# Patient Record
Sex: Female | Born: 1990 | Hispanic: Yes | Marital: Single | Smoking: Never smoker
Health system: Southern US, Community
[De-identification: ages and names within clinical notes are randomized; demographics above are authoritative.]

## PROBLEM LIST (undated history)

## (undated) ENCOUNTER — Inpatient Hospital Stay (HOSPITAL_COMMUNITY): Payer: Self-pay

## (undated) DIAGNOSIS — Z789 Other specified health status: Secondary | ICD-10-CM

## (undated) DIAGNOSIS — K602 Anal fissure, unspecified: Secondary | ICD-10-CM

## (undated) DIAGNOSIS — Z3490 Encounter for supervision of normal pregnancy, unspecified, unspecified trimester: Secondary | ICD-10-CM

## (undated) DIAGNOSIS — R87619 Unspecified abnormal cytological findings in specimens from cervix uteri: Secondary | ICD-10-CM

## (undated) HISTORY — DX: Encounter for supervision of normal pregnancy, unspecified, unspecified trimester: Z34.90

## (undated) HISTORY — PX: NO PAST SURGERIES: SHX2092

## (undated) HISTORY — DX: Unspecified abnormal cytological findings in specimens from cervix uteri: R87.619

## (undated) HISTORY — DX: Anal fissure, unspecified: K60.2

## (undated) HISTORY — DX: Other specified health status: Z78.9

---
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
## 2009-05-18 ENCOUNTER — Emergency Department (HOSPITAL_COMMUNITY): Admission: EM | Admit: 2009-05-18 | Discharge: 2009-05-18 | Payer: Self-pay | Admitting: Emergency Medicine

## 2011-03-08 HISTORY — PX: COLPOSCOPY: SHX161

## 2011-03-25 ENCOUNTER — Other Ambulatory Visit: Payer: Self-pay | Admitting: Nurse Practitioner

## 2011-03-25 ENCOUNTER — Other Ambulatory Visit (HOSPITAL_COMMUNITY)
Admission: RE | Admit: 2011-03-25 | Discharge: 2011-03-25 | Disposition: A | Discharge status: Home / Self Care | Payer: Self-pay | Source: Ambulatory Visit | Attending: Family Medicine | Admitting: Family Medicine

## 2011-03-25 ENCOUNTER — Other Ambulatory Visit (HOSPITAL_COMMUNITY)
Admission: RE | Admit: 2011-03-25 | Discharge: 2011-03-25 | Disposition: A | Discharge status: Home / Self Care | Payer: Self-pay | Source: Ambulatory Visit | Attending: Unknown Physician Specialty | Admitting: Unknown Physician Specialty

## 2011-03-25 DIAGNOSIS — N870 Mild cervical dysplasia: Secondary | ICD-10-CM | POA: Insufficient documentation

## 2011-03-25 DIAGNOSIS — R87612 Low grade squamous intraepithelial lesion on cytologic smear of cervix (LGSIL): Secondary | ICD-10-CM | POA: Insufficient documentation

## 2012-07-04 ENCOUNTER — Encounter (HOSPITAL_COMMUNITY): Payer: Self-pay

## 2012-07-04 ENCOUNTER — Emergency Department (HOSPITAL_COMMUNITY)
Admission: EM | Admit: 2012-07-04 | Discharge: 2012-07-04 | Disposition: A | Discharge status: Home / Self Care | Payer: Self-pay | Attending: Emergency Medicine | Admitting: Emergency Medicine

## 2012-07-04 DIAGNOSIS — O2390 Unspecified genitourinary tract infection in pregnancy, unspecified trimester: Secondary | ICD-10-CM | POA: Insufficient documentation

## 2012-07-04 DIAGNOSIS — N390 Urinary tract infection, site not specified: Secondary | ICD-10-CM | POA: Insufficient documentation

## 2012-07-04 DIAGNOSIS — Z3201 Encounter for pregnancy test, result positive: Secondary | ICD-10-CM | POA: Insufficient documentation

## 2012-07-04 DIAGNOSIS — Z3490 Encounter for supervision of normal pregnancy, unspecified, unspecified trimester: Secondary | ICD-10-CM

## 2012-07-04 HISTORY — DX: Encounter for supervision of normal pregnancy, unspecified, unspecified trimester: Z34.90

## 2012-07-04 LAB — CBC WITH DIFFERENTIAL/PLATELET
Basophils Relative: 0 % (ref 0–1)
Eosinophils Absolute: 0.1 10*3/uL (ref 0.0–0.7)
Lymphocytes Relative: 25 % (ref 12–46)
Lymphs Abs: 1.9 10*3/uL (ref 0.7–4.0)
MCH: 29.8 pg (ref 26.0–34.0)
MCV: 88.9 fL (ref 78.0–100.0)
Neutro Abs: 5.1 10*3/uL (ref 1.7–7.7)
RBC: 3.69 MIL/uL — ABNORMAL LOW (ref 3.87–5.11)

## 2012-07-04 LAB — URINE MICROSCOPIC-ADD ON

## 2012-07-04 LAB — HCG, QUANTITATIVE, PREGNANCY: hCG, Beta Chain, Quant, S: 24118 m[IU]/mL — ABNORMAL HIGH (ref <5)

## 2012-07-04 LAB — BASIC METABOLIC PANEL
Calcium: 8.9 mg/dL (ref 8.4–10.5)
Chloride: 100 mEq/L (ref 96–112)
Creatinine, Ser: 0.52 mg/dL (ref 0.50–1.10)
GFR calc Af Amer: >90 mL/min (ref >90)
GFR calc non Af Amer: >90 mL/min (ref >90)
Potassium: 3.2 mEq/L — ABNORMAL LOW (ref 3.5–5.1)

## 2012-07-04 LAB — URINALYSIS, ROUTINE W REFLEX MICROSCOPIC
Bilirubin Urine: NEGATIVE
Ketones, ur: NEGATIVE mg/dL

## 2012-07-04 MED ORDER — CEPHALEXIN 500 MG PO CAPS: 500.0000 mg | ORAL_CAPSULE | Freq: Four times a day (QID) | ORAL | Status: DC

## 2012-07-04 MED ORDER — CEPHALEXIN 500 MG PO CAPS
1000.0000 mg | ORAL_CAPSULE | Freq: Once | ORAL | Status: AC
  Administered 2012-07-04: 1000 mg via ORAL
  Filled 2012-07-04: qty 2

## 2012-07-04 MED ORDER — MAGNESIUM CITRATE PO SOLN
1.0000 | Freq: Once | ORAL | Status: AC
  Administered 2012-07-04: 1 via ORAL
  Filled 2012-07-04: qty 296

## 2012-07-04 NOTE — ED Provider Notes (Signed)
History   This chart was scribed for Donnetta Hutching, MD by Leone Payor, ED Scribe. This patient was seen in room APA02/APA02 and the patient's care was started at 1922.   CSN: [id number]  Arrival date & time 07/04/12  1531   First MD Initiated Contact with Patient 07/04/12 1922      Chief Complaint  Patient presents with  . Constipation    (Consider location/radiation/quality/duration/timing/severity/associated sxs/prior treatment) The history is provided by the patient. No language interpreter was used.    Samantha Khan is a pregnany 21 y.o. female who presents to the Emergency Department complaining of new constipation starting 3 days ago. Last menstrual period 04/13 and she reports taking the morning after pill 08/13 after a sexual encounter. Pt reports gaining 8-9 lbs recently. Health department recently told pt that she was pregnant. Pt has pain in her right lower abdomen. She denies dysuria, fever, chills   Pt denies smoking and alcohol use. Past Medical History  Diagnosis Date  . Pregnancy     History reviewed. No pertinent past surgical history.  No family history on file.  History  Substance Use Topics  . Smoking status: Never Smoker   . Smokeless tobacco: Not on file  . Alcohol Use: No    OB History    Grav Para Term Preterm Abortions TAB SAB Ect Mult Living   1               Review of Systems  A complete 10 system review of systems was obtained and all systems are negative except as noted in the HPI and PMH.    Allergies  Review of patient's allergies indicates no known allergies.  Home Medications  No current outpatient prescriptions on file.  BP 126/75  Pulse 94  Temp 97.2 F (36.2 C) (Oral)  Resp 24  Ht 5' (1.524 m)  Wt 143 lb (64.864 kg)  BMI 27.93 kg/m2  SpO2 100%  LMP 03/01/2012  Physical Exam  Nursing note and vitals reviewed. Constitutional: She is oriented to person, place, and time. She appears well-developed and well-nourished.   HENT:  Head: Normocephalic and atraumatic.  Eyes: Conjunctivae normal and EOM are normal. Pupils are equal, round, and reactive to light.  Neck: Normal range of motion. Neck supple.  Cardiovascular: Normal rate, regular rhythm and normal heart sounds.   Pulmonary/Chest: Effort normal and breath sounds normal.  Abdominal: Soft. Bowel sounds are normal.  Musculoskeletal: Normal range of motion.  Neurological: She is alert and oriented to person, place, and time.  Skin: Skin is warm and dry.  Psychiatric: She has a normal mood and affect.    ED Course  Procedures (including critical care time)  DIAGNOSTIC STUDIES: Oxygen Saturation is 100% on room air, normal by my interpretation.    COORDINATION OF CARE:  7:26 PM Discussed treatment plan which includes blood work and pregnancy test with pt at bedside and pt agreed to plan.   9:57 PM Advised pt of lab work results. Discussed discharge plan which includes antibiotics and follow up for ultrasound with pt and pt agreed to plan.    Labs Reviewed  URINALYSIS, ROUTINE W REFLEX MICROSCOPIC  PREGNANCY, URINE   Results for orders placed during the hospital encounter of 07/04/12  URINALYSIS, ROUTINE W REFLEX MICROSCOPIC      Component Value Range   Color, Urine YELLOW  YELLOW   APPearance HAZY (*) CLEAR   Specific Gravity, Urine 1.020  1.005 - 1.030   pH 6.0  5.0 - 8.0   Glucose, UA NEGATIVE  NEGATIVE mg/dL   Hgb urine dipstick NEGATIVE  NEGATIVE   Bilirubin Urine NEGATIVE  NEGATIVE   Ketones, ur NEGATIVE  NEGATIVE mg/dL   Protein, ur NEGATIVE  NEGATIVE mg/dL   Urobilinogen, UA 0.2  0.0 - 1.0 mg/dL   Nitrite NEGATIVE  NEGATIVE   Leukocytes, UA SMALL (*) NEGATIVE  PREGNANCY, URINE      Component Value Range   Preg Test, Ur POSITIVE (*) NEGATIVE  URINE MICROSCOPIC-ADD ON      Component Value Range   Squamous Epithelial / LPF MANY (*) RARE   WBC, UA 11-20  <3 WBC/hpf   RBC / HPF 0-2  <3 RBC/hpf   Bacteria, UA MANY (*) RARE    CBC WITH DIFFERENTIAL      Component Value Range   WBC 7.6  4.0 - 10.5 K/uL   RBC 3.69 (*) 3.87 - 5.11 MIL/uL   Hemoglobin 11.0 (*) 12.0 - 15.0 g/dL   HCT 16.1 (*) 09.6 - 04.5 %   MCV 88.9  78.0 - 100.0 fL   MCH 29.8  26.0 - 34.0 pg   MCHC 33.5  30.0 - 36.0 g/dL   RDW 40.9  81.1 - 91.4 %   Platelets 254  150 - 400 K/uL   Neutrophils Relative 66  43 - 77 %   Neutro Abs 5.1  1.7 - 7.7 K/uL   Lymphocytes Relative 25  12 - 46 %   Lymphs Abs 1.9  0.7 - 4.0 K/uL   Monocytes Relative 7  3 - 12 %   Monocytes Absolute 0.5  0.1 - 1.0 K/uL   Eosinophils Relative 2  0 - 5 %   Eosinophils Absolute 0.1  0.0 - 0.7 K/uL   Basophils Relative 0  0 - 1 %   Basophils Absolute 0.0  0.0 - 0.1 K/uL  BASIC METABOLIC PANEL      Component Value Range   Sodium 133 (*) 135 - 145 mEq/L   Potassium 3.2 (*) 3.5 - 5.1 mEq/L   Chloride 100  96 - 112 mEq/L   CO2 24  19 - 32 mEq/L   Glucose, Bld 89  70 - 99 mg/dL   BUN 8  6 - 23 mg/dL   Creatinine, Ser 7.82  0.50 - 1.10 mg/dL   Calcium 8.9  8.4 - 95.6 mg/dL   GFR calc non Af Amer >90  >90 mL/min   GFR calc Af Amer >90  >90 mL/min  HCG, QUANTITATIVE, PREGNANCY      Component Value Range   hCG, Beta Chain, Quant, S 24118 (*) <5 mIU/mL   No results found.   No diagnosis found.    MDM  No acute abdomen.  Urinalysis reveals 11-20 white cells. Rx Keflex for one week. Pregnancy test positive. we'll obtain ultrasound Monday 3:15. No clinical evidence of ectopic.  Magnesium citrate for constipation      I personally performed the services described in this documentation, which was scribed in my presence. The recorded information has been reviewed and is accurate.    Donnetta Hutching, MD 07/04/12 2251

## 2012-07-04 NOTE — ED Notes (Signed)
Ultrasound appointment for 1515 tomorrow.

## 2012-07-04 NOTE — ED Notes (Signed)
Pt reports no bm x3 days. Pt is also pregnant. Unsure of her due date, just found out 1 week ago and stopped taking her laxatives.

## 2012-07-05 ENCOUNTER — Other Ambulatory Visit (HOSPITAL_COMMUNITY): Payer: Self-pay | Admitting: Emergency Medicine

## 2012-07-05 ENCOUNTER — Ambulatory Visit (HOSPITAL_COMMUNITY)
Admit: 2012-07-05 | Discharge: 2012-07-05 | Disposition: A | Discharge status: Home / Self Care | Payer: Self-pay | Attending: Emergency Medicine | Admitting: Emergency Medicine

## 2012-07-05 DIAGNOSIS — Z3490 Encounter for supervision of normal pregnancy, unspecified, unspecified trimester: Secondary | ICD-10-CM

## 2012-07-05 DIAGNOSIS — Z331 Pregnant state, incidental: Secondary | ICD-10-CM | POA: Insufficient documentation

## 2012-07-05 LAB — URINE CULTURE
Colony Count: NO GROWTH
Culture: NO GROWTH

## 2012-07-07 NOTE — L&D Delivery Note (Signed)
Delivery Note At 1:07 AM a viable and healthy female was delivered via Vaginal, Spontaneous Delivery (Presentation: ; Occiput Anterior).  APGAR: 8, 9; weight 8 lb 14 oz (4026 g).   Placenta status: Intact, Spontaneous.  Cord: 3 vessels with the following complications: None.   Anesthesia: Epidural  Episiotomy: None Lacerations: 2nd degree;Perineal Suture Repair: 3.0 vicryl Est. Blood Loss (mL):   Mom to postpartum.  Baby to nursery-stable.  Tawnya Crook 12/02/2012, 1:34 AM

## 2012-07-20 LAB — OB RESULTS CONSOLE ANTIBODY SCREEN: Antibody Screen: NEGATIVE

## 2012-07-20 LAB — OB RESULTS CONSOLE ABO/RH: RH Type: POSITIVE

## 2012-07-20 LAB — OB RESULTS CONSOLE RUBELLA ANTIBODY, IGM: Rubella: IMMUNE

## 2012-07-20 LAB — OB RESULTS CONSOLE GC/CHLAMYDIA: Gonorrhea: NEGATIVE

## 2012-07-20 LAB — OB RESULTS CONSOLE HEPATITIS B SURFACE ANTIGEN: Hepatitis B Surface Ag: NEGATIVE

## 2012-09-14 ENCOUNTER — Encounter: Payer: Self-pay | Admitting: *Deleted

## 2012-09-21 ENCOUNTER — Ambulatory Visit (INDEPENDENT_AMBULATORY_CARE_PROVIDER_SITE_OTHER): Payer: Self-pay | Admitting: Advanced Practice Midwife

## 2012-09-21 ENCOUNTER — Encounter: Payer: Self-pay | Admitting: Advanced Practice Midwife

## 2012-09-21 VITALS — BP 94/50 | Wt 163.6 lb

## 2012-09-21 DIAGNOSIS — O0930 Supervision of pregnancy with insufficient antenatal care, unspecified trimester: Secondary | ICD-10-CM

## 2012-09-21 DIAGNOSIS — O0932 Supervision of pregnancy with insufficient antenatal care, second trimester: Secondary | ICD-10-CM

## 2012-09-21 DIAGNOSIS — O99019 Anemia complicating pregnancy, unspecified trimester: Secondary | ICD-10-CM

## 2012-09-21 DIAGNOSIS — Z3492 Encounter for supervision of normal pregnancy, unspecified, second trimester: Secondary | ICD-10-CM

## 2012-09-21 LAB — GLUCOSE TOLERANCE, 2 HOURS
Glucose, GTT - 1 Hour: 123 mg/dL (ref ?–200)
Glucose, GTT - 2 Hour: 128 mg/dL (ref ?–140)

## 2012-09-21 LAB — POCT URINALYSIS DIPSTICK

## 2012-09-21 NOTE — Progress Notes (Signed)
Pt reports good movement.  No c/o at this time.  Routine questions about pregnancy andswered.  F/U in 2 weeks for Normal OBV.

## 2012-10-05 ENCOUNTER — Encounter: Payer: Self-pay | Admitting: Advanced Practice Midwife

## 2012-10-05 ENCOUNTER — Ambulatory Visit (INDEPENDENT_AMBULATORY_CARE_PROVIDER_SITE_OTHER): Payer: Self-pay | Admitting: Advanced Practice Midwife

## 2012-10-05 VITALS — BP 90/58 | Wt 165.6 lb

## 2012-10-05 DIAGNOSIS — Z3400 Encounter for supervision of normal first pregnancy, unspecified trimester: Secondary | ICD-10-CM | POA: Insufficient documentation

## 2012-10-05 DIAGNOSIS — Z3403 Encounter for supervision of normal first pregnancy, third trimester: Secondary | ICD-10-CM

## 2012-10-05 DIAGNOSIS — O0930 Supervision of pregnancy with insufficient antenatal care, unspecified trimester: Secondary | ICD-10-CM

## 2012-10-05 NOTE — Progress Notes (Signed)
Unable to void for urine specimen.     No c/o at this time.  Routine questions about pregnancy answered.  F/U in 2 weeks for routing prenatal visit. Requests more prenatal vitamin samples.

## 2012-10-05 NOTE — Progress Notes (Signed)
Pressure in belly when baby moves.

## 2012-10-07 ENCOUNTER — Ambulatory Visit (INDEPENDENT_AMBULATORY_CARE_PROVIDER_SITE_OTHER): Payer: Self-pay | Admitting: Obstetrics & Gynecology

## 2012-10-07 ENCOUNTER — Telehealth: Payer: Self-pay | Admitting: Obstetrics & Gynecology

## 2012-10-07 ENCOUNTER — Encounter: Payer: Self-pay | Admitting: Advanced Practice Midwife

## 2012-10-07 VITALS — BP 102/60 | Wt 165.2 lb

## 2012-10-07 DIAGNOSIS — O4690 Antepartum hemorrhage, unspecified, unspecified trimester: Secondary | ICD-10-CM

## 2012-10-07 DIAGNOSIS — O4693 Antepartum hemorrhage, unspecified, third trimester: Secondary | ICD-10-CM

## 2012-10-07 DIAGNOSIS — O2390 Unspecified genitourinary tract infection in pregnancy, unspecified trimester: Secondary | ICD-10-CM

## 2012-10-07 DIAGNOSIS — B3731 Acute candidiasis of vulva and vagina: Secondary | ICD-10-CM

## 2012-10-07 DIAGNOSIS — B373 Candidiasis of vulva and vagina: Secondary | ICD-10-CM

## 2012-10-07 LAB — POCT URINALYSIS DIPSTICK
Glucose, UA: NEGATIVE
Ketones, UA: NEGATIVE
Nitrite, UA: NEGATIVE

## 2012-10-07 MED ORDER — FLUCONAZOLE 150 MG PO TABS: 150.0000 mg | ORAL_TABLET | Freq: Once | ORAL | Status: DC

## 2012-10-07 NOTE — Progress Notes (Signed)
Pt had pink spotting yesterday and this am, now brown.  Denies vaginal itiching/irritation.  SSE:  Scant brown blood; discharge c/w yeast infection.  CX LTC.  WIll treat with Diflucan.

## 2012-10-07 NOTE — Progress Notes (Signed)
Vaginal bleeding started yest. afternoon. Started out red, but today it's brown. No recent BM or intercourse.

## 2012-10-07 NOTE — Patient Instructions (Signed)
Infeccin por cndida en adultos (Candida Infection, Adult) Una infeccin por Cndida (tambin llamado infeccin por hongos levaduriformes, o infeccin por Monilia) es un crecimiento excesivo de hongos que puede ocurrir en cualquier parte del cuerpo. Una infeccin por cndida comnmente ocurre en las zonas ms calientes y hmedas del cuerpo. Usualmente, la infeccin permanece localizada pero puede diseminarse y convertirse en una infeccin sistmica. Una infeccin por cndida puede ser signo de un trastorno ms grave como diabetes, leucemia, o SIDA. Una infeccin por cndida puede ocurrir tanto en hombres como en mujeres. En mujeres, la vaginitis Cndida es una infeccin vaginal. Se trata de una de las causas ms frecuentes de la vaginitis. Los hombres no suelen tener sntomas [place] que se le aparecen otros problemas. Pueden descubrir que tienen una infeccin por hongos porque su compaera sexual los tiene. Es ms probable que los hombres no circuncisos adquieran una infeccin por hongo que aquellos que estn circuncindados. Esto se debe a que el glande no circuncidado no est expuesto al aire y no se mantiene tan seco como un glande circuncidado. Los ONEOKadultos mayores pueden desarrollar infecciones por cndida en la zona de la dentadura. CAUSAS Mujeres  Antibiticos.  Medicamento con esteroides Network engineerdurante mucho tiempo.  Tener sobrepeso (obesidad).  Diabetes.  Sistema inmune deficiente.  Ciertas enfermedades.  Medicamentos inmunosupresores para pacientes con trasplante de rganos.  Quimioterapia.  El Morris Plainsembarazo.  Menstruacin.  Estrs o fatiga.  Consumo de drogas de forma intravenosa.  Anticonceptivos orales.  Utilizar ropa ajustada en la zona de la entrepierna.  Contagio a partir de un compaero sexual que ya tiene la infeccin.  Los espermicidas.  Catteres intravenosos, urinarios, u de otro tipo. Hombres  Contagio a partir de una compaera sexual que ya tiene la  infeccin.  Tener sexo oral o anal con una persona que tiene la infeccin.  Los espermicidas.  Diabetes.  Antibiticos.  Sistema inmune deficiente.  Medicamentos que suprimen el sistema inmune.  El uso de drogas por va intravenosa.  Intravenosa, urinarias o catteres otros. SNTOMAS Mujeres  Flujo vaginal espeso y blanco.  Picazn vaginal.  Enrojecimiento e hinchazn en la zona de la vagina.  Irritacin de los labios de la vagina y perineo.  lceras en labios vaginales y perineo.  Relaciones sexuales dolorosas.  Bajo nivel de Bankerazcar en la sangre (hipoglucemia).  Dolor al Beatrix Shipperorinar.  Infecciones en la vejiga.  Problemas intestinales como constipacin, indigestin, mal aliento, hinchazn, gases, diarrea o heces blandas. Hombres  Primero los hombres desarrollan problemas intestinales como constipacin, indigestin, mal aliento, hinchazn, gases, diarrea o heces blandas.  Piel del pene seca y Sudanquebradiza con picazn o molestias.  Prurito de Estate agentla ingle.  Piel seca y escamosa.  Pie de atleta.  Hipoglucemia. DIAGNSTICO Mujeres  Se revisa el historial y se realiza un anlisis.  La supuracin se observa con un microscopio.  Deber tomarse un cultivo de Administrator, artsla secrecin. Hombres  Se revisa el historial y se realiza un anlisis.  Se observar cualquier secrecin proveniente del pene y la piel quebrada en el microscopio y se Education officer, environmentalrealizar un cultivo.  Podrn realizarle cultivos de heces. TRATAMIENTO Mujeres  Supositorios y cremas antifngicos vaginales.  Cremas medicadas para disminuir la picazn e irritacin de la zona externa de la vagina.  Aplique una bolsa caliente en la zona del perineo para disminuir molestias o inflamacin.  Medicamentos antifngicos orales.  Supositorios vaginales o cremas medicinales, para infecciones repetidas o recurrentes.  Lave y seque la zona por completo antes de Comptrolleraplicar la crema.  La  ingesta de yogur con lactobacillus le  ayudar con la prevencin y Scientist, research (medical).  Si las cremas y supositorios no funcionan, la aplicacin de solucin violeta de genciana puede ayudar. Hombres  Cremas anti hongos y medicamentos orales anti hongos.  A veces el tratamiento deber continuar por 30 das despus de que los sntomas desaparecen para prevenir la recurrencia. INSTRUCCIONES PARA EL CUIDADO DOMICILIARIO Mujeres  Utilice ropa interior de algodn y evite las ropas ajustadas.  Evite el papel higinico de color o perfumado y los tampones o toallitas con desodorante.  No utilice duchas vaginales.  Mantenga la diabetes bajo control.  Tome todos los medicamentos tal como se le indic.  Mantenga su piel limpia y [place].  Consuma leche o yogur con lactobacillus de [place] regular. Si contrae infecciones por levaduras frecuentes y cree saber cul es la infeccin, existen medicamentos de [place]. Si la infeccin no parece curarse en 3 das, hable con el mdico.  Comunique a su compaero sexual que padece una infeccin por hongos. El tambin Network engineer, en especial si la infeccin no desaparece o es recurrente. Hombres  Mantenga su piel limpia y [place].  Mantenga la diabetes bajo control.  Tome todos los medicamentos tal como se le indic.  Comunique a su compaera sexual que sufre una infeccin por hongos. SOLICITE ANTENCIN MDICA SI:  Los sntomas no desaparecen o empeoran luego de 1 semana de tratamiento.  Usted tiene una temperatura oral de ms de 102 F (38.9 C).  Presenta dificultades para tragar o para comer durante un tiempo prolongado.  Aparecen ampollas en los genitales.  Si aparece una hemorragia vaginal y no es el momento del perodo.  Siente dolor abdominal.  Presentara problemas intestinales como los ya descritos.  Si se siente dbil o desfalleciente.  Siente dolor al Geographical information systems officer u observa una mayor cantidad [place].  Tiene dolor durante las [employer]. ASEGRESE DE  QUE:  Comprende esas instrucciones para el alta mdica.  Controlar su enfermedad.  Pedir ayuda de inmediato si no mejora o empeora. Document Released: 06/23/2005 Document Revised: 09/15/2011 [facility] Patient Information 2013 [place], [place].

## 2012-10-20 ENCOUNTER — Encounter: Payer: Self-pay | Admitting: Advanced Practice Midwife

## 2012-10-20 ENCOUNTER — Ambulatory Visit (INDEPENDENT_AMBULATORY_CARE_PROVIDER_SITE_OTHER): Payer: Self-pay | Admitting: Advanced Practice Midwife

## 2012-10-20 VITALS — BP 108/68 | Wt 170.0 lb

## 2012-10-20 DIAGNOSIS — Z3400 Encounter for supervision of normal first pregnancy, unspecified trimester: Secondary | ICD-10-CM

## 2012-10-20 DIAGNOSIS — O99019 Anemia complicating pregnancy, unspecified trimester: Secondary | ICD-10-CM

## 2012-10-20 LAB — POCT URINALYSIS DIPSTICK
Blood, UA: 1
Glucose, UA: NEGATIVE
Nitrite, UA: NEGATIVE

## 2012-10-20 NOTE — Progress Notes (Signed)
No c/o at this time.  Routine questions about pregnancy answered.  F/U in 1.5 weeks for LROB and GBS.

## 2012-11-02 ENCOUNTER — Ambulatory Visit (INDEPENDENT_AMBULATORY_CARE_PROVIDER_SITE_OTHER): Payer: Self-pay | Admitting: Women's Health

## 2012-11-02 ENCOUNTER — Encounter: Payer: Self-pay | Admitting: Women's Health

## 2012-11-02 VITALS — BP 100/50 | Wt 169.6 lb

## 2012-11-02 DIAGNOSIS — O0930 Supervision of pregnancy with insufficient antenatal care, unspecified trimester: Secondary | ICD-10-CM

## 2012-11-02 DIAGNOSIS — Z3403 Encounter for supervision of normal first pregnancy, third trimester: Secondary | ICD-10-CM

## 2012-11-02 DIAGNOSIS — Z331 Pregnant state, incidental: Secondary | ICD-10-CM

## 2012-11-02 DIAGNOSIS — Z1389 Encounter for screening for other disorder: Secondary | ICD-10-CM

## 2012-11-02 LAB — POCT URINALYSIS DIPSTICK
Glucose, UA: NEGATIVE
Nitrite, UA: NEGATIVE
Protein, UA: NEGATIVE

## 2012-11-02 NOTE — Addendum Note (Signed)
Addended by: Colen Darling on: 11/02/2012 03:49 PM   Modules accepted: Orders

## 2012-11-02 NOTE — Progress Notes (Signed)
Reports good fm. Denies uc's, lof, vb, urinary frequency, urgency, hesitancy, or dysuria.  Reports occasional pain in pelvis w/ fetal movement.  Reassured pt. Reviewed labor s/s and fetal kick counts. GBS and gc/ch collected today. All questions answered. F/u 1 wk for LROB

## 2012-11-02 NOTE — Progress Notes (Signed)
Pain in low belly.

## 2012-11-02 NOTE — Patient Instructions (Signed)
Contracciones de Braxton Hicks (Braxton Hicks Contractions) Usted presenta un falso trabajo de parto. Durante todo el embarazo aparecen con frecuencia contracciones del tero. Hacia el final del embarazo (32-34 semanas) estas contracciones (Braxton Hicks) pueden hacerse ms fuertes. No se trata de un trabajo de parto verdadero porque no producen un agrandamiento (dilatacin) y afinamiento del cuello del tero. Algunas veces resulta difcil distinguirlas del trabajo de parto verdadero porque en algunos casos llegan a ser muy intensas y las personas tienen distinta tolerancia al dolor. No debe sentirse avergonzada si ingresa al hospital con un falso trabajo de parto. En ocasiones la nica forma de saber si est en un parto verdadero es observar los cambios en el cuello del tero. A veces, la nica forma de saber si realmente est en trabajo de parto es para el mdico observar los cambios en el tero. Como diferenciar el trabajo de parto falso del verdadero:  Trabajos de parto falso.  Las contracciones falsas generalmente duran menos y no son tan intensas como las verdaderas.  Generalmente se sienten en la zona inferior del abdomen y en la ingle.  Pueden aliviarse con una caminata o cambiar de posicin mientras se est acostada.  A medida que pasa el tiempo son ms cortas y dbiles.  Generalmente son irregulares.  No se hacen progresivamente ms intensas y cercanas entre s como las verdaderas.  Trabajo de parto verdadero.  Las contracciones verdaderas duran de 30 a 70 segundos, son ms regulares, generalmente se hacen ms intensas y aumentan en frecuencia.  No desaparecen al caminar.  La molestia generalmente se siente en la parte superior del tero y se extiende hacia la zona inferior del abdomen y hacia la cintura.  El profesional que la asiste podr examinarla para determinar si el trabajo de parto es verdadero. El examen mostrar si el cuello del tero se est dilatando y afinando. Si  no hay problemas prenatales u otras complicaciones de la salud asociadas al embarazo, no habr inconvenientes si la envan a su casa y espera el comienzo del verdadero trabajo de parto. INSTRUCCIONES PARA EL CUIDADO DOMICILIARIO  Siga con los ejercicios y las indicaciones habituales.  Tome los medicamentos como se le indic.  Cumpla con las citas regularmente.  Coma y beba ligero si cree que dar a luz.  Si se siente incmoda por las contracciones:  Cambie de actividad, si est acostada o en reposo, camine y si est caminando, repose.  Sintense y repose en una baadera con agua caliente.  Beba entre 2 y 3 vasos de agua. La deshidratacin puede causar contracciones BH.  Respire lenta y profundamente varias veces por hora. SOLICITE ATENCIN MDICA DE INMEDIATO SI:  Las contracciones se intensifican, se hacen ms regulares y cercanas entre s.  Tiene una prdida importante de lquido de la vagina  La temperatura oral se eleva sin motivo por encima de 102 F (38.9 C) o segn le indique el profesional que la asiste.  Elimina una mucosidad sanguinolenta.  Presenta hemorragia vaginal.  Presenta dolor abdominal constante.  Siente un dolor en la parte baja de la espalda que nunca haba sentido antes.  Siente que el beb empuja hacia abajo y le causa presin plvica.  El beb no se mueve tanto como antes. Document Released: 04/02/2005 Document Revised: 09/15/2011 Exit[employer] Patient Information 2013 [employer].  

## 2012-11-03 LAB — GC/CHLAMYDIA PROBE AMP
CT Probe RNA: NEGATIVE
GC Probe RNA: NEGATIVE

## 2012-11-09 ENCOUNTER — Encounter: Payer: Self-pay | Admitting: Women's Health

## 2012-11-09 ENCOUNTER — Ambulatory Visit (INDEPENDENT_AMBULATORY_CARE_PROVIDER_SITE_OTHER): Payer: Self-pay | Admitting: Women's Health

## 2012-11-09 VITALS — BP 112/62 | Wt 168.8 lb

## 2012-11-09 DIAGNOSIS — Z331 Pregnant state, incidental: Secondary | ICD-10-CM

## 2012-11-09 DIAGNOSIS — Z3403 Encounter for supervision of normal first pregnancy, third trimester: Secondary | ICD-10-CM

## 2012-11-09 DIAGNOSIS — O0930 Supervision of pregnancy with insufficient antenatal care, unspecified trimester: Secondary | ICD-10-CM

## 2012-11-09 DIAGNOSIS — Z1389 Encounter for screening for other disorder: Secondary | ICD-10-CM

## 2012-11-09 LAB — POCT URINALYSIS DIPSTICK
Ketones, UA: NEGATIVE
Protein, UA: NEGATIVE

## 2012-11-09 NOTE — Progress Notes (Signed)
Pain and pressure in lower belly.

## 2012-11-09 NOTE — Patient Instructions (Addendum)
Braxton Hicks Contractions  Pregnancy is commonly associated with contractions of the uterus throughout the pregnancy. Towards the end of pregnancy (32 to 34 weeks), these contractions (Braxton Hicks) can develop more often and may become more forceful. This is not true labor because these contractions do not result in opening (dilatation) and thinning of the cervix. They are sometimes difficult to tell apart from true labor because these contractions can be forceful and people have different pain tolerances. You should not feel embarrassed if you go to the hospital with false labor. Sometimes, the only way to tell if you are in true labor is for your caregiver to follow the changes in the cervix.  How to tell the difference between true and false labor:  · False labor.  · The contractions of false labor are usually shorter, irregular and not as hard as those of true labor.  · They are often felt in the front of the lower abdomen and in the groin.  · They may leave with walking around or changing positions while lying down.  · They get weaker and are shorter lasting as time goes on.  · These contractions are usually irregular.  · They do not usually become progressively stronger, regular and closer together as with true labor.  · True labor.  · Contractions in true labor last 30 to 70 seconds, become very regular, usually become more intense, and increase in frequency.  · They do not go away with walking.  · The discomfort is usually felt in the top of the uterus and spreads to the lower abdomen and low back.  · True labor can be determined by your caregiver with an exam. This will show that the cervix is dilating and getting thinner.  If there are no prenatal problems or other health problems associated with the pregnancy, it is completely safe to be sent home with false labor and await the onset of true labor.  HOME CARE INSTRUCTIONS   · Keep up with your usual exercises and instructions.  · Take medications as  directed.  · Keep your regular prenatal appointment.  · Eat and drink lightly if you think you are going into labor.  · If BH contractions are making you uncomfortable:  · Change your activity position from lying down or resting to walking/walking to resting.  · Sit and rest in a tub of warm water.  · Drink 2 to 3 glasses of water. Dehydration may cause B-H contractions.  · Do slow and deep breathing several times an hour.  SEEK IMMEDIATE MEDICAL CARE IF:   · Your contractions continue to become stronger, more regular, and closer together.  · You have a gushing, burst or leaking of fluid from the vagina.  · An oral temperature above 102° F (38.9° C) develops.  · You have passage of blood-tinged mucus.  · You develop vaginal bleeding.  · You develop continuous belly (abdominal) pain.  · You have low back pain that you never had before.  · You feel the baby's head pushing down causing pelvic pressure.  · The baby is not moving as much as it used to.  Document Released: 06/23/2005 Document Revised: 09/15/2011 Document Reviewed: 12/15/2008  ExitCare® Patient Information ©2013 [employer].

## 2012-11-09 NOTE — Progress Notes (Addendum)
Reports good fm. Denies regular/painful uc's, lof, vb, urinary frequency, urgency, hesitancy, or dysuria.  No complaints.  Reviewed gbs results. Reviewed labor s/s and fetal kick counts. All questions answered. F/U 1wk for visit.

## 2012-11-17 ENCOUNTER — Encounter: Payer: Self-pay | Admitting: Obstetrics and Gynecology

## 2012-11-17 ENCOUNTER — Encounter: Payer: Self-pay | Admitting: Obstetrics & Gynecology

## 2012-11-17 ENCOUNTER — Ambulatory Visit (INDEPENDENT_AMBULATORY_CARE_PROVIDER_SITE_OTHER): Payer: Self-pay | Admitting: Obstetrics and Gynecology

## 2012-11-17 VITALS — BP 100/70 | Wt 170.2 lb

## 2012-11-17 DIAGNOSIS — O0930 Supervision of pregnancy with insufficient antenatal care, unspecified trimester: Secondary | ICD-10-CM

## 2012-11-17 DIAGNOSIS — Z1389 Encounter for screening for other disorder: Secondary | ICD-10-CM

## 2012-11-17 LAB — POCT URINALYSIS DIPSTICK: Ketones, UA: NEGATIVE

## 2012-11-17 NOTE — Progress Notes (Signed)
Pt here today for routine visit. Pt denies any problems at this time.good fm, contractions mild , irregular no ROM sx.

## 2012-11-24 ENCOUNTER — Encounter: Payer: Self-pay | Admitting: Obstetrics & Gynecology

## 2012-11-24 ENCOUNTER — Ambulatory Visit (INDEPENDENT_AMBULATORY_CARE_PROVIDER_SITE_OTHER): Payer: Self-pay | Admitting: Obstetrics & Gynecology

## 2012-11-24 VITALS — BP 88/60 | Wt 172.0 lb

## 2012-11-24 DIAGNOSIS — Z1389 Encounter for screening for other disorder: Secondary | ICD-10-CM

## 2012-11-24 DIAGNOSIS — O0930 Supervision of pregnancy with insufficient antenatal care, unspecified trimester: Secondary | ICD-10-CM

## 2012-11-24 DIAGNOSIS — Z331 Pregnant state, incidental: Secondary | ICD-10-CM

## 2012-11-24 DIAGNOSIS — O99019 Anemia complicating pregnancy, unspecified trimester: Secondary | ICD-10-CM

## 2012-11-24 LAB — POCT URINALYSIS DIPSTICK
Glucose, UA: NEGATIVE
Ketones, UA: NEGATIVE
Nitrite, UA: NEGATIVE

## 2012-11-24 NOTE — Progress Notes (Signed)
Pain in belly and back.

## 2012-11-24 NOTE — Progress Notes (Signed)
BP weight and urine results all reviewed and noted. Patient reports good fetal movement, denies any bleeding and no rupture of membranes symptoms or regular contractions. Patient is without complaints. All questions were answered. Induction next week.

## 2012-11-25 ENCOUNTER — Encounter (HOSPITAL_COMMUNITY): Payer: Self-pay | Admitting: *Deleted

## 2012-11-25 ENCOUNTER — Telehealth (HOSPITAL_COMMUNITY): Payer: Self-pay | Admitting: *Deleted

## 2012-11-25 NOTE — Telephone Encounter (Signed)
Preadmission screen Interpreter number [phone]

## 2012-11-25 NOTE — Telephone Encounter (Signed)
Preadmission screen [phone] interpreter number

## 2012-11-30 ENCOUNTER — Inpatient Hospital Stay (HOSPITAL_COMMUNITY)
Admission: RE | Admit: 2012-11-30 | Discharge: 2012-12-03 | DRG: 775 | Disposition: A | Discharge status: Home / Self Care | Payer: Medicaid Other | Source: Ambulatory Visit | Attending: Obstetrics & Gynecology | Admitting: Obstetrics & Gynecology

## 2012-11-30 VITALS — BP 90/48 | HR 85 | Temp 98.6°F | Resp 18 | Ht 60.0 in | Wt 170.0 lb

## 2012-11-30 DIAGNOSIS — O480 Post-term pregnancy: Secondary | ICD-10-CM

## 2012-11-30 DIAGNOSIS — O0932 Supervision of pregnancy with insufficient antenatal care, second trimester: Secondary | ICD-10-CM

## 2012-11-30 LAB — CBC
Hemoglobin: 10.9 g/dL — ABNORMAL LOW (ref 12.0–15.0)
MCH: 27.9 pg (ref 26.0–34.0)
MCHC: 33.2 g/dL (ref 30.0–36.0)
Platelets: 230 10*3/uL (ref 150–400)

## 2012-11-30 MED ORDER — LACTATED RINGERS IV SOLN
INTRAVENOUS | Status: DC
  Administered 2012-11-30 – 2012-12-01 (×4): via INTRAVENOUS

## 2012-11-30 MED ORDER — NALBUPHINE SYRINGE 5 MG/0.5 ML
5.0000 mg | INJECTION | INTRAMUSCULAR | Status: DC | PRN
  Administered 2012-12-01 (×5): 5 mg via INTRAVENOUS
  Filled 2012-11-30 (×6): qty 0.5

## 2012-11-30 MED ORDER — OXYCODONE-ACETAMINOPHEN 5-325 MG PO TABS: 1.0000 | ORAL_TABLET | ORAL | Status: DC | PRN

## 2012-11-30 MED ORDER — LACTATED RINGERS IV SOLN
500.0000 mL | INTRAVENOUS | Status: DC | PRN
  Administered 2012-12-01: 500 mL via INTRAVENOUS

## 2012-11-30 MED ORDER — CITRIC ACID-SODIUM CITRATE 334-500 MG/5ML PO SOLN: 30.0000 mL | ORAL | Status: DC | PRN

## 2012-11-30 MED ORDER — OXYTOCIN BOLUS FROM INFUSION: 500.0000 mL | INTRAVENOUS | Status: DC

## 2012-11-30 MED ORDER — LIDOCAINE HCL (PF) 1 % IJ SOLN
30.0000 mL | INTRAMUSCULAR | Status: DC | PRN
  Filled 2012-11-30 (×2): qty 30

## 2012-11-30 MED ORDER — ONDANSETRON HCL 4 MG/2ML IJ SOLN: 4.0000 mg | Freq: Four times a day (QID) | INTRAMUSCULAR | Status: DC | PRN

## 2012-11-30 MED ORDER — OXYTOCIN 40 UNITS IN LACTATED RINGERS INFUSION - SIMPLE MED
62.5000 mL/h | INTRAVENOUS | Status: DC
  Administered 2012-12-02: 62.5 mL/h via INTRAVENOUS
  Filled 2012-11-30: qty 1000

## 2012-11-30 MED ORDER — ACETAMINOPHEN 325 MG PO TABS
650.0000 mg | ORAL_TABLET | ORAL | Status: DC | PRN
  Administered 2012-12-01: 650 mg via ORAL
  Filled 2012-11-30: qty 2

## 2012-11-30 MED ORDER — MISOPROSTOL 25 MCG QUARTER TABLET
25.0000 ug | ORAL_TABLET | Freq: Once | ORAL | Status: AC
  Administered 2012-11-30: 25 ug via VAGINAL
  Filled 2012-11-30: qty 0.25

## 2012-11-30 MED ORDER — IBUPROFEN 600 MG PO TABS
600.0000 mg | ORAL_TABLET | Freq: Four times a day (QID) | ORAL | Status: DC | PRN
  Administered 2012-12-02: 600 mg via ORAL
  Filled 2012-11-30: qty 1

## 2012-11-30 NOTE — H&P (Signed)
Samantha Khan is a 22 y.o. G1P0 at [redacted]w[redacted]d who presents for IOL due to post dates. Patient reports contractions approximately every 10 mins.  No other complaints currently.  Denies loss of fluid.  Reports some vaginal spotting yesterday (likely bloody show).    Of note, GBS negative, genetic screening (NT) normal, Anatomic [place] normal, GTT 81/123/128.  Maternal Medical History:    OB History   Grav Para Term Preterm Abortions TAB SAB Ect Mult Living   1              Past Medical History  Diagnosis Date  . Pregnancy   . Medical history non-contributory    Past Surgical History  Procedure Laterality Date  . No past surgeries     Family History: family history includes Diabetes in her maternal grandmother, paternal grandfather, and paternal grandmother and Hypertension in her maternal grandmother. Social History:  reports that she has never smoked. She has never used smokeless tobacco. She reports that she does not drink alcohol or use illicit drugs.   Prenatal Transfer Tool  Maternal Diabetes: No Genetic Screening: Normal Maternal Ultrasounds/Referrals: Normal Fetal Ultrasounds or other Referrals:  Fetal echo Maternal Substance Abuse:  No Significant Maternal Medications:  None Significant Maternal Lab Results:  None Other Comments:  None  Review of Systems  Constitutional: Negative for fever.  Eyes: Negative for blurred vision.  Respiratory: Negative for shortness of breath.   Cardiovascular: Negative for chest pain.  Gastrointestinal: Negative for vomiting and abdominal pain.  Neurological: Negative for headaches.     Blood pressure 103/65, pulse 84, temperature 98.1 F (36.7 C), temperature source Oral, resp. rate 18, height 5' (1.524 m), weight 77.111 kg (170 lb). Exam Physical Exam  Gen: well appearing, NAD. Abd: gravid but otherwise soft, nontender to palpation Ext: no appreciable lower extremity edema bilaterally Neuro: no focal deficits GU: normal appearing  external genitalia  Dilation: 1.5 Effacement (%): 50 Station: -2 Presentation: Vertex Exam by:: Dr. Adriana Simas  FHR: baseline 135, mod variability, 15x15 accels, No decels Toco: Occasional UC  Prenatal labs: ABO, Rh: O/Positive/-- (01/14 0000) Antibody: Negative (01/14 0000) Rubella: Immune (01/14 0000) RPR: Nonreactive (02/24 0000)  HBsAg: Negative (01/14 0000)  HIV: Non-reactive (02/24 0000)  GBS: NEGATIVE (04/29 1550)   Assessment/Plan: Samantha Khan is a 22 y.o. G1P0 at [redacted]w[redacted]d who presents for IOL due to post-dates. - GBS negative - Cytotec for cervical ripening. Will start Pitocin later as well - Pain control: IV medications if needed.  Considering epidural - Anticipate NSVD  Everlene Other 11/30/2012, 9:00 PM  I have seen and examined this patient and I agree with the above. [name], Shyloh Krinke 3:06 AM 12/01/2012

## 2012-12-01 ENCOUNTER — Encounter (HOSPITAL_COMMUNITY): Payer: Self-pay

## 2012-12-01 ENCOUNTER — Encounter (HOSPITAL_COMMUNITY): Payer: Self-pay | Admitting: Anesthesiology

## 2012-12-01 ENCOUNTER — Inpatient Hospital Stay (HOSPITAL_COMMUNITY): Payer: Medicaid Other | Admitting: Anesthesiology

## 2012-12-01 LAB — ABO/RH: ABO/RH(D): O POS

## 2012-12-01 LAB — RPR: RPR Ser Ql: NONREACTIVE

## 2012-12-01 MED ORDER — PHENYLEPHRINE 40 MCG/ML (10ML) SYRINGE FOR IV PUSH (FOR BLOOD PRESSURE SUPPORT)
80.0000 ug | PREFILLED_SYRINGE | INTRAVENOUS | Status: DC | PRN
  Filled 2012-12-01: qty 2
  Filled 2012-12-01: qty 5

## 2012-12-01 MED ORDER — LIDOCAINE HCL (PF) 1 % IJ SOLN
INTRAMUSCULAR | Status: DC | PRN
  Administered 2012-12-01: 2 mL
  Administered 2012-12-01 (×3): 4 mL

## 2012-12-01 MED ORDER — PHENYLEPHRINE 40 MCG/ML (10ML) SYRINGE FOR IV PUSH (FOR BLOOD PRESSURE SUPPORT)
80.0000 ug | PREFILLED_SYRINGE | INTRAVENOUS | Status: DC | PRN
  Filled 2012-12-01: qty 2

## 2012-12-01 MED ORDER — LACTATED RINGERS IV SOLN
500.0000 mL | Freq: Once | INTRAVENOUS | Status: AC
  Administered 2012-12-01: 500 mL via INTRAVENOUS

## 2012-12-01 MED ORDER — DIPHENHYDRAMINE HCL 50 MG/ML IJ SOLN: 12.5000 mg | INTRAMUSCULAR | Status: DC | PRN

## 2012-12-01 MED ORDER — BUTORPHANOL TARTRATE 1 MG/ML IJ SOLN
1.0000 mg | Freq: Once | INTRAMUSCULAR | Status: DC
  Filled 2012-12-01: qty 1

## 2012-12-01 MED ORDER — EPHEDRINE 5 MG/ML INJ
10.0000 mg | INTRAVENOUS | Status: DC | PRN
  Filled 2012-12-01: qty 4
  Filled 2012-12-01: qty 2

## 2012-12-01 MED ORDER — BUTORPHANOL TARTRATE 1 MG/ML IJ SOLN
2.0000 mg | Freq: Once | INTRAMUSCULAR | Status: AC
  Administered 2012-12-01: 2 mg via INTRAVENOUS
  Filled 2012-12-01: qty 2

## 2012-12-01 MED ORDER — OXYTOCIN 40 UNITS IN LACTATED RINGERS INFUSION - SIMPLE MED
1.0000 m[IU]/min | INTRAVENOUS | Status: DC
  Administered 2012-12-01: 2 m[IU]/min via INTRAVENOUS

## 2012-12-01 MED ORDER — MISOPROSTOL 25 MCG QUARTER TABLET
25.0000 ug | ORAL_TABLET | ORAL | Status: AC
  Administered 2012-12-01 (×2): 25 ug via VAGINAL
  Filled 2012-12-01 (×2): qty 0.25

## 2012-12-01 MED ORDER — FENTANYL 2.5 MCG/ML BUPIVACAINE 1/10 % EPIDURAL INFUSION (WH - ANES)
14.0000 mL/h | INTRAMUSCULAR | Status: DC | PRN
  Administered 2012-12-01 (×2): 14 mL/h via EPIDURAL
  Filled 2012-12-01 (×2): qty 125

## 2012-12-01 MED ORDER — EPHEDRINE 5 MG/ML INJ
10.0000 mg | INTRAVENOUS | Status: DC | PRN
  Filled 2012-12-01: qty 2

## 2012-12-01 MED ORDER — ZOLPIDEM TARTRATE 5 MG PO TABS: 5.0000 mg | ORAL_TABLET | Freq: Every evening | ORAL | Status: DC | PRN

## 2012-12-01 NOTE — Progress Notes (Signed)
   Subjective: Reports increase in pain; Stadol helped better with relief.   Objective: BP 100/60  Pulse 93  Temp(Src) 98.2 F (36.8 C) (Oral)  Resp 16  Ht 5' (1.524 m)  Wt 77.111 kg (170 lb)  BMI 33.2 kg/m2      FHT:  FHR: 130's bpm, variability: moderate,  accelerations:  Present,  decelerations:  Present variables with good return to baseline. UC:   regular, every 4-7 minutes SVE:   Dilation: 7 Effacement (%): 90 Station: -1 Exam by:: Roney Marion, CNM  Labs: Lab Results  Component Value Date   WBC 9.8 11/30/2012   HGB 10.9* 11/30/2012   HCT 32.8* 11/30/2012   MCV 84.1 11/30/2012   PLT 230 11/30/2012    Assessment / Plan: Augmentation of labor, progressing well  Labor: Progressing normally Preeclampsia:  n/a Fetal Wellbeing:  Category II Pain Control:  Stadol I/D:  GBS neg Anticipated MOD:  NSVD AROM > clear fluid [facility] 12/01/2012, 1:57 PM

## 2012-12-01 NOTE — Progress Notes (Signed)
I have seen and examined this patient and I agree with the above. [name], [name] 3:05 AM 12/01/2012

## 2012-12-01 NOTE — Progress Notes (Signed)
Samantha Khan is a 22 y.o. G1P0 at [redacted]w[redacted]d by   Subjective:  Comfortable with epidural. Starting to feel some occasional rectal pressure.  Objective: BP 130/77  Pulse 106  Temp(Src) 98 F (36.7 C) (Oral)  Resp 18  Ht 5' (1.524 m)  Wt 77.111 kg (170 lb)  BMI 33.2 kg/m2  SpO2 97%   Total I/O In: -  Out: 400 [Urine:400]  FHT:  FHR: 135 bpm, variability: moderate,  accelerations:  Present,  decelerations:  Absent UC:   regular, every 2-3 minutes SVE:   Dilation: Lip/rim Effacement (%): 100 Station: 0 Exam by:: h. Khameron Gruenwald cnm  Labs: Lab Results  Component Value Date   WBC 9.8 11/30/2012   HGB 10.9* 11/30/2012   HCT 32.8* 11/30/2012   MCV 84.1 11/30/2012   PLT 230 11/30/2012    Assessment / Plan: Induction of labor due to postterm,  progressing well on pitocin  Labor: Progressing normally Preeclampsia:  NA Fetal Wellbeing:  Category I Pain Control:  Epidural I/D:  n/a Anticipated MOD:  NSVD  Tawnya Crook 12/01/2012, 9:23 PM

## 2012-12-01 NOTE — Progress Notes (Signed)
  Subjective: Pt reports increased comfort after epidural, does feel pressure.   Objective: BP 107/74  Pulse 93  Temp(Src) 98.6 F (37 C) (Oral)  Resp 18  Ht 5' (1.524 m)  Wt 77.111 kg (170 lb)  BMI 33.2 kg/m2  SpO2 97%      FHT:  FHR: 120's bpm, variability: moderate,  accelerations:  Present,  decelerations:  Present early, variable. UC:   regular, every 2-3 minutes SVE:   Dilation: 8.5 Effacement (%): 100 Station: 0 Exam by:: Roney Marion, CNM  Labs: Lab Results  Component Value Date   WBC 9.8 11/30/2012   HGB 10.9* 11/30/2012   HCT 32.8* 11/30/2012   MCV 84.1 11/30/2012   PLT 230 11/30/2012    Assessment / Plan: Augmentation of labor, progressing well  Labor: Progressing normally Preeclampsia:  n/a Fetal Wellbeing:  Category II Pain Control:  Epidural I/D:  GBS neg Anticipated MOD:  NSVD  [facility] 12/01/2012, 6:13 PM

## 2012-12-01 NOTE — Progress Notes (Signed)
Samantha Khan is a 22 y.o. G1P0 at [redacted]w[redacted]d by ultrasound admitted for induction of labor due to Post dates.  Subjective: Doing well. Slightly uncomfortable with CTX  Objective: BP 110/64  Pulse 77  Temp(Src) 98.1 F (36.7 C) (Oral)  Resp 18  Ht 5' (1.524 m)  Wt 77.111 kg (170 lb)  BMI 33.2 kg/m2      FHT:  FHR: 130 bpm, variability: moderate,  accelerations:  Present,  decelerations:  Absent UC:   regular, ~ every 5 minutes SVE:   Dilation: 2 Effacement (%): 50 Station: -2 Exam by:: dr. Adriana Simas  Labs: Lab Results  Component Value Date   WBC 9.8 11/30/2012   HGB 10.9* 11/30/2012   HCT 32.8* 11/30/2012   MCV 84.1 11/30/2012   PLT 230 11/30/2012    Assessment / Plan: Induction of labor due to postdates S/P Cytotec x 1.  Will give additional dose of Cytotec.  Labor: Progressing normally Fetal Wellbeing:  Category I Pain Control:  Nubain, Patient has option of Epidural but does not want it currently Anticipated MOD:  NSVD  Everlene Other 12/01/2012, 1:57 AM

## 2012-12-01 NOTE — Anesthesia Procedure Notes (Signed)
Epidural Patient location during procedure: OB Start time: 12/01/2012 4:32 PM  Staffing Performed by: anesthesiologist   Preanesthetic Checklist Completed: patient identified, site marked, surgical consent, pre-op evaluation, timeout performed, IV checked, risks and benefits discussed and monitors and equipment checked  Epidural Patient position: sitting Prep: site prepped and draped and DuraPrep Patient monitoring: continuous pulse ox and blood pressure Approach: midline Injection technique: LOR air  Needle:  Needle type: Tuohy  Needle gauge: 17 G Needle length: 9 cm and 9 Needle insertion depth: 5.5 cm Catheter type: closed end flexible Catheter size: 19 Gauge Catheter at skin depth: 10.5 cm Test dose: negative  Assessment Events: blood not aspirated, injection not painful, no injection resistance, negative IV test and no paresthesia  Additional Notes Discussed risk of headache, infection, bleeding, nerve injury and failed or incomplete block.  Patient voices understanding and wishes to proceed.  Epidural placed easily on first attempt.  No paresthesia. Patient tolerated procedure well with no apparent complications.  Jasmine December, MDReason for block:procedure for pain

## 2012-12-01 NOTE — Anesthesia Preprocedure Evaluation (Signed)

## 2012-12-01 NOTE — Progress Notes (Signed)
   Subjective: Pt reports increased pain with contractions.  Declines epidural at this time. Objective: BP 108/52  Pulse 95  Temp(Src) 98.2 F (36.8 C) (Oral)  Resp 18  Ht 5' (1.524 m)  Wt 77.111 kg (170 lb)  BMI 33.2 kg/m2      FHT:  FHR: 130's bpm, variability: moderate,  accelerations:  Present,  decelerations:  Absent UC:   regular, every 2-5 minutes SVE:   Dilation: 6.5 Effacement (%): 80 Station: -2 Exam by:: Roney Marion, CNM  Labs: Lab Results  Component Value Date   WBC 9.8 11/30/2012   HGB 10.9* 11/30/2012   HCT 32.8* 11/30/2012   MCV 84.1 11/30/2012   PLT 230 11/30/2012    Assessment / Plan: Augmentation of labor, progressing well  Labor: Progressing normally Fetal Wellbeing:  Category I Pain Control:  Nubain > try Stadol 2 mg I/D:  GBS neg Anticipated MOD:  NSVD  Since pt is making progress; recheck in two hours and if continuing to change, expectant management; if minimal change begin pitocin augmentation.  [facility] 12/01/2012, 11:43 AM

## 2012-12-02 ENCOUNTER — Encounter (HOSPITAL_COMMUNITY): Payer: Self-pay

## 2012-12-02 MED ORDER — SENNOSIDES-DOCUSATE SODIUM 8.6-50 MG PO TABS
2.0000 | ORAL_TABLET | Freq: Every day | ORAL | Status: DC
  Administered 2012-12-02: 2 via ORAL

## 2012-12-02 MED ORDER — WITCH HAZEL-GLYCERIN EX PADS: 1.0000 "application " | MEDICATED_PAD | CUTANEOUS | Status: DC | PRN

## 2012-12-02 MED ORDER — MEASLES, MUMPS & RUBELLA VAC ~~LOC~~ INJ
0.5000 mL | INJECTION | Freq: Once | SUBCUTANEOUS | Status: DC
  Filled 2012-12-02: qty 0.5

## 2012-12-02 MED ORDER — ONDANSETRON HCL 4 MG/2ML IJ SOLN: 4.0000 mg | INTRAMUSCULAR | Status: DC | PRN

## 2012-12-02 MED ORDER — DIBUCAINE 1 % RE OINT: 1.0000 "application " | TOPICAL_OINTMENT | RECTAL | Status: DC | PRN

## 2012-12-02 MED ORDER — TETANUS-DIPHTH-ACELL PERTUSSIS 5-2.5-18.5 LF-MCG/0.5 IM SUSP
0.5000 mL | Freq: Once | INTRAMUSCULAR | Status: AC
  Administered 2012-12-02: 0.5 mL via INTRAMUSCULAR
  Filled 2012-12-02: qty 0.5

## 2012-12-02 MED ORDER — LANOLIN HYDROUS EX OINT: TOPICAL_OINTMENT | CUTANEOUS | Status: DC | PRN

## 2012-12-02 MED ORDER — SIMETHICONE 80 MG PO CHEW: 80.0000 mg | CHEWABLE_TABLET | ORAL | Status: DC | PRN

## 2012-12-02 MED ORDER — BENZOCAINE-MENTHOL 20-0.5 % EX AERO
1.0000 "application " | INHALATION_SPRAY | CUTANEOUS | Status: DC | PRN
  Filled 2012-12-02 (×2): qty 56

## 2012-12-02 MED ORDER — ONDANSETRON HCL 4 MG PO TABS: 4.0000 mg | ORAL_TABLET | ORAL | Status: DC | PRN

## 2012-12-02 MED ORDER — OXYCODONE-ACETAMINOPHEN 5-325 MG PO TABS
1.0000 | ORAL_TABLET | ORAL | Status: DC | PRN
  Administered 2012-12-02: 1 via ORAL
  Filled 2012-12-02: qty 1

## 2012-12-02 MED ORDER — ZOLPIDEM TARTRATE 5 MG PO TABS: 5.0000 mg | ORAL_TABLET | Freq: Every evening | ORAL | Status: DC | PRN

## 2012-12-02 MED ORDER — DIPHENHYDRAMINE HCL 25 MG PO CAPS: 25.0000 mg | ORAL_CAPSULE | Freq: Four times a day (QID) | ORAL | Status: DC | PRN

## 2012-12-02 MED ORDER — PRENATAL MULTIVITAMIN CH
1.0000 | ORAL_TABLET | Freq: Every day | ORAL | Status: DC
  Administered 2012-12-02 – 2012-12-03 (×2): 1 via ORAL
  Filled 2012-12-02 (×2): qty 1

## 2012-12-02 MED ORDER — IBUPROFEN 600 MG PO TABS
600.0000 mg | ORAL_TABLET | Freq: Four times a day (QID) | ORAL | Status: DC
  Administered 2012-12-02 – 2012-12-03 (×6): 600 mg via ORAL
  Filled 2012-12-02 (×6): qty 1

## 2012-12-02 NOTE — Progress Notes (Signed)
UR completed 

## 2012-12-02 NOTE — Anesthesia Postprocedure Evaluation (Signed)
  Anesthesia Post-op Note  Patient: Samantha Khan  Procedure(s) Performed: * No procedures listed *  Patient Location: PACU and Mother/Baby  Anesthesia Type:Epidural  Level of Consciousness: awake, alert  and oriented  Airway and Oxygen Therapy: Patient Spontanous Breathing  Post-op Pain: none  Post-op Assessment: Post-op Vital signs reviewed, Patient's Cardiovascular Status Stable, No headache, No backache, No residual numbness and No residual motor weakness  Post-op Vital Signs: Reviewed and stable  Complications: No apparent anesthesia complications

## 2012-12-03 MED ORDER — PRENATAL MULTIVITAMIN CH: 1.0000 | ORAL_TABLET | Freq: Every day | ORAL | Status: DC

## 2012-12-03 MED ORDER — IBUPROFEN 600 MG PO TABS: 600.0000 mg | ORAL_TABLET | Freq: Four times a day (QID) | ORAL | Status: DC

## 2012-12-03 NOTE — Progress Notes (Signed)
CSW referral received to assess reason for [facility] at 31 weeks however as per chart review, pt started [facility] at 21 weeks.  CSW intervention was not provided.

## 2012-12-03 NOTE — Discharge Summary (Signed)
Obstetric Discharge Summary Reason for Admission: induction of labor due to post dates Prenatal Procedures: ultrasound Intrapartum Procedures: spontaneous vaginal delivery Postpartum Procedures: none Complications-Operative and Postpartum: 2nd degree perineal laceration Hemoglobin  Date Value Range Status  11/30/2012 10.9* 12.0 - 15.0 g/dL Final     HCT  Date Value Range Status  11/30/2012 32.8* 36.0 - 46.0 % Final    Physical Exam:  General: alert, cooperative and no distress Lochia: appropriate Uterine Fundus: firm DVT Evaluation: No evidence of DVT seen on physical exam. No cords or calf tenderness. No significant calf/ankle edema.  Discharge Diagnoses: Term Pregnancy-delivered  Hospital Course: Samantha Khan is a 22 y.o. G1P1001 at [redacted]w[redacted]d who was admitted to the hospital for IOL due to post dates.  Patient had normal vaginal delivery.  Pt is breast feeding and plans to use Nexplanon for contraception. She will follow up with [facility] in 6 weeks.   Discharge Information: Date: 12/03/2012 Activity: pelvic rest Diet: routine Medications: PNV and Ibuprofen Condition: stable Instructions: refer to practice specific booklet Discharge to: home  Newborn Data: Live born female  Birth Weight: 8 lb 14 oz (4026 g) APGAR: 8, 9  Home with mother.  Samantha Khan Other 12/03/2012, 7:41 AM  I have seen and examined this patient and I agree with the above. Cam Hai 7:57 AM 12/03/2012

## 2012-12-13 ENCOUNTER — Telehealth: Payer: Self-pay | Admitting: *Deleted

## 2012-12-13 NOTE — Telephone Encounter (Signed)
Per Clydie Braun at [facility], patient blood pressure today 90/60, vaginal delivery on 12/02/2012, patient c/o headaches, no improvement with ibuprofen by several days. Called transferred to front staff to make first available appointment with provider.

## 2012-12-14 ENCOUNTER — Ambulatory Visit: Payer: Self-pay | Admitting: Advanced Practice Midwife

## 2013-01-06 ENCOUNTER — Ambulatory Visit (INDEPENDENT_AMBULATORY_CARE_PROVIDER_SITE_OTHER): Payer: Self-pay | Admitting: Obstetrics & Gynecology

## 2013-01-06 ENCOUNTER — Encounter: Payer: Self-pay | Admitting: Obstetrics & Gynecology

## 2013-01-06 MED ORDER — NORGESTIM-ETH ESTRAD TRIPHASIC 0.18/0.215/0.25 MG-25 MCG PO TABS: 1.0000 | ORAL_TABLET | Freq: Every day | ORAL | Status: DC

## 2013-01-06 NOTE — Patient Instructions (Signed)
Uso de los anticonceptivos orales  (Oral Contraception Use) Los anticonceptivos orales son medicamentos que se utilizan para evitar el embarazo. Su funcin es evitar que los ovarios liberen vulos. Las hormonas de los anticonceptivos orales hacen que el moco cervical se haga ms espeso, lo que evita que el esperma ingrese al tero. Tambin hacen que la membrana que tapiza el tero se vuelva ms fina, lo que no permite que el huevo fertilizado se adhiera a la pared del tero. Los anticonceptivos orales son muy efectivos cuando se toman exactamente como se prescriben. Sin embargo, los anticonceptivos orales no previenen contra las enfermedades de transmisin sexual (ETS). La prctica del sexo seguro, como el uso de preservativos, junto con los anticonceptivos orales, ayudan a prevenir ese tipo de enfermedades. Antes de tomar la pldora, usted debe hacerse un examen fsico y un [place]. El mdico podr indicarle anlisis de sangre, si es necesario. El mdico se asegurar de que usted es una buena candidata para usar anticonceptivos orales. Converse con su mdico acerca de los posibles efectos secundarios de los anticonceptivos orales. Cuando se inicia el uso de anticonceptivos orales, se pueden tomar durante 2 a 3 meses para que el cuerpo se adapte a los cambios en los niveles hormonales en el cuerpo.  CMO TOMAR LOS ANTICONCEPTIVOS ORALES  El mdico le indicar como comenzar a tomar el primer ciclo de anticonceptivos orales. De lo contrario usted puede:   Comenzar el 1er. da del ciclo menstrual. No necesitar proteccin anticonceptiva adicional al comenzar en este momento.  Comenzar el primer domingo luego de su perodo menstrual, o el da en que adquiere el medicamento. En estos casos deber tener proteccin anticonceptiva adicional durante los primeros 7 das del ciclo. Luego de comenzar a tomar los anticonceptivos orales:   Si olvid de tomar 1 pldora, tmela tan pronto como lo recuerde. Tome la  siguiente pldora a la hora habitual.  Si olvida tomar 2  ms pldoras, utilice un mtodo anticonceptivo adicional hasta que comience su prximo perodo menstrual.  Si utiliza el envase de 28 pldoras y olvida tomar 1 de las ltimas 7 (pldoras sin hormonas), sto no tiene importancia. Simplemente deseche el resto de las pldoras que no contienen hormonas y comience un nuevo envase. No importa cuando comience a tomar los anticonceptivos, siempre empiece un nuevo envase el mismo da de la semana. Tenga un envase extra de pldoras anticonceptivas y use un mtodo anticonceptivo adicional para el caso en que se olvide de tomar algunas pldoras o pierda la caja.  INSTRUCCIONES PARA EL CUIDADO DOMICILIARIO  No fume.  Siempre use un condn para protegerse de las enfermedades de transmisin sexual. Los anticonceptivos orales no protegen contra las enfermedades de transmisin sexual.  Marque en un calendario las fechas en las que tiene sus perodos menstruales.  Lea la informacin y consejos que vienen con las pldoras. Pngase en contacto con el mdico siempre que tenga preguntas. SOLICITE ATENCIN MDICA SI:  Presenta nuseas o vmitos.  Tiene flujo o sangrado vaginal anormal.  Aparece una erupcin cutnea.  No tiene el perodo menstrual.  Pierde el cabello.  Necesita tratamiento por cambios en su estado de nimo o por depresin.  Se siente mareada al tomar la pldora.  Comienza a aparecer acn con el uso de los anticonceptivos orales.  Queda embarazada. SOLICITE ATENCIN MDICA DE INMEDIATO SI:  Siente dolor en el pecho.  Le falta el aire.  Le duele mucho la cabeza y no puede controlar el dolor.  Siente adormecimiento o tiene   dificultad para hablar.  Tiene problemas de visin.  Presenta dolor, inflamacin o hinchazn en las piernas. Document Released: 06/12/2011 Document Revised: 09/15/2011 Exit[employer] Patient Information 2014 [employer].  

## 2013-01-06 NOTE — Progress Notes (Signed)
Patient ID: Samantha Khan, female   DOB: 10-Dec-1990, 22 y.o.   MRN: [id number] Delivery Note 5.29.2014  At 1:07 AM a viable and healthy female was delivered via Vaginal, Spontaneous Delivery (Presentation: ; Occiput Anterior). APGAR: 8, 9; weight 8 lb 14 oz (4026 g).  Placenta status: Intact, Spontaneous. Cord: 3 vessels with the following complications: None.  Anesthesia: Epidural  Episiotomy: None  Lacerations: 2nd degree;Perineal  Suture Repair: 3.0 vicryl  Est. Blood Loss (mL):  Mom to postpartum. Baby to nursery-stable.   No pp complaints No sex yet No period  Wants to do pills  Depression score normal  Begin tri sprintec  6 months yearly exam

## 2013-02-04 DIAGNOSIS — K602 Anal fissure, unspecified: Secondary | ICD-10-CM

## 2013-02-04 HISTORY — DX: Anal fissure, unspecified: K60.2

## 2014-05-08 ENCOUNTER — Encounter: Payer: Self-pay | Admitting: Obstetrics & Gynecology

## 2014-11-26 ENCOUNTER — Encounter (HOSPITAL_COMMUNITY): Payer: Self-pay

## 2014-11-26 ENCOUNTER — Emergency Department (HOSPITAL_COMMUNITY)
Admission: EM | Admit: 2014-11-26 | Discharge: 2014-11-26 | Disposition: A | Discharge status: Home / Self Care | Payer: No Typology Code available for payment source | Attending: Emergency Medicine | Admitting: Emergency Medicine

## 2014-11-26 DIAGNOSIS — Z3202 Encounter for pregnancy test, result negative: Secondary | ICD-10-CM | POA: Diagnosis not present

## 2014-11-26 DIAGNOSIS — Y998 Other external cause status: Secondary | ICD-10-CM | POA: Insufficient documentation

## 2014-11-26 DIAGNOSIS — Z793 Long term (current) use of hormonal contraceptives: Secondary | ICD-10-CM | POA: Diagnosis not present

## 2014-11-26 DIAGNOSIS — Y92410 Unspecified street and highway as the place of occurrence of the external cause: Secondary | ICD-10-CM | POA: Insufficient documentation

## 2014-11-26 DIAGNOSIS — Z79899 Other long term (current) drug therapy: Secondary | ICD-10-CM | POA: Insufficient documentation

## 2014-11-26 DIAGNOSIS — S3991XA Unspecified injury of abdomen, initial encounter: Secondary | ICD-10-CM | POA: Insufficient documentation

## 2014-11-26 DIAGNOSIS — Y9389 Activity, other specified: Secondary | ICD-10-CM | POA: Insufficient documentation

## 2014-11-26 DIAGNOSIS — R1031 Right lower quadrant pain: Secondary | ICD-10-CM

## 2014-11-26 LAB — PREGNANCY, URINE: Preg Test, Ur: NEGATIVE
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
## 2014-11-26 NOTE — Discharge Instructions (Signed)
Please call your doctor for a followup appointment within 24-48 hours. When you talk to your doctor please let them know that you were seen in the emergency department and have them acquire all of your records so that they can discuss the findings with you and formulate a treatment plan to fully care for your new and ongoing problems. ° °[facility] Doctor List ° ° ° °Edward Hawkins MD. Specialty: Pulmonary Disease Contact information: [address]  °[address]  °[place] [address]  °[phone]  ° °Margaret Simpson, MD. Specialty: Family Medicine Contact information: [address]  °[place] [address]  °[phone]  ° °Scott Luking, MD. Specialty: Family Medicine Contact information: [address]  °[address]  ° [place] [address]  °[phone]  ° °Tesfaye Fanta, MD Specialty: Internal Medicine Contact information: [address]  °[place] [address]  °[phone]  ° °Zach Hall, MD. Specialty: Internal Medicine Contact information: [address]  °[place] [address]  °[phone]  ° °Angus Mcinnis, MD. Specialty: Family Medicine Contact information: [address]  °[place] [address]  °[phone]  ° °Stephen Knowlton, MD. Specialty: Family Medicine Contact information: [address]  °[address]  °[place] [address]  °[phone]  ° °Roy Fagan, MD. Specialty: Internal Medicine Contact information: [address]  °[address]  °[place] [address]  °[phone]  ° ° °
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
## 2015-02-24 ENCOUNTER — Emergency Department (HOSPITAL_COMMUNITY)
Admission: EM | Admit: 2015-02-24 | Discharge: 2015-02-24 | Disposition: A | Discharge status: Home / Self Care | Payer: No Typology Code available for payment source | Attending: Emergency Medicine | Admitting: Emergency Medicine

## 2015-02-24 ENCOUNTER — Encounter (HOSPITAL_COMMUNITY): Payer: Self-pay | Admitting: Cardiology

## 2015-02-24 DIAGNOSIS — Y9389 Activity, other specified: Secondary | ICD-10-CM | POA: Diagnosis not present

## 2015-02-24 DIAGNOSIS — Y92410 Unspecified street and highway as the place of occurrence of the external cause: Secondary | ICD-10-CM | POA: Diagnosis not present

## 2015-02-24 DIAGNOSIS — S161XXA Strain of muscle, fascia and tendon at neck level, initial encounter: Secondary | ICD-10-CM | POA: Insufficient documentation

## 2015-02-24 DIAGNOSIS — Y998 Other external cause status: Secondary | ICD-10-CM | POA: Diagnosis not present

## 2015-02-24 DIAGNOSIS — S199XXA Unspecified injury of neck, initial encounter: Secondary | ICD-10-CM | POA: Diagnosis present

## 2015-02-24 MED ORDER — IBUPROFEN 800 MG PO TABS: 800.0000 mg | ORAL_TABLET | Freq: Three times a day (TID) | ORAL | Status: DC

## 2015-02-24 MED ORDER — CYCLOBENZAPRINE HCL 10 MG PO TABS: 10.0000 mg | ORAL_TABLET | Freq: Two times a day (BID) | ORAL | Status: DC | PRN
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
## 2015-08-20 ENCOUNTER — Ambulatory Visit: Payer: Self-pay | Admitting: Physician Assistant

## 2015-08-20 ENCOUNTER — Encounter: Payer: Self-pay | Admitting: Physician Assistant

## 2015-08-20 VITALS — BP 90/58 | HR 80 | Temp 97.7°F | Ht 59.5 in | Wt 169.3 lb

## 2015-08-20 DIAGNOSIS — K649 Unspecified hemorrhoids: Secondary | ICD-10-CM

## 2015-08-20 MED ORDER — HYDROCORTISONE 2.5 % RE CREA: 1.0000 "application " | TOPICAL_CREAM | Freq: Two times a day (BID) | RECTAL | Status: DC | PRN

## 2015-08-20 NOTE — Patient Instructions (Addendum)
Hemorroides  (Hemorrhoids) Las hemorroides son venas inflamadas alrededor del recto o del ano. Hay dos tipos de hemorroides:   Hemorroides internas. Se forman en las venas del interior del recto. Pueden abultarse hacia el exterior e irritarse y Cabin crew.  Hemorroides externas. Se producen en las venas externas al ano y pueden sentirse como un bulto o zona hinchada dura y dolorosa cerca del ano. CAUSAS   Embarazo.   Obesidad.   Constipacin o diarrea.   Dificultad para mover el intestino.   Permanecer sentado durante largos perodos en el inodoro.  Levantar objetos pesados u otras actividades que impliquen esfuerzo.  Sexo anal. SNTOMAS   Dolor.   Picazn o irritacin anal.   Sangrado rectal.   Prdida fecal.   Inflamacin anal.   Uno o ms bultos en la zona anal.    INSTRUCCIONES PARA EL CUIDADO EN EL HOGAR   Consuma alimentos con fibra, como cereales integrales, legumbres, frutos secos, frutas y verduras. Pregntele a su mdico acerca de tomar productos con fibra aadida en ellos (suplementos defibra).  Aumente la ingestin de lquidos. Beba gran cantidad de lquido para mantener la orina de tono claro o color amarillo plido.   Haga ejercicios regularmente.   Vaya al bao cuando sienta la necesidad de mover el intestino. No espere.   Evite hacer fuerza al mover el intestino.   Mantenga la zona anal limpia y seca. Use papel higinico hmedo o toallitas humedecidas despus de mover el intestino.   Puede usar o Contractor segn las indicaciones algunas cremas especiales y supositorios.   Tome slo medicamentos de venta libre o recetados, segn las indicaciones del mdico.   Tome baos de asiento tibios durante 15-20 minutos, 3-4 veces por da para Primary school teacher y las [place].   Coloque una bolsa de hielo sobre las hemorroides si le duelen o se hinchan. Usar compresas de [employer] baos de asiento puede ser [place].   Ponga el hielo en  una bolsa plstica.   Colquese una toalla entre la piel y la bolsa de hielo.   Deje el hielo durante 15 - 20 minutos y aplquelo 3 - 4 veces por Futures trader.   No utilice una almohada en forma de aro ni se siente en el inodoro durante perodos prolongados. Esto aumenta la afluencia de sangre y Chief Technology Officer.  SOLICITE ATENCIN MDICA SI:   Aumenta el dolor y la hinchazn y no puede controlarlo con la medicacin o con Pharmacist, community.  Tiene un sangrado que no Magazine features editor.  No puede mover el intestino.  Siente dolor o tiene inflamacin fuera de la zona de las hemorroides. ASEGRESE DE QUE:   Comprende estas instrucciones.  Controlar su enfermedad.  Recibir ayuda de inmediato si no mejora o si empeora.   Esta informacin no tiene Theme park manager el consejo del mdico. Asegrese de hacerle al mdico cualquier pregunta que tenga.   Document Released: 06/23/2005 Document Revised: 02/23/2013 Elsevier Interactive Patient Education [employer]

## 2015-08-20 NOTE — Progress Notes (Signed)
   BP 90/58 mmHg  Pulse 80  Temp(Src) 97.7 F (36.5 C)  Ht 4' 11.5" (1.511 m)  Wt 169 lb 4.8 oz (76.794 kg)  BMI 33.64 kg/m2  SpO2 98%   Subjective:    Patient ID: Samantha Khan, female    DOB: 01/21/1991, 24 y.o.   MRN: [id number]  HPI: Samantha Khan is a 25 y.o. female presenting on 08/20/2015 for Hemorrhoids   HPI  Chief Complaint  Patient presents with  . Hemorrhoids    has had for about a year. has burning and itching. pt has used vaginal cream. pt states it helped with the itching.     Relevant past medical, surgical, family and social history reviewed and updated as indicated. Interim medical history since our last visit reviewed. Allergies and medications reviewed and updated.  CURRENT MEDS: OCP  Review of Systems  Constitutional: Negative for fever, chills, diaphoresis, appetite change, fatigue and unexpected weight change.  HENT: Negative for congestion, dental problem, drooling, ear pain, facial swelling, hearing loss, mouth sores, sneezing, sore throat, trouble swallowing and voice change.   Eyes: Negative for pain, discharge, redness, itching and visual disturbance.  Respiratory: Negative for cough, choking, shortness of breath and wheezing.   Cardiovascular: Negative for chest pain, palpitations and leg swelling.  Gastrointestinal: Negative for vomiting, abdominal pain, diarrhea, constipation and blood in stool.  Endocrine: Negative for cold intolerance, heat intolerance and polydipsia.  Genitourinary: Negative for dysuria, hematuria and decreased urine volume.  Musculoskeletal: Negative for back pain, arthralgias and gait problem.  Skin: Negative for rash.  Allergic/Immunologic: Negative for environmental allergies.  Neurological: Negative for seizures, syncope, light-headedness and headaches.  Hematological: Negative for adenopathy.  Psychiatric/Behavioral: Negative for suicidal ideas, dysphoric mood and agitation. The patient is not nervous/anxious.      Per HPI unless specifically indicated above     Objective:    BP 90/58 mmHg  Pulse 80  Temp(Src) 97.7 F (36.5 C)  Ht 4' 11.5" (1.511 m)  Wt 169 lb 4.8 oz (76.794 kg)  BMI 33.64 kg/m2  SpO2 98%  Wt Readings from Last 3 Encounters:  08/20/15 169 lb 4.8 oz (76.794 kg)  02/24/15 165 lb (74.844 kg)  11/26/14 169 lb (76.658 kg)    Physical Exam  Constitutional: She is oriented to person, place, and time. She appears well-developed and well-nourished.  HENT:  Head: Normocephalic and atraumatic.  Neck: Neck supple.  Cardiovascular: Normal rate and regular rhythm.   Pulmonary/Chest: Effort normal and breath sounds normal.  Abdominal: Soft. Bowel sounds are normal. She exhibits no mass. There is no hepatosplenomegaly. There is no tenderness.  Genitourinary: Rectal exam shows external hemorrhoid (very very small, nonthrombosed). Rectal exam shows no fissure, no tenderness and anal tone normal.  Musculoskeletal: She exhibits no edema.  Lymphadenopathy:    She has no cervical adenopathy.  Neurological: She is alert and oriented to person, place, and time.  Skin: Skin is warm and dry.  Psychiatric: She has a normal mood and affect. Her behavior is normal.  Vitals reviewed. Manuella Ghazi assisted)    Assessment & Plan:   Encounter Diagnosis  Name Primary?  . Hemorrhoids, unspecified hemorrhoid type Yes   -rx proctocream HC  and coupon given. Counseled and gave reading info on hemorrhoids -F/u June as scheduled. RTO sooner prn

## 2015-12-20 ENCOUNTER — Ambulatory Visit: Payer: Self-pay | Admitting: Physician Assistant

## 2015-12-24 ENCOUNTER — Ambulatory Visit: Payer: Self-pay | Admitting: Physician Assistant

## 2016-01-10 ENCOUNTER — Encounter: Payer: Self-pay | Admitting: Physician Assistant

## 2016-04-07 ENCOUNTER — Ambulatory Visit: Payer: Self-pay

## 2016-12-09 ENCOUNTER — Ambulatory Visit: Payer: Self-pay | Admitting: Physician Assistant

## 2017-07-05 ENCOUNTER — Other Ambulatory Visit: Payer: Self-pay

## 2017-07-05 ENCOUNTER — Inpatient Hospital Stay (HOSPITAL_COMMUNITY): Payer: Self-pay

## 2017-07-05 ENCOUNTER — Inpatient Hospital Stay (HOSPITAL_COMMUNITY)
Admission: AD | Admit: 2017-07-05 | Discharge: 2017-07-05 | Disposition: A | Discharge status: Home / Self Care | Payer: Self-pay | Source: Ambulatory Visit | Attending: Obstetrics & Gynecology | Admitting: Obstetrics & Gynecology

## 2017-07-05 ENCOUNTER — Encounter (HOSPITAL_COMMUNITY): Payer: Self-pay

## 2017-07-05 DIAGNOSIS — Z8249 Family history of ischemic heart disease and other diseases of the circulatory system: Secondary | ICD-10-CM | POA: Insufficient documentation

## 2017-07-05 DIAGNOSIS — Z3201 Encounter for pregnancy test, result positive: Secondary | ICD-10-CM | POA: Insufficient documentation

## 2017-07-05 DIAGNOSIS — O26899 Other specified pregnancy related conditions, unspecified trimester: Secondary | ICD-10-CM

## 2017-07-05 DIAGNOSIS — Z6790 Unspecified blood type, Rh positive: Secondary | ICD-10-CM

## 2017-07-05 DIAGNOSIS — Z3A11 11 weeks gestation of pregnancy: Secondary | ICD-10-CM | POA: Insufficient documentation

## 2017-07-05 DIAGNOSIS — R109 Unspecified abdominal pain: Secondary | ICD-10-CM | POA: Insufficient documentation

## 2017-07-05 DIAGNOSIS — O26891 Other specified pregnancy related conditions, first trimester: Secondary | ICD-10-CM | POA: Insufficient documentation

## 2017-07-05 DIAGNOSIS — O209 Hemorrhage in early pregnancy, unspecified: Secondary | ICD-10-CM | POA: Insufficient documentation

## 2017-07-05 DIAGNOSIS — Z833 Family history of diabetes mellitus: Secondary | ICD-10-CM | POA: Insufficient documentation

## 2017-07-05 DIAGNOSIS — O4690 Antepartum hemorrhage, unspecified, unspecified trimester: Secondary | ICD-10-CM

## 2017-07-05 DIAGNOSIS — O4691 Antepartum hemorrhage, unspecified, first trimester: Secondary | ICD-10-CM

## 2017-07-05 LAB — URINALYSIS, ROUTINE W REFLEX MICROSCOPIC
Bacteria, UA: NONE SEEN
Bilirubin Urine: NEGATIVE
GLUCOSE, UA: NEGATIVE mg/dL
Hgb urine dipstick: NEGATIVE
Ketones, ur: NEGATIVE mg/dL
Nitrite: NEGATIVE
Protein, ur: NEGATIVE mg/dL
Specific Gravity, Urine: 1.005 (ref 1.005–1.030)
pH: 7 (ref 5.0–8.0)

## 2017-07-05 LAB — CBC
HCT: 40 % (ref 36.0–46.0)
HEMOGLOBIN: 13.7 g/dL (ref 12.0–15.0)
MCH: 32 pg (ref 26.0–34.0)
MCHC: 34.3 g/dL (ref 30.0–36.0)
MCV: 93.5 fL (ref 78.0–100.0)
Platelets: 238 10*3/uL (ref 150–400)
RBC: 4.28 MIL/uL (ref 3.87–5.11)
RDW: 13.9 % (ref 11.5–15.5)
WBC: 7.8 10*3/uL (ref 4.0–10.5)

## 2017-07-05 LAB — WET PREP, GENITAL
Sperm: NONE SEEN
TRICH WET PREP: NONE SEEN
YEAST WET PREP: NONE SEEN

## 2017-07-05 LAB — HCG, QUANTITATIVE, PREGNANCY: hCG, Beta Chain, Quant, S: 73249 m[IU]/mL — ABNORMAL HIGH (ref <5)

## 2017-07-05 LAB — POCT PREGNANCY, URINE: Preg Test, Ur: POSITIVE — AB

## 2017-07-05 NOTE — MAU Note (Addendum)
Pain in lower back and lower abdominal area since Tuesday. Started having vaginal bleeding for 2 days has lightened up. Pain is 7/10 is constant.   Patient has history of irregular periods does not know LMP

## 2017-07-05 NOTE — MAU Provider Note (Signed)
History     CSN: [id number]  Arrival date and time: 07/05/17 1309   First Provider Initiated Contact with Patient 07/05/17 1339      Chief Complaint  Patient presents with  . Abdominal Pain   G2P1001 @unknown  gestation here with VB and cramping. VB started 5 days ago and lasted 1 1/2 days. Lower abdominal cramping started at the same time. No recent IC. Denies urinary sx. No new partner. Denies hx of STDs. She thinks her LMP was in April, only has 2-3 periods per year. Had a positive HPT over 1 month ago.   OB History    Gravida Para Term Preterm AB Living   2 1 1     1    SAB TAB Ectopic Multiple Live Births           1      Past Medical History:  Diagnosis Date  . Medical history non-contributory   . Pregnancy     Past Surgical History:  Procedure Laterality Date  . NO PAST SURGERIES      Family History  Problem Relation Age of Onset  . Hypertension Maternal Grandmother   . Diabetes Maternal Grandmother   . Diabetes Paternal Grandmother   . Diabetes Paternal Grandfather     Social History   Tobacco Use  . Smoking status: Never Smoker  . Smokeless tobacco: Never Used  Substance Use Topics  . Alcohol use: No  . Drug use: No    Allergies: No Known Allergies  Medications Prior to Admission  Medication Sig Dispense Refill Last Dose  . Prenatal Vit-Fe Fumarate-FA (PRENATAL MULTIVITAMIN) TABS tablet Take 1 tablet by mouth daily at 12 noon.   07/04/2017 at Unknown time    Review of Systems  Constitutional: Negative for fever.  Gastrointestinal: Positive for abdominal pain. Negative for nausea and vomiting.  Genitourinary: Positive for vaginal bleeding. Negative for dysuria, frequency, hematuria and urgency.   Physical Exam   Blood pressure 103/64, pulse 69, temperature 98.3 F (36.8 C), temperature source Oral, resp. rate 16.  Physical Exam  Constitutional: She is oriented to person, place, and time. She appears well-developed and well-nourished. No  distress.  HENT:  Head: Normocephalic and atraumatic.  Neck: Normal range of motion.  Cardiovascular: Normal rate.  Respiratory: Effort normal.  GI: Soft. She exhibits no distension and no mass. There is no tenderness. There is no rebound and no guarding.  Genitourinary:  Genitourinary Comments: External: no lesions or erythema Vagina: rugated, pink, moist, mucopurulent discharge from os, slight bleeding after q-tip contact Uterus: + enlarged, anteverted, non tender, no CMT Adnexae: no masses, + tenderness left, + tenderness right    Musculoskeletal: Normal range of motion.  Neurological: She is alert and oriented to person, place, and time.  Skin: Skin is warm and dry.  Psychiatric: She has a normal mood and affect.   Results for orders placed or performed during the hospital encounter of 07/05/17 (from the past 24 hour(s))  Urinalysis, Routine w reflex microscopic     Status: Abnormal   Collection Time: 07/05/17  1:13 PM  Result Value Ref Range   Color, Urine STRAW (A) YELLOW   APPearance CLEAR CLEAR   Specific Gravity, Urine 1.005 1.005 - 1.030   pH 7.0 5.0 - 8.0   Glucose, UA NEGATIVE NEGATIVE mg/dL   Hgb urine dipstick NEGATIVE NEGATIVE   Bilirubin Urine NEGATIVE NEGATIVE   Ketones, ur NEGATIVE NEGATIVE mg/dL   Protein, ur NEGATIVE NEGATIVE mg/dL   Nitrite NEGATIVE  NEGATIVE   Leukocytes, UA MODERATE (A) NEGATIVE   RBC / HPF 0-5 0 - 5 RBC/hpf   WBC, UA 0-5 0 - 5 WBC/hpf   Bacteria, UA NONE SEEN NONE SEEN   Squamous Epithelial / LPF 0-5 (A) NONE SEEN   Mucus PRESENT   Pregnancy, urine POC     Status: Abnormal   Collection Time: 07/05/17  1:36 PM  Result Value Ref Range   Preg Test, Ur POSITIVE (A) NEGATIVE  Wet prep, genital     Status: Abnormal   Collection Time: 07/05/17  1:40 PM  Result Value Ref Range   Yeast Wet Prep HPF POC NONE SEEN NONE SEEN   Trich, Wet Prep NONE SEEN NONE SEEN   Clue Cells Wet Prep HPF POC PRESENT (A) NONE SEEN   WBC, Wet Prep HPF POC  MANY (A) NONE SEEN   Sperm NONE SEEN   CBC     Status: None   Collection Time: 07/05/17  1:48 PM  Result Value Ref Range   WBC 7.8 4.0 - 10.5 K/uL   RBC 4.28 3.87 - 5.11 MIL/uL   Hemoglobin 13.7 12.0 - 15.0 g/dL   HCT 16.140.0 09.636.0 - 04.546.0 %   MCV 93.5 78.0 - 100.0 fL   MCH 32.0 26.0 - 34.0 pg   MCHC 34.3 30.0 - 36.0 g/dL   RDW 40.913.9 81.111.5 - 91.415.5 %   Platelets 238 150 - 400 K/uL  hCG, quantitative, pregnancy     Status: Abnormal   Collection Time: 07/05/17  1:48 PM  Result Value Ref Range   hCG, Beta Chain, Quant, S 73,249 (H) <5 mIU/mL   [place] Ob Comp Less 14 Wks  Result Date: 07/05/2017 CLINICAL DATA:  Cramping and vaginal bleeding for the past 2 days. EXAM: OBSTETRIC <14 WK ULTRASOUND TECHNIQUE: Transabdominal ultrasound was performed for evaluation of the gestation as well as the maternal uterus and adnexal regions. COMPARISON:  Pelvic ultrasound dated July 05, 2012. FINDINGS: Intrauterine gestational sac: Single Yolk sac:  Not Visualized. Embryo:  Visualized. Cardiac Activity: Visualized. Heart Rate: 164 bpm CRL:   4.3  cm   11 w 1 d                  US EDC: 01/23/2018 Subchorionic hemorrhage:  None visualized. Maternal uterus/adnexae: Unremarkable. IMPRESSION: 1. Single live intrauterine pregnancy with an estimated gestational age of [redacted] weeks, 1 day. No acute abnormality visualized. Electronically Signed   By: Obie DredgeWilliam T Derry M.D.   On: 07/05/2017 16:01   MAU Course  Procedures  MDM Labs and US ordered and reviewed. Normal IUP on US, no SCH. Unclear cause for VB, ?cervical. Cultures pending. Stable for discharge.  Assessment and Plan   1. [redacted] weeks gestation of pregnancy   2. Abdominal pain in pregnancy   3. Vaginal bleeding during pregnancy   4. Blood type, Rh positive    [facility] scheduled SAB/bleeding precautions  Allergies as of 07/05/2017   No Known Allergies     Medication List    TAKE these medications   prenatal multivitamin  Tabs tablet Take 1 tablet by mouth daily at 12 noon.      Donette LarryMelanie Corran Lalone, CNM 07/05/2017, 4:18 PM

## 2017-07-05 NOTE — Discharge Instructions (Signed)
Vaginal Bleeding During Pregnancy, First Trimester °A small amount of bleeding (spotting) from the vagina is common in early pregnancy. Sometimes the bleeding is normal and is not a problem, and sometimes it is a sign of something serious. Be sure to tell your doctor about any bleeding from your vagina right away. °Follow these instructions at home: °· Watch your condition for any changes. °· Follow your doctor's instructions about how active you can be. °· If you are on bed rest: °? You may need to stay in bed and only get up to use the bathroom. °? You may be allowed to do some activities. °? If you need help, make plans for someone to help you. °· Write down: °? The number of pads you use each day. °? How often you change pads. °? How soaked (saturated) your pads are. °· Do not use tampons. °· Do not douche. °· Do not have sex or orgasms until your doctor says it is okay. °· If you pass any tissue from your vagina, save the tissue so you can show it to your doctor. °· Only take medicines as told by your doctor. °· Do not take aspirin because it can make you bleed. °· Keep all follow-up visits as told by your doctor. °Contact a doctor if: °· You bleed from your vagina. °· You have cramps. °· You have labor pains. °· You have a fever that does not go away after you take medicine. °Get help right away if: °· You have very bad cramps in your back or belly (abdomen). °· You pass large clots or tissue from your vagina. °· You bleed more. °· You feel light-headed or weak. °· You pass out (faint). °· You have chills. °· You are leaking fluid or have a gush of fluid from your vagina. °· You pass out while pooping (having a bowel movement). °This information is not intended to replace advice given to you by your health care provider. Make sure you discuss any questions you have with your health care provider. °Document Released: 11/07/2013 Document Revised: 11/29/2015 Document Reviewed: 02/28/2013 °Elsevier Interactive  Patient Education © 2018 Elsevier Inc. ° °

## 2017-07-06 LAB — GC/CHLAMYDIA PROBE AMP (~~LOC~~) NOT AT ARMC
Chlamydia: NEGATIVE
NEISSERIA GONORRHEA: NEGATIVE

## 2017-07-07 NOTE — L&D Delivery Note (Addendum)
Patient is 27 y.o. G2P1001 5823w5d admitted for SOL. Marland Kitchen.AROM at 1912.  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  Delivery Note At 8:24 PM a viable female was delivered via cephalic; OA presentation.  APGAR: pending ; weight  pending.   Placenta status: intact. 3 vessel  Cord.  Head delivered OA. No nuchal cord present. Shoulder and body delivered in usual fashion. Infant with spontaneous cry, placed on mother's abdomen, dried and bulb suctioned. Cord clamped x 2 after 1-minute delay, and cut by family member. Cord blood drawn. Placenta delivered spontaneously with gentle cord traction. Fundus firm with massage and Pitocin. Perineum inspected and found to have no lacerations,   Anesthesia:  epidural Episiotomy:  no Lacerations:  none Suture Repair: n/a Est. Blood Loss (mL):  100ml  Mom to postpartum.  Baby to Couplet care / Skin to Skin.  Samantha Khan 01/21/2018, 8:47 PM   The above was performed by Dr. Corky Downslsen under my direct supervision and guidance.

## 2017-07-08 ENCOUNTER — Ambulatory Visit: Payer: Self-pay | Admitting: *Deleted

## 2017-07-21 ENCOUNTER — Ambulatory Visit: Payer: Medicaid Other | Admitting: *Deleted

## 2017-07-21 ENCOUNTER — Ambulatory Visit (INDEPENDENT_AMBULATORY_CARE_PROVIDER_SITE_OTHER): Payer: Medicaid Other

## 2017-07-21 ENCOUNTER — Encounter: Payer: Self-pay | Admitting: Advanced Practice Midwife

## 2017-07-21 ENCOUNTER — Other Ambulatory Visit: Payer: Medicaid Other

## 2017-07-21 ENCOUNTER — Ambulatory Visit (INDEPENDENT_AMBULATORY_CARE_PROVIDER_SITE_OTHER): Payer: Medicaid Other | Admitting: Advanced Practice Midwife

## 2017-07-21 ENCOUNTER — Other Ambulatory Visit: Payer: Self-pay

## 2017-07-21 VITALS — BP 94/56 | HR 85 | Wt 152.0 lb

## 2017-07-21 DIAGNOSIS — Z3682 Encounter for antenatal screening for nuchal translucency: Secondary | ICD-10-CM

## 2017-07-21 DIAGNOSIS — Z3A13 13 weeks gestation of pregnancy: Secondary | ICD-10-CM

## 2017-07-21 DIAGNOSIS — Z3401 Encounter for supervision of normal first pregnancy, first trimester: Secondary | ICD-10-CM

## 2017-07-21 DIAGNOSIS — Z3490 Encounter for supervision of normal pregnancy, unspecified, unspecified trimester: Secondary | ICD-10-CM

## 2017-07-21 DIAGNOSIS — Z331 Pregnant state, incidental: Secondary | ICD-10-CM

## 2017-07-21 DIAGNOSIS — Z3482 Encounter for supervision of other normal pregnancy, second trimester: Secondary | ICD-10-CM

## 2017-07-21 DIAGNOSIS — Z3481 Encounter for supervision of other normal pregnancy, first trimester: Secondary | ICD-10-CM

## 2017-07-21 DIAGNOSIS — Z1389 Encounter for screening for other disorder: Secondary | ICD-10-CM

## 2017-07-21 HISTORY — DX: Encounter for supervision of normal pregnancy, unspecified, unspecified trimester: Z34.90

## 2017-07-21 LAB — POCT URINALYSIS DIPSTICK
Blood, UA: NEGATIVE
Glucose, UA: NEGATIVE
KETONES UA: NEGATIVE
LEUKOCYTES UA: NEGATIVE
NITRITE UA: NEGATIVE
PROTEIN UA: NEGATIVE

## 2017-07-21 MED ORDER — PRENATAL VITAMINS 0.8 MG PO TABS: 1.0000 | ORAL_TABLET | Freq: Every day | ORAL | 12 refills | Status: DC

## 2017-07-21 NOTE — Progress Notes (Signed)
US 13+3 wks,measurement c/w dates,? Bicornuate vs subseptate uterus(not seen on previous outside ultrasound),NB present,NT 1.8 mm,normal ovaries bilat,fhr 153 bpm,crl 71.93 mm

## 2017-07-21 NOTE — Progress Notes (Signed)
  Subjective:    Samantha Khan is a G2P1001 4641w3d being seen today for her first obstetrical visit.  Her obstetrical history is significant for term pregnancy wo problems.  Pregnancy history fully reviewed.  Patient reports no complaints.  Vitals:   07/21/17 0932  BP: (!) 94/56  Pulse: 85  Weight: 152 lb (68.9 kg)    HISTORY: OB History  Gravida Para Term Preterm AB Living  2 1 1     1   SAB TAB Ectopic Multiple Live Births          1    # Outcome Date GA Lbr Len/2nd Weight Sex Delivery Anes PTL Lv  2 Current           1 Term 12/02/12 2241w3d 19:45 / 01:22 8 lb 14 oz (4.026 kg) M Vag-Spont EPI N LIV     Past Medical History:  Diagnosis Date  . Medical history non-contributory   . Pregnancy    Past Surgical History:  Procedure Laterality Date  . NO PAST SURGERIES     Family History  Problem Relation Age of Onset  . Hypertension Maternal Grandmother   . Diabetes Maternal Grandmother   . Diabetes Paternal Grandmother   . Diabetes Paternal Grandfather   . Diabetes Maternal Grandfather      Exam                                      System:     Skin: normal coloration and turgor, no rashes    Neurologic: oriented, normal, normal mood   Extremities: normal strength, tone, and muscle mass   HEENT PERRLA   Mouth/Teeth mucous membranes moist, normal dentition   Neck supple and no masses   Cardiovascular: regular rate and rhythm   Respiratory:  appears well, vitals normal, no respiratory distress, acyanotic   Abdomen: soft, non-tender;  FHR: 150 US        The nature of [place] [facility] with multiple MDs and other Advanced Practice Providers was explained to patient; also emphasized that residents, students are part of our team.  Assessment:    Pregnancy: G2P1001 Patient Active Problem List   Diagnosis Date Noted  . Supervision of normal pregnancy 07/21/2017        Plan:     Initial labs drawn. Continue prenatal  vitamins  Problem list reviewed and updated  Reviewed n/v relief measures and warning s/s to report  Reviewed recommended weight gain based on pre-gravid BMI  Encouraged well-balanced diet Genetic Screening discussed Integrated Screen: requested.  Ultrasound discussed; fetal survey: requested.  Return for today for NT w/Samantha Khan and 4 weeks for LROB.  Khan,Samantha [place] 07/21/2017

## 2017-07-21 NOTE — Patient Instructions (Signed)
 First Trimester of Pregnancy The first trimester of pregnancy is from week 1 until the end of week 12 (months 1 through 3). A week after a sperm fertilizes an egg, the egg will implant on the wall of the uterus. This embryo will begin to develop into a baby. Genes from you and your partner are forming the baby. The female genes determine whether the baby is a boy or a girl. At 6-8 weeks, the eyes and face are formed, and the heartbeat can be seen on ultrasound. At the end of 12 weeks, all the baby's organs are formed.  Now that you are pregnant, you will want to do everything you can to have a healthy baby. Two of the most important things are to get good prenatal care and to follow your health care provider's instructions. Prenatal care is all the medical care you receive before the baby's birth. This care will help prevent, find, and treat any problems during the pregnancy and childbirth. BODY CHANGES Your body goes through many changes during pregnancy. The changes vary from woman to woman.   You may gain or lose a couple of pounds at first.  You may feel sick to your stomach (nauseous) and throw up (vomit). If the vomiting is uncontrollable, call your health care provider.  You may tire easily.  You may develop headaches that can be relieved by medicines approved by your health care provider.  You may urinate more often. Painful urination may mean you have a bladder infection.  You may develop heartburn as a result of your pregnancy.  You may develop constipation because certain hormones are causing the muscles that push waste through your intestines to slow down.  You may develop hemorrhoids or swollen, bulging veins (varicose veins).  Your breasts may begin to grow larger and become tender. Your nipples may stick out more, and the tissue that surrounds them (areola) may become darker.  Your gums may bleed and may be sensitive to brushing and flossing.  Dark spots or blotches  (chloasma, mask of pregnancy) may develop on your face. This will likely fade after the baby is born.  Your menstrual periods will stop.  You may have a loss of appetite.  You may develop cravings for certain kinds of food.  You may have changes in your emotions from day to day, such as being excited to be pregnant or being concerned that something may go wrong with the pregnancy and baby.  You may have more vivid and strange dreams.  You may have changes in your hair. These can include thickening of your hair, rapid growth, and changes in texture. Some women also have hair loss during or after pregnancy, or hair that feels dry or thin. Your hair will most likely return to normal after your baby is born. WHAT TO EXPECT AT YOUR PRENATAL VISITS During a routine prenatal visit:  You will be weighed to make sure you and the baby are growing normally.  Your blood pressure will be taken.  Your abdomen will be measured to track your baby's growth.  The fetal heartbeat will be listened to starting around week 10 or 12 of your pregnancy.  Test results from any previous visits will be discussed. Your health care provider may ask you:  How you are feeling.  If you are feeling the baby move.  If you have had any abnormal symptoms, such as leaking fluid, bleeding, severe headaches, or abdominal cramping.  If you have any questions. Other   tests that may be performed during your first trimester include:  Blood tests to find your blood type and to check for the presence of any previous infections. They will also be used to check for low iron levels (anemia) and Rh antibodies. Later in the pregnancy, blood tests for diabetes will be done along with other tests if problems develop.  Urine tests to check for infections, diabetes, or protein in the urine.  An ultrasound to confirm the proper growth and development of the baby.  An amniocentesis to check for possible genetic problems.  Fetal  screens for spina bifida and Down syndrome.  You may need other tests to make sure you and the baby are doing well. HOME CARE INSTRUCTIONS  Medicines  Follow your health care provider's instructions regarding medicine use. Specific medicines may be either safe or unsafe to take during pregnancy.  Take your prenatal vitamins as directed.  If you develop constipation, try taking a stool softener if your health care provider approves. Diet  Eat regular, well-balanced meals. Choose a variety of foods, such as meat or vegetable-based protein, fish, milk and low-fat dairy products, vegetables, fruits, and whole grain breads and cereals. Your health care provider will help you determine the amount of weight gain that is right for you.  Avoid raw meat and uncooked cheese. These carry germs that can cause birth defects in the baby.  Eating four or five small meals rather than three large meals a day may help relieve nausea and vomiting. If you start to feel nauseous, eating a few soda crackers can be helpful. Drinking liquids between meals instead of during meals also seems to help nausea and vomiting.  If you develop constipation, eat more high-fiber foods, such as fresh vegetables or fruit and whole grains. Drink enough fluids to keep your urine clear or pale yellow. Activity and Exercise  Exercise only as directed by your health care provider. Exercising will help you:  Control your weight.  Stay in shape.  Be prepared for labor and delivery.  Experiencing pain or cramping in the lower abdomen or low back is a good sign that you should stop exercising. Check with your health care provider before continuing normal exercises.  Try to avoid standing for long periods of time. Move your legs often if you must stand in one place for a long time.  Avoid heavy lifting.  Wear low-heeled shoes, and practice good posture.  You may continue to have sex unless your health care provider directs you  otherwise. Relief of Pain or Discomfort  Wear a good support bra for breast tenderness.   Take warm sitz baths to soothe any pain or discomfort caused by hemorrhoids. Use hemorrhoid cream if your health care provider approves.   Rest with your legs elevated if you have leg cramps or low back pain.  If you develop varicose veins in your legs, wear support hose. Elevate your feet for 15 minutes, 3-4 times a day. Limit salt in your diet. Prenatal Care  Schedule your prenatal visits by the twelfth week of pregnancy. They are usually scheduled monthly at first, then more often in the last 2 months before delivery.  Write down your questions. Take them to your prenatal visits.  Keep all your prenatal visits as directed by your health care provider. Safety  Wear your seat belt at all times when driving.  Make a list of emergency phone numbers, including numbers for family, friends, the hospital, and police and fire departments. General   Tips  Ask your health care provider for a referral to a local prenatal education class. Begin classes no later than at the beginning of month 6 of your pregnancy.  Ask for help if you have counseling or nutritional needs during pregnancy. Your health care provider can offer advice or refer you to specialists for help with various needs.  Do not use hot tubs, steam rooms, or saunas.  Do not douche or use tampons or scented sanitary pads.  Do not cross your legs for long periods of time.  Avoid cat litter boxes and soil used by cats. These carry germs that can cause birth defects in the baby and possibly loss of the fetus by miscarriage or stillbirth.  Avoid all smoking, herbs, alcohol, and medicines not prescribed by your health care provider. Chemicals in these affect the formation and growth of the baby.  Schedule a dentist appointment. At home, brush your teeth with a soft toothbrush and be gentle when you floss. SEEK MEDICAL CARE IF:   You have  dizziness.  You have mild pelvic cramps, pelvic pressure, or nagging pain in the abdominal area.  You have persistent nausea, vomiting, or diarrhea.  You have a bad smelling vaginal discharge.  You have pain with urination.  You notice increased swelling in your face, hands, legs, or ankles. SEEK IMMEDIATE MEDICAL CARE IF:   You have a fever.  You are leaking fluid from your vagina.  You have spotting or bleeding from your vagina.  You have severe abdominal cramping or pain.  You have rapid weight gain or loss.  You vomit blood or material that looks like coffee grounds.  You are exposed to German measles and have never had them.  You are exposed to fifth disease or chickenpox.  You develop a severe headache.  You have shortness of breath.  You have any kind of trauma, such as from a fall or a car accident. Document Released: 06/17/2001 Document Revised: 11/07/2013 Document Reviewed: 05/03/2013 [employer] Patient Information 2015 [employer]. This information is not intended to replace advice given to you by your health care provider. Make sure you discuss any questions you have with your health care provider.   Nausea & Vomiting  Have saltine crackers or pretzels by your bed and eat a few bites before you raise your head out of bed in the morning  Eat small frequent meals throughout the day instead of large meals  Drink plenty of fluids throughout the day to stay hydrated, just don't drink a lot of fluids with your meals.  This can make your stomach fill up faster making you feel sick  Do not brush your teeth right after you eat  Products with real ginger are good for nausea, like ginger ale and ginger hard candy Make sure it says made with real ginger!  Sucking on sour candy like lemon heads is also good for nausea  If your prenatal vitamins make you nauseated, take them at night so you will sleep through the nausea  Sea Bands  If you feel like you need  medicine for the nausea & vomiting please let us know  If you are unable to keep any fluids or food down please let us know   Constipation  Drink plenty of fluid, preferably water, throughout the day  Eat foods high in fiber such as fruits, vegetables, and grains  Exercise, such as walking, is a good way to keep your bowels regular  Drink warm fluids, especially warm   prune juice, or decaf coffee  Eat a 1/2 cup of real oatmeal (not instant), 1/2 cup applesauce, and 1/2-1 cup warm prune juice every day  If needed, you may take Colace (docusate sodium) stool softener once or twice a day to help keep the stool soft. If you are pregnant, wait until you are out of your first trimester (12-14 weeks of pregnancy)  If you still are having problems with constipation, you may take Miralax once daily as needed to help keep your bowels regular.  If you are pregnant, wait until you are out of your first trimester (12-14 weeks of pregnancy)  Safe Medications in Pregnancy   Acne: Benzoyl Peroxide Salicylic Acid  Backache/Headache: Tylenol: 2 regular strength every 4 hours OR              2 Extra strength every 6 hours  Colds/Coughs/Allergies: Benadryl (alcohol free) 25 mg every 6 hours as needed Breath right strips Claritin Cepacol throat lozenges Chloraseptic throat spray Cold-Eeze- up to three times per day Cough drops, alcohol free Flonase (by prescription only) Guaifenesin Mucinex Robitussin DM (plain only, alcohol free) Saline nasal spray/drops Sudafed (pseudoephedrine) & Actifed ** use only after [redacted] weeks gestation and if you do not have high blood pressure Tylenol Vicks Vaporub Zinc lozenges Zyrtec   Constipation: Colace Ducolax suppositories Fleet enema Glycerin suppositories Metamucil Milk of magnesia Miralax Senokot Smooth move tea  Diarrhea: Kaopectate Imodium A-D  *NO pepto Bismol  Hemorrhoids: Anusol Anusol HC Preparation  H Tucks  Indigestion: Tums Maalox Mylanta Zantac  Pepcid  Insomnia: Benadryl (alcohol free) 25mg every 6 hours as needed Tylenol PM Unisom, no Gelcaps  Leg Cramps: Tums MagGel  Nausea/Vomiting:  Bonine Dramamine Emetrol Ginger extract Sea bands Meclizine  Nausea medication to take during pregnancy:  Unisom (doxylamine succinate 25 mg tablets) Take one tablet daily at bedtime. If symptoms are not adequately controlled, the dose can be increased to a maximum recommended dose of two tablets daily (1/2 tablet in the morning, 1/2 tablet mid-afternoon and one at bedtime). Vitamin B6 100mg tablets. Take one tablet twice a day (up to 200 mg per day).  Skin Rashes: Aveeno products Benadryl cream or 25mg every 6 hours as needed Calamine Lotion 1% cortisone cream  Yeast infection: Gyne-lotrimin 7 Monistat 7   **If taking multiple medications, please check labels to avoid duplicating the same active ingredients **take medication as directed on the label ** Do not exceed 4000 mg of tylenol in 24 hours **Do not take medications that contain aspirin or ibuprofen      

## 2017-07-22 LAB — MICROSCOPIC EXAMINATION: CASTS: NONE SEEN /LPF

## 2017-07-22 LAB — URINALYSIS, ROUTINE W REFLEX MICROSCOPIC
BILIRUBIN UA: NEGATIVE
GLUCOSE, UA: NEGATIVE
KETONES UA: NEGATIVE
Leukocytes, UA: NEGATIVE
NITRITE UA: POSITIVE — AB
Protein, UA: NEGATIVE
RBC UA: NEGATIVE
SPEC GRAV UA: 1.018 (ref 1.005–1.030)
Urobilinogen, Ur: 0.2 mg/dL (ref 0.2–1.0)
pH, UA: 6.5 (ref 5.0–7.5)

## 2017-07-22 LAB — CBC
Hematocrit: 40.7 % (ref 34.0–46.6)
Hemoglobin: 13.6 g/dL (ref 11.1–15.9)
MCH: 31.9 pg (ref 26.6–33.0)
MCHC: 33.4 g/dL (ref 31.5–35.7)
MCV: 96 fL (ref 79–97)
Platelets: 283 10*3/uL (ref 150–379)
RBC: 4.26 x10E6/uL (ref 3.77–5.28)
RDW: 14.4 % (ref 12.3–15.4)
WBC: 9 10*3/uL (ref 3.4–10.8)

## 2017-07-22 LAB — MED LIST OPTION NOT SELECTED

## 2017-07-22 LAB — RUBELLA SCREEN: Rubella Antibodies, IGG: 16.6 index (ref >0.99)

## 2017-07-22 LAB — RPR: RPR Ser Ql: NONREACTIVE

## 2017-07-22 LAB — GC/CHLAMYDIA PROBE AMP
Chlamydia trachomatis, NAA: NEGATIVE
Neisseria gonorrhoeae by PCR: NEGATIVE

## 2017-07-22 LAB — ANTIBODY SCREEN: Antibody Screen: NEGATIVE

## 2017-07-22 LAB — HEPATITIS B SURFACE ANTIGEN: HEP B S AG: NEGATIVE

## 2017-07-22 LAB — HIV ANTIBODY (ROUTINE TESTING W REFLEX): HIV SCREEN 4TH GENERATION: NONREACTIVE

## 2017-07-23 LAB — PMP SCREEN PROFILE (10S), URINE
AMPHETAMINE SCREEN URINE: NEGATIVE ng/mL
BARBITURATE SCREEN URINE: NEGATIVE ng/mL
BENZODIAZEPINE SCREEN, URINE: NEGATIVE ng/mL
CANNABINOIDS UR QL SCN: NEGATIVE ng/mL
COCAINE(METAB.)SCREEN, URINE: NEGATIVE ng/mL
Creatinine(Crt), U: 81.9 mg/dL (ref 20.0–300.0)
Methadone Screen, Urine: NEGATIVE ng/mL
OXYCODONE+OXYMORPHONE UR QL SCN: NEGATIVE ng/mL
Opiate Scrn, Ur: NEGATIVE ng/mL
PH UR, DRUG SCRN: 6.3 (ref 4.5–8.9)
PHENCYCLIDINE QUANTITATIVE URINE: NEGATIVE ng/mL
Propoxyphene Scrn, Ur: NEGATIVE ng/mL

## 2017-07-23 LAB — URINE CULTURE: Organism ID, Bacteria: NO GROWTH

## 2017-08-03 ENCOUNTER — Encounter: Payer: Self-pay | Admitting: *Deleted

## 2017-08-18 ENCOUNTER — Encounter: Payer: Self-pay | Admitting: Advanced Practice Midwife

## 2017-08-19 ENCOUNTER — Ambulatory Visit (INDEPENDENT_AMBULATORY_CARE_PROVIDER_SITE_OTHER): Payer: Self-pay | Admitting: Advanced Practice Midwife

## 2017-08-19 ENCOUNTER — Encounter: Payer: Self-pay | Admitting: Advanced Practice Midwife

## 2017-08-19 VITALS — BP 90/60 | HR 98 | Wt 158.0 lb

## 2017-08-19 DIAGNOSIS — Q513 Bicornate uterus: Secondary | ICD-10-CM

## 2017-08-19 DIAGNOSIS — Z3A17 17 weeks gestation of pregnancy: Secondary | ICD-10-CM

## 2017-08-19 DIAGNOSIS — Z1379 Encounter for other screening for genetic and chromosomal anomalies: Secondary | ICD-10-CM

## 2017-08-19 DIAGNOSIS — Z1389 Encounter for screening for other disorder: Secondary | ICD-10-CM

## 2017-08-19 DIAGNOSIS — Z3481 Encounter for supervision of other normal pregnancy, first trimester: Secondary | ICD-10-CM

## 2017-08-19 DIAGNOSIS — Z331 Pregnant state, incidental: Secondary | ICD-10-CM

## 2017-08-19 DIAGNOSIS — Z3401 Encounter for supervision of normal first pregnancy, first trimester: Secondary | ICD-10-CM

## 2017-08-19 DIAGNOSIS — Z363 Encounter for antenatal screening for malformations: Secondary | ICD-10-CM

## 2017-08-19 LAB — POCT URINALYSIS DIPSTICK
GLUCOSE UA: NEGATIVE
Ketones, UA: NEGATIVE
Leukocytes, UA: NEGATIVE
Nitrite, UA: NEGATIVE
Protein, UA: NEGATIVE
RBC UA: NEGATIVE

## 2017-08-19 NOTE — Progress Notes (Signed)
G2P1001 9477w4d Estimated Date of Delivery: 01/23/18  Blood pressure 90/60, pulse 98, weight 158 lb (71.7 kg).   BP weight and urine results all reviewed and noted.  Please refer to the obstetrical flow sheet for the fundal height and fetal heart rate documentation:  Patient reports some fetal movement, denies any bleeding and no rupture of membranes symptoms or regular contractions. Patient is without complaints. All questions were answered.  Orders Placed This Encounter  Procedures  . US OB Comp + 14 Wk  . INTEGRATED 2  . POCT urinalysis dipstick    Plan:  Continued routine obstetrical care,   Return for 1 week for anatomy scan only; 4 weeks LROB.

## 2017-08-24 LAB — INTEGRATED 2
AFP MARKER: 36.2 ng/mL
AFP MoM: 1.04
Crown Rump Length: 71.9 mm
DIA MOM: 0.75
DIA Value: 124.7 pg/mL
Estriol, Unconjugated: 1.38 ng/mL
GESTATIONAL AGE: 17.1 wk
Gest. Age on Collection Date: 13 weeks
HCG MOM: 1.04
Maternal Age at EDD: 28.4 yr
NUCHAL TRANSLUCENCY MOM: 1.02
Nuchal Translucency (NT): 1.8 mm
Number of Fetuses: 1
PAPP-A MOM: 0.83
PAPP-A VALUE: 974.2 ng/mL
TEST RESULTS: NEGATIVE
Weight: 152 [lb_av]
Weight: 152 [lb_av]
hCG Value: 28 IU/mL
uE3 MoM: 1.37

## 2017-08-24 LAB — INTEGRATED 1
Crown Rump Length: 71.9 mm
GEST. AGE ON COLLECTION DATE: 13 wk
MATERNAL AGE AT EDD: 28.4 a
NUCHAL TRANSLUCENCY (NT): 1.8 mm
NUMBER OF FETUSES: 1
PAPP-A VALUE: 974.2 ng/mL
WEIGHT: 152 [lb_av]

## 2017-08-26 ENCOUNTER — Ambulatory Visit (INDEPENDENT_AMBULATORY_CARE_PROVIDER_SITE_OTHER): Payer: Self-pay

## 2017-08-26 DIAGNOSIS — Z3402 Encounter for supervision of normal first pregnancy, second trimester: Secondary | ICD-10-CM

## 2017-08-26 DIAGNOSIS — Q513 Bicornate uterus: Secondary | ICD-10-CM

## 2017-08-26 DIAGNOSIS — Z363 Encounter for antenatal screening for malformations: Secondary | ICD-10-CM

## 2017-08-26 NOTE — Progress Notes (Signed)
US 18+4 wks,cephalic,cx 3.1 cm, post pl gr 0,normal ovaries bilat,SVP of fluid 5.1 cm,fhr 137 bpm,efw 237 g,anatomy complete,no obvious abnormalities

## 2017-09-16 ENCOUNTER — Encounter: Payer: Self-pay | Admitting: Obstetrics and Gynecology

## 2017-09-16 ENCOUNTER — Ambulatory Visit (INDEPENDENT_AMBULATORY_CARE_PROVIDER_SITE_OTHER): Payer: Self-pay | Admitting: Obstetrics and Gynecology

## 2017-09-16 ENCOUNTER — Other Ambulatory Visit: Payer: Self-pay

## 2017-09-16 VITALS — BP 104/56 | HR 85 | Wt 163.0 lb

## 2017-09-16 DIAGNOSIS — Q513 Bicornate uterus: Secondary | ICD-10-CM

## 2017-09-16 DIAGNOSIS — O34592 Maternal care for other abnormalities of gravid uterus, second trimester: Secondary | ICD-10-CM

## 2017-09-16 DIAGNOSIS — Z3402 Encounter for supervision of normal first pregnancy, second trimester: Secondary | ICD-10-CM

## 2017-09-16 DIAGNOSIS — Z1389 Encounter for screening for other disorder: Secondary | ICD-10-CM

## 2017-09-16 DIAGNOSIS — Z3A21 21 weeks gestation of pregnancy: Secondary | ICD-10-CM

## 2017-09-16 DIAGNOSIS — Z331 Pregnant state, incidental: Secondary | ICD-10-CM

## 2017-09-16 LAB — POCT URINALYSIS DIPSTICK
Blood, UA: NEGATIVE
Glucose, UA: NEGATIVE
Ketones, UA: NEGATIVE
LEUKOCYTES UA: NEGATIVE
Nitrite, UA: NEGATIVE
PROTEIN UA: NEGATIVE

## 2017-09-16 NOTE — Progress Notes (Signed)
   LOW-RISK PREGNANCY VISIT Patient name: Samantha PiggKarla Urbanski MRN [id number]  Date of birth: 11/24/1990 Chief Complaint:   Routine Prenatal Visit  History of Present Illness:   Samantha PiggKarla Bibbee is a 27 y.o. 432P1001 female at 1019w4d with an Estimated Date of Delivery: 01/23/18 being seen today for ongoing management of a low-risk pregnancy.  Today she reports no complaints. She has not thought about BC after her pregnancy. She would like to consider the Nexplanon  . Vag. Bleeding: None.  Movement: Present. denies leaking of fluid. Review of Systems:   Pertinent items are noted in HPI Denies abnormal vaginal discharge w/ itching/odor/irritation, headaches, visual changes, shortness of breath, chest pain, abdominal pain, severe nausea/vomiting, or problems with urination or bowel movements unless otherwise stated above. Pertinent History Reviewed:  Reviewed past medical,surgical, social, obstetrical and family history.  Reviewed problem list, medications and allergies. Physical Assessment:   Vitals:   09/16/17 0919  BP: (!) 104/56  Pulse: 85  Weight: 163 lb (73.9 kg)  Body mass index is 32.37 kg/m.        Physical Examination:   General appearance: Well appearing, and in no distress  Mental status: Alert, oriented to person, place, and time  Skin: Warm & dry  Cardiovascular: Normal heart rate noted  Respiratory: Normal respiratory effort, no distress  Abdomen: Soft, gravid, nontender  Pelvic: Cervical exam deferred         Extremities: Edema: None  Fetal Status: Fetal Heart Rate (bpm): 140 Fundal Height: 23 cm Movement: Present   U+3  Results for orders placed or performed in visit on 09/16/17 (from the past 24 hour(s))  POCT urinalysis dipstick   Collection Time: 09/16/17  9:20 AM  Result Value Ref Range   Color, UA     Clarity, UA     Glucose, UA neg    Bilirubin, UA     Ketones, UA neg    Spec Grav, UA  1.010 - 1.025   Blood, UA neg    pH, UA  5.0 - 8.0   Protein, UA neg    Urobilinogen, UA  0.2 or 1.0 E.U./dL   Nitrite, UA neg    Leukocytes, UA Negative Negative   Appearance     Odor      Assessment & Plan:  1) Low-risk pregnancy G2P1001 at 819w4d with an Estimated Date of Delivery: 01/23/18   2. 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     Meds: No orders of the defined types were placed in this encounter.  Labs/procedures today: none  Plan:  Continue routine obstetrical care   Reviewed: Preterm labor symptoms and general obstetric precautions including but not limited to vaginal bleeding, contractions, leaking of fluid and fetal movement were reviewed in detail with the patient.  All questions were answered  Follow-up: Return in about 4 weeks (around 10/14/2017), or if symptoms worsen or fail to improve, for LROB, PN2.  Orders Placed This Encounter  Procedures  . POCT urinalysis dipstick     By signing my name below, I, Izna Ahmed, attest that this documentation has been prepared under the direction and in the presence of Tilda BurrowFerguson, Jery Hollern V, MD. Electronically Signed: Redge GainerIzna Ahmed, Medical Scribe. 09/16/17. 10:16 AM.  I personally performed the services described in this documentation, which was SCRIBED in my presence. The recorded information has been reviewed and considered accurate. It has been edited as necessary during review. Tilda BurrowJohn V Cartier Washko, MD

## 2017-10-14 ENCOUNTER — Other Ambulatory Visit: Payer: Self-pay

## 2017-10-14 ENCOUNTER — Ambulatory Visit (INDEPENDENT_AMBULATORY_CARE_PROVIDER_SITE_OTHER): Payer: Self-pay | Admitting: Advanced Practice Midwife

## 2017-10-14 VITALS — BP 98/54 | HR 80 | Wt 168.0 lb

## 2017-10-14 DIAGNOSIS — Z3A25 25 weeks gestation of pregnancy: Secondary | ICD-10-CM

## 2017-10-14 DIAGNOSIS — Z131 Encounter for screening for diabetes mellitus: Secondary | ICD-10-CM

## 2017-10-14 DIAGNOSIS — Z331 Pregnant state, incidental: Secondary | ICD-10-CM

## 2017-10-14 DIAGNOSIS — Z1389 Encounter for screening for other disorder: Secondary | ICD-10-CM

## 2017-10-14 DIAGNOSIS — Z3482 Encounter for supervision of other normal pregnancy, second trimester: Secondary | ICD-10-CM

## 2017-10-14 LAB — POCT URINALYSIS DIPSTICK
Blood, UA: NEGATIVE
Glucose, UA: NEGATIVE
Ketones, UA: NEGATIVE
Leukocytes, UA: NEGATIVE
NITRITE UA: NEGATIVE
PROTEIN UA: NEGATIVE

## 2017-10-14 NOTE — Progress Notes (Signed)
  G2P1001 4739w4d Estimated Date of Delivery: 01/23/18  Blood pressure (!) 98/54, pulse 80, weight 168 lb (76.2 kg).   BP weight and urine results all reviewed and noted.  Please refer to the obstetrical flow sheet for the fundal height and fetal heart rate documentation:  Patient reports good fetal movement, denies any bleeding and no rupture of membranes symptoms or regular contractions. Patient waking up after a few hours, stays awake for 2-3 hours.  All questions were answered.   Physical Assessment:   Vitals:   10/14/17 0936  BP: (!) 98/54  Pulse: 80  Weight: 168 lb (76.2 kg)  Body mass index is 33.36 kg/m.        Physical Examination:   General appearance: Well appearing, and in no distress  Mental status: Alert, oriented to person, place, and time  Skin: Warm & dry  Cardiovascular: Normal heart rate noted  Respiratory: Normal respiratory effort, no distress  Abdomen: Soft, gravid, nontender  Pelvic: Cervical exam deferred         Extremities: Edema: None  Fetal Status: Fetal Heart Rate (bpm): 147 Fundal Height: 26 cm Movement: Present    Results for orders placed or performed in visit on 10/14/17 (from the past 24 hour(s))  POCT Urinalysis Dipstick   Collection Time: 10/14/17  9:36 AM  Result Value Ref Range   Color, UA     Clarity, UA     Glucose, UA neg    Bilirubin, UA     Ketones, UA neg    Spec Grav, UA  1.010 - 1.025   Blood, UA neg    pH, UA  5.0 - 8.0   Protein, UA neg    Urobilinogen, UA  0.2 or 1.0 E.U./dL   Nitrite, UA neg    Leukocytes, UA Negative Negative   Appearance     Odor       Orders Placed This Encounter  Procedures  . POCT Urinalysis Dipstick    Plan:  Continued routine obstetrical care, try unisom or benadryl.   Doing PN2  Return in about 3 weeks (around 11/04/2017) for LROB.

## 2017-10-14 NOTE — Patient Instructions (Addendum)
   Unisom or Benadryl for sleep

## 2017-10-15 LAB — CBC
HEMOGLOBIN: 12.1 g/dL (ref 11.1–15.9)
Hematocrit: 36.4 % (ref 34.0–46.6)
MCH: 32.9 pg (ref 26.6–33.0)
MCHC: 33.2 g/dL (ref 31.5–35.7)
MCV: 99 fL — ABNORMAL HIGH (ref 79–97)
Platelets: 276 10*3/uL (ref 150–379)
RBC: 3.68 x10E6/uL — AB (ref 3.77–5.28)
RDW: 13.5 % (ref 12.3–15.4)
WBC: 9.3 10*3/uL (ref 3.4–10.8)

## 2017-10-15 LAB — GLUCOSE TOLERANCE, 2 HOURS W/ 1HR
GLUCOSE, 1 HOUR: 95 mg/dL (ref 65–179)
GLUCOSE, FASTING: 74 mg/dL (ref 65–91)
Glucose, 2 hour: 102 mg/dL (ref 65–152)

## 2017-10-15 LAB — ANTIBODY SCREEN: ANTIBODY SCREEN: NEGATIVE

## 2017-10-15 LAB — HIV ANTIBODY (ROUTINE TESTING W REFLEX): HIV SCREEN 4TH GENERATION: NONREACTIVE

## 2017-10-15 LAB — RPR: RPR: NONREACTIVE

## 2017-11-05 ENCOUNTER — Ambulatory Visit (INDEPENDENT_AMBULATORY_CARE_PROVIDER_SITE_OTHER): Payer: Self-pay | Admitting: Obstetrics and Gynecology

## 2017-11-05 VITALS — BP 100/60 | HR 82 | Wt 171.0 lb

## 2017-11-05 DIAGNOSIS — Z3A28 28 weeks gestation of pregnancy: Secondary | ICD-10-CM

## 2017-11-05 DIAGNOSIS — Z331 Pregnant state, incidental: Secondary | ICD-10-CM

## 2017-11-05 DIAGNOSIS — Z1389 Encounter for screening for other disorder: Secondary | ICD-10-CM

## 2017-11-05 DIAGNOSIS — Z3483 Encounter for supervision of other normal pregnancy, third trimester: Secondary | ICD-10-CM

## 2017-11-05 LAB — POCT URINALYSIS DIPSTICK
Glucose, UA: NEGATIVE
Ketones, UA: NEGATIVE
LEUKOCYTES UA: NEGATIVE
NITRITE UA: NEGATIVE
PROTEIN UA: NEGATIVE
RBC UA: NEGATIVE

## 2017-11-05 NOTE — Progress Notes (Signed)
Patient ID: Samantha Khan, female   DOB: 1991-02-11, 27 y.o.   MRN: [id number]   LOW-RISK PREGNANCY VISIT Patient name: Samantha Khan MRN [id number]  Date of birth: 14-Apr-1991 Chief Complaint:   Routine Prenatal Visit  History of Present Illness:   Samantha Khan is a 27 y.o. G1P1001 female at [redacted]w[redacted]d with an Estimated Date of Delivery: 01/23/18 being seen today for ongoing management of a low-risk pregnancy.  Today she reports firmness to belly and reports that the baby is moving a lot.  . Vag. Bleeding: None.  Movement: Present. denies leaking of fluid. Review of Systems:   Pertinent items are noted in HPI Denies abnormal vaginal discharge w/ itching/odor/irritation, headaches, visual changes, shortness of breath, chest pain, abdominal pain, severe nausea/vomiting, or problems with urination or bowel movements unless otherwise stated above. Pertinent History Reviewed:  Reviewed past medical,surgical, social, obstetrical and family history.  Reviewed problem list, medications and allergies. Physical Assessment:   Vitals:   11/05/17 0845  BP: 100/60  Pulse: 82  Weight: 171 lb (77.6 kg)  Body mass index is 33.96 kg/m.        Physical Examination:   General appearance: Well appearing, and in no distress  Mental status: Alert, oriented to person, place, and time  Skin: Warm & dry  Cardiovascular: Normal heart rate noted  Respiratory: Normal respiratory effort, no distress  Abdomen: Soft, gravid, nontender  Pelvic: Cervical exam deferred         Extremities: Edema: None  Fetal Status: Fetal Heart Rate (bpm): 150 Fundal Height: 30 cm Movement: Present    Results for orders placed or performed in visit on 11/05/17 (from the past 24 hour(s))  POCT Urinalysis Dipstick   Collection Time: 11/05/17  8:47 AM  Result Value Ref Range   Color, UA     Clarity, UA     Glucose, UA neg    Bilirubin, UA     Ketones, UA neg    Spec Grav, UA  1.010 - 1.025   Blood, UA neg    pH, UA  5.0 - 8.0    Protein, UA neg    Urobilinogen, UA  0.2 or 1.0 E.U./dL   Nitrite, UA neg    Leukocytes, UA Negative Negative   Appearance     Odor      Assessment & Plan:  1) Low-risk pregnancy G2P1001 at [redacted]w[redacted]d with an Estimated Date of Delivery: 01/23/18    Meds: No orders of the defined types were placed in this encounter.  Labs/procedures today:   Plan:  Continue routine obstetrical care   Follow-up: Return in about 1 month (around 12/03/2017) for LROB.  Orders Placed This Encounter  Procedures  . POCT Urinalysis Dipstick   By signing my name below, I, Diona Browner, attest that this documentation has been prepared under the direction and in the presence of Tilda Burrow, MD. Electronically Signed: Diona Browner, Medical Scribe. 11/05/17. 9:36 AM.  I personally performed the services described in this documentation, which was SCRIBED in my presence. The recorded information has been reviewed and considered accurate. It has been edited as necessary during review. Tilda Burrow, MD

## 2017-12-03 ENCOUNTER — Other Ambulatory Visit: Payer: Self-pay

## 2017-12-03 ENCOUNTER — Encounter: Payer: Self-pay | Admitting: Advanced Practice Midwife

## 2017-12-03 ENCOUNTER — Ambulatory Visit (INDEPENDENT_AMBULATORY_CARE_PROVIDER_SITE_OTHER): Payer: Self-pay | Admitting: Advanced Practice Midwife

## 2017-12-03 VITALS — BP 112/64 | HR 87 | Wt 175.0 lb

## 2017-12-03 DIAGNOSIS — Z1389 Encounter for screening for other disorder: Secondary | ICD-10-CM

## 2017-12-03 DIAGNOSIS — Z3A32 32 weeks gestation of pregnancy: Secondary | ICD-10-CM

## 2017-12-03 DIAGNOSIS — Z3483 Encounter for supervision of other normal pregnancy, third trimester: Secondary | ICD-10-CM

## 2017-12-03 DIAGNOSIS — Z331 Pregnant state, incidental: Secondary | ICD-10-CM

## 2017-12-03 LAB — POCT URINALYSIS DIPSTICK
Glucose, UA: NEGATIVE
Ketones, UA: NEGATIVE
LEUKOCYTES UA: NEGATIVE
Nitrite, UA: NEGATIVE
Protein, UA: NEGATIVE
RBC UA: NEGATIVE

## 2017-12-03 MED ORDER — METRONIDAZOLE 500 MG PO TABS: 500.0000 mg | ORAL_TABLET | Freq: Two times a day (BID) | ORAL | 0 refills | Status: DC

## 2017-12-03 NOTE — Patient Instructions (Addendum)
Samantha Khan, I greatly value your feedback.  If you receive a survey following your visit with [place] today, we appreciate you taking the time to fill it out.  Thanks, Cathie Beams, CNM   Call the office [phone]) or go to [facility] if:  You begin to have strong, frequent contractions  Your water breaks.  Sometimes it is a big gush of fluid, sometimes it is just a trickle that keeps getting your panties wet or running down your legs  You have vaginal bleeding.  It is normal to have a small amount of spotting if your cervix was checked.   You don't feel your baby moving like normal.  If you don't, get you something to eat and drink and lay down and focus on feeling your baby move.  You should feel at least 10 movements in 2 hours.  If you don't, you should call the office or go to [facility] [address]    Tdap Vaccine  It is recommended that you get the Tdap vaccine during the third trimester of EACH pregnancy to help protect your baby from getting pertussis (whooping cough)  27-36 weeks is the BEST time to do this so that you can pass the protection on to your baby. During pregnancy is better than after pregnancy, but if you are unable to get it during pregnancy it will be offered at the hospital.   You can get this vaccine at the health department or your family doctor  Everyone who will be around your baby should also be up-to-date on their vaccines. Adults (who are not pregnant) only need 1 dose of Tdap during adulthood.   Third Trimester of Pregnancy The third trimester is from week 29 through week 42, months 7 through 9. The third trimester is a time when the fetus is growing rapidly. At the end of the ninth month, the fetus is about 20 inches in length and weighs 6-10 pounds.  BODY CHANGES Your body goes through many changes during pregnancy. The changes vary from woman to woman.   Your weight will continue to increase. You can expect to gain 25-35 pounds (11-16 kg) by  the end of the pregnancy.  You may begin to get stretch marks on your hips, abdomen, and breasts.  You may urinate more often because the fetus is moving lower into your pelvis and pressing on your bladder.  You may develop or continue to have heartburn as a result of your pregnancy.  You may develop constipation because certain hormones are causing the muscles that push waste through your intestines to slow down.  You may develop hemorrhoids or swollen, bulging veins (varicose veins).  You may have pelvic pain because of the weight gain and pregnancy hormones relaxing your joints between the bones in your pelvis. Backaches may result from overexertion of the muscles supporting your posture.  You may have changes in your hair. These can include thickening of your hair, rapid growth, and changes in texture. Some women also have hair loss during or after pregnancy, or hair that feels dry or thin. Your hair will most likely return to normal after your baby is born.  Your breasts will continue to grow and be tender. A yellow discharge may leak from your breasts called colostrum.  Your belly button may stick out.  You may feel short of breath because of your expanding uterus.  You may notice the fetus "dropping," or moving lower in your abdomen.  You may have a bloody mucus discharge.  This usually occurs a few days to a week before labor begins.  Your cervix becomes thin and soft (effaced) near your due date. WHAT TO EXPECT AT YOUR PRENATAL EXAMS  You will have prenatal exams every 2 weeks until week 36. Then, you will have weekly prenatal exams. During a routine prenatal visit:  You will be weighed to make sure you and the fetus are growing normally.  Your blood pressure is taken.  Your abdomen will be measured to track your baby's growth.  The fetal heartbeat will be listened to.  Any test results from the previous visit will be discussed.  You may have a cervical check near your  due date to see if you have effaced. At around 36 weeks, your caregiver will check your cervix. At the same time, your caregiver will also perform a test on the secretions of the vaginal tissue. This test is to determine if a type of bacteria, Group B streptococcus, is present. Your caregiver will explain this further. Your caregiver may ask you:  What your birth plan is.  How you are feeling.  If you are feeling the baby move.  If you have had any abnormal symptoms, such as leaking fluid, bleeding, severe headaches, or abdominal cramping.  If you have any questions. Other tests or screenings that may be performed during your third trimester include:  Blood tests that check for low iron levels (anemia).  Fetal testing to check the health, activity level, and growth of the fetus. Testing is done if you have certain medical conditions or if there are problems during the pregnancy. FALSE LABOR You may feel small, irregular contractions that eventually go away. These are called Braxton Hicks contractions, or false labor. Contractions may last for hours, days, or even weeks before true labor sets in. If contractions come at regular intervals, intensify, or become painful, it is best to be seen by your caregiver.  SIGNS OF LABOR   Menstrual-like cramps.  Contractions that are 5 minutes apart or less.  Contractions that start on the top of the uterus and spread down to the lower abdomen and back.  A sense of increased pelvic pressure or back pain.  A watery or bloody mucus discharge that comes from the vagina. If you have any of these signs before the 37th week of pregnancy, call your caregiver right away. You need to go to the hospital to get checked immediately. HOME CARE INSTRUCTIONS   Avoid all smoking, herbs, alcohol, and unprescribed drugs. These chemicals affect the formation and growth of the baby.  Follow your caregiver's instructions regarding medicine use. There are medicines  that are either safe or unsafe to take during pregnancy.  Exercise only as directed by your caregiver. Experiencing uterine cramps is a good sign to stop exercising.  Continue to eat regular, healthy meals.  Wear a good support bra for breast tenderness.  Do not use hot tubs, steam rooms, or saunas.  Wear your seat belt at all times when driving.  Avoid raw meat, uncooked cheese, cat litter boxes, and soil used by cats. These carry germs that can cause birth defects in the baby.  Take your prenatal vitamins.  Try taking a stool softener (if your caregiver approves) if you develop constipation. Eat more high-fiber foods, such as fresh vegetables or fruit and whole grains. Drink plenty of fluids to keep your urine clear or pale yellow.  Take warm sitz baths to soothe any pain or discomfort caused by hemorrhoids. Use hemorrhoid  cream if your caregiver approves.  If you develop varicose veins, wear support hose. Elevate your feet for 15 minutes, 3-4 times a day. Limit salt in your diet.  Avoid heavy lifting, wear low heal shoes, and practice good posture.  Rest a lot with your legs elevated if you have leg cramps or low back pain.  Visit your dentist if you have not gone during your pregnancy. Use a soft toothbrush to brush your teeth and be gentle when you floss.  A sexual relationship may be continued unless your caregiver directs you otherwise.  Do not travel far distances unless it is absolutely necessary and only with the approval of your caregiver.  Take prenatal classes to understand, practice, and ask questions about the labor and delivery.  Make a trial run to the hospital.  Pack your hospital bag.  Prepare the baby's nursery.  Continue to go to all your prenatal visits as directed by your caregiver. SEEK MEDICAL CARE IF:  You are unsure if you are in labor or if your water has broken.  You have dizziness.  You have mild pelvic cramps, pelvic pressure, or nagging  pain in your abdominal area.  You have persistent nausea, vomiting, or diarrhea.  You have a bad smelling vaginal discharge.  You have pain with urination. SEEK IMMEDIATE MEDICAL CARE IF:   You have a fever.  You are leaking fluid from your vagina.  You have spotting or bleeding from your vagina.  You have severe abdominal cramping or pain.  You have rapid weight loss or gain.  You have shortness of breath with chest pain.  You notice sudden or extreme swelling of your face, hands, ankles, feet, or legs.  You have not felt your baby move in over an hour.  You have severe headaches that do not go away with medicine.  You have vision changes. Document Released: 06/17/2001 Document Revised: 06/28/2013 Document Reviewed: 08/24/2012 [facility] Patient Information 2015 [place], [place]. This information is not intended to replace advice given to you by your health care provider. Make sure you discuss any questions you have with your health care provider.   Vaginosis bacteriana Bacterial Vaginosis La vaginosis bacteriana es una infeccin vaginal que perturba el equilibrio normal de las bacterias que se encuentran en la vagina. Es el resultado de un crecimiento excesivo de ciertas bacterias. Es la infeccin vaginal ms frecuente en mujeres de [id number]. Debido a que la vaginosis bacteriana aumenta el riesgo de contraerETS(enfermedades de transmisin sexual), el tratamiento puede ayudar a reducir el riesgo de contraer clamidia, gonorrea, herpes y VIH(virus de inmunodeficiencia humana). Adems, el tratamiento es importante para prevenir complicaciones en embarazadas, ya que esta afeccin puede provocar un parto antes de tiempo(prematuro). Cules son las causas? Esta afeccin se origina por un aumento de bacterias nocivas que, generalmente, estn presentes en cantidades pequeas en la vagina. Sin embargo, la causa de su desarrollo no se comprende totalmente. Qu incrementa el  riesgo? Los siguientes factores pueden hacer que usted sea ms propenso a tener esta enfermedad:  Tener una nueva pareja sexual o mltiples parejas sexuales.  Mantener relaciones sexuales sin proteccin.  Las [employer].  Tener colocado un dispositivo intrauterino(DIU).  Fumar.  Consumo excesivo de drogas y alcohol.  Tomar ciertos antibiticos.  Estar embarazada.  El contagio no se produce en baos, por ropas de cama, en piscinas ni por contacto con objetos. Cules son los signos o los sntomas? Los sntomas de esta afeccin incluyen los siguientes:  Secrecin vaginal de color  gris o blanco. La secrecin tambin puede ser acuosa o espumosa.  Secrecin vaginal con olor similar al [employer]; en especial, despus de Art gallery manager sexuales o durante los perodos [place].  Picazn en la vagina y alrededor de esta.  Ardor o dolor al [employer].  Algunas mujeres que padecen vaginosis bacteriana no presentan signos ni sntomas. Cmo se diagnostica? Esta afeccin se diagnostica en funcin de lo siguiente:  Sus antecedentes mdicos.  Examen fsico de la vagina.  [place] de [place] de fluido vaginal con un microscopio para determinar si hay una gran cantidad de bacterias nocivas o de clulas anormales. El mdico podra Chemical engineer un hisopo de algodn o una pequea esptula de madera para Building services engineer Term[place].  Cmo se trata? Esta afeccin se trata con antibiticos. Podran administrarse en forma de pastillas, de una crema vaginal o de un medicamento que se coloca dentro de la vagina(vulo vaginal). Si la afeccin se repite despus del tratamiento, podra indicarse una segunda tanda de antibiticos. Siga estas indicaciones en su casa: Medicamentos  [employer] de venta libre y los recetados solamente como se lo haya indicado el mdico.  Tome o utilice los antibiticos como se lo haya indicado el mdico. No deje de tomar ni de usar los antibiticos  aunque comience a Actor. Instrucciones generales  Si tiene una pareja sexual mujer, avsele que sufre una infeccin vaginal. Samson Frederic debera consultar con su mdico y recibir tratamiento si tuviera sntomas. Si tiene una pareja sexual hombre, l no necesita tratamiento.  Durante el tratamiento: ? Evite la actividad sexual hasta que haya finalizado el [place]. ? No se haga duchas vaginales. ? Evite consumir alcohol como se lo haya indicado el mdico. ? Evite amamantar como se lo haya indicado el mdico.  Beba gran cantidad de lquido para mantener la orina de tono claro o color amarillo plido.  Mantenga limpia la zona que rodea la vagina y Administrator. ? Lave la zona diariamente con agua tibia. ? Cuando vaya al bao, siempre higiencese desde adelante hacia atrs.  Concurra a todas las visitas de control como se lo haya indicado el mdico. Esto es importante. Cmo se evita?  No se haga duchas vaginales.  Higiencese la parte exterior de la vagina solo con agua tibia.  Utilice proteccin cuando [employer] Por ejemplo, preservativos de ltex y protectores bucales.  Limite la cantidad de parejas sexuales. Para prevenir la vaginosis bacteriana, es mejor [employer] sexuales solo con una pareja(monogamia).  Es importante que usted y su pareja sexual se realicen estudios de Airline pilot de ETS.  Use ropa interior de algodn o con revestimiento de algodn.  No use pantalones ni pantis ajustados; en especial, durante el verano.  Limite la cantidad de alcohol que bebe.  No consuma ningn producto que contenga nicotina o tabaco, como cigarrillos y Administrator, Civil Service. Si necesita ayuda para dejar de fumar, consulte al mdico.  No consuma drogas. Dnde encontrar ms informacin:  Centros para el control y la prevencin de enfermedades(Centers for Disease Control and Prevention):www.cdc.gov/std  Asociacin Estadounidense de la Salud Sexual  (American Sexual Health Association, ASHA): www.ashastd.org  Departamento de Salud y [address] de los [place], [place] de Salud de la Mujer(U.S. Department of Health and [employer], Office on [employer] [address]): ConventionalMedicines.si o http://www.anderson-williamson.info/ Comunquese con un mdico si:  Los sntomas no mejoran, ni siquiera despus del tratamiento.  Tiene ms secrecin o siente dolor al [employer].  Tiene fiebre.  Siente dolor en el abdomen.  Siente dolor  durante las [employer].  Tiene sangrado vaginal entre los periodos [employer]. Resumen  La vaginosis bacteriana es una infeccin vaginal que perturba el equilibrio normal de las bacterias que se encuentran en la vagina.  Debido a que la vaginosis bacteriana aumenta el riesgo de contraerETS(enfermedades de transmisin sexual), el tratamiento puede ayudar a reducir el riesgo de contraer clamidia, gonorrea, herpes y VIH(virus de inmunodeficiencia humana). Adems, el tratamiento es importante para prevenir complicaciones en embarazadas, ya que esta afeccin puede provocar un parto antes de tiempo(prematuro).  Esta afeccin se trata con antibiticos. Podran administrarse en forma de pastillas, de una crema vaginal o de un medicamento que se coloca dentro de la vagina(vulo vaginal). Esta informacin no tiene Theme park manager el consejo del mdico. Asegrese de hacerle al mdico cualquier pregunta que tenga. Document Released: 09/30/2007 Document Revised: 10/29/2016 Document Reviewed: 02/02/2013 Elsevier Interactive Patient Education  [employer].

## 2017-12-03 NOTE — Progress Notes (Signed)
  G2P1001 [redacted]w[redacted]d Estimated Date of Delivery: 01/23/18  Blood pressure 112/64, pulse 87, weight 175 lb (79.4 kg).   BP weight and urine results all reviewed and noted.  Please refer to the obstetrical flow sheet for the fundal height and fetal heart rate documentation:  Patient reports good fetal movement, denies any bleeding and no rupture of membranes symptoms or regular contractions. Patient has noticed some green discharge w/o other sx.  All questions were answered.   Physical Assessment:   Vitals:   12/03/17 1112  BP: 112/64  Pulse: 87  Weight: 175 lb (79.4 kg)  Body mass index is 34.75 kg/m.        Physical Examination:   General appearance: Well appearing, and in no distress  Mental status: Alert, oriented to person, place, and time  Skin: Warm & dry  Cardiovascular: Normal heart rate noted  Respiratory: Normal respiratory effort, no distress  Abdomen: Soft, gravid, nontender  Pelvic: SSE:  frothy white DC. No trich, + clue, few WBCs , no yeast         Extremities: Edema: Trace  Fetal Status:     Movement: Present    Results for orders placed or performed in visit on 12/03/17 (from the past 24 hour(s))  POCT urinalysis dipstick   Collection Time: 12/03/17 11:16 AM  Result Value Ref Range   Color, UA     Clarity, UA     Glucose, UA Negative Negative   Bilirubin, UA     Ketones, UA neg    Spec Grav, UA  1.010 - 1.025   Blood, UA neg    pH, UA  5.0 - 8.0   Protein, UA Negative Negative   Urobilinogen, UA  0.2 or 1.0 E.U./dL   Nitrite, UA neg    Leukocytes, UA Negative Negative   Appearance     Odor       Orders Placed This Encounter  Procedures  . POCT urinalysis dipstick    Plan:  Continued routine obstetrical care, rx flagyl  Return in about 2 weeks (around 12/17/2017) for LROB.

## 2017-12-17 ENCOUNTER — Ambulatory Visit: Payer: Self-pay | Admitting: Advanced Practice Midwife

## 2017-12-22 ENCOUNTER — Encounter: Payer: Self-pay | Admitting: Advanced Practice Midwife

## 2017-12-23 ENCOUNTER — Ambulatory Visit (INDEPENDENT_AMBULATORY_CARE_PROVIDER_SITE_OTHER): Payer: Self-pay | Admitting: Advanced Practice Midwife

## 2017-12-23 ENCOUNTER — Encounter: Payer: Self-pay | Admitting: Advanced Practice Midwife

## 2017-12-23 VITALS — BP 89/52 | HR 87 | Wt 175.3 lb

## 2017-12-23 DIAGNOSIS — Z3A35 35 weeks gestation of pregnancy: Secondary | ICD-10-CM

## 2017-12-23 DIAGNOSIS — Z3483 Encounter for supervision of other normal pregnancy, third trimester: Secondary | ICD-10-CM

## 2017-12-23 DIAGNOSIS — Z331 Pregnant state, incidental: Secondary | ICD-10-CM

## 2017-12-23 DIAGNOSIS — Z1389 Encounter for screening for other disorder: Secondary | ICD-10-CM

## 2017-12-23 LAB — POCT URINALYSIS DIPSTICK
Blood, UA: NEGATIVE
GLUCOSE UA: NEGATIVE
KETONES UA: NEGATIVE
Leukocytes, UA: NEGATIVE
Nitrite, UA: NEGATIVE
Protein, UA: NEGATIVE

## 2017-12-23 NOTE — Addendum Note (Signed)
Addended by: Federico FlakeNES, Zayah Keilman A on: 12/23/2017 11:52 AM   Modules accepted: Orders

## 2017-12-23 NOTE — Patient Instructions (Addendum)
  For your lower back pain you may:  Purchase a pregnancy belt from Target, Amazon, [employer], etc and wear it while you are up and about  Take warm baths  Use a heating pad to your lower back for no longer than 20 minutes at a time, and do not place near abdomen  Take tylenol as needed. Please follow directions on the bottle  Kinesiology tape (can get from sporting goods store), google how to tape belly for pregnancy   AM I IN LABOR? What is labor? Labor is the work that your body does to birth your baby. Your uterus (the womb) contracts. Your cervix (the mouth of the uterus) opens. You will push your baby out into the world.  What do contractions (labor pains) feel like? When they first start, contractions usually feel like cramps during your period. Sometimes you feel pain in your back. Most often, contractions feel like muscles pulling painfully in your lower belly. At first, the contractions will probably be 15 to 20 minutes apart. They will not feel too painful. As labor goes on, the contractions get stronger, closer together, and more painful.  How do I time the contractions? Time your contractions by counting the number of minutes from the start of one contraction to the start of the next contraction.  What should I do when the contractions start? If it is night and you can sleep, sleep. If it happens during the day, here are some things you can do to take care of yourself at home: ? Walk. If the pains you are having are real labor, walking will make the contractions come faster and harder. If the contractions are not going to continue and be real labor, walking will make the contractions slow down. ? Take a shower or bath. This will help you relax. ? Eat. Labor is a big event. It takes a lot of energy. ? Drink water. Not drinking enough water can cause false labor (contractions that hurt but do not open your cervix). If this is true labor, drinking water will help you  have strength to get through your labor. ? Take a nap. Get all the rest you can. ? Get a massage. If your labor is in your back, a strong massage on your lower back may feel very good. Getting a foot massage is always good. ? Don't panic. You can do this. Your body was made for this. You are strong!  When should I go to the hospital or call my health care provider? ? Your contractions have been 5 minutes apart or less for at least 1 hour. ? If several contractions are so painful you cannot walk or talk during one. ? Your bag of waters breaks. (You may have a big gush of water or just water that runs down your legs when you walk.)  Are there other reasons to call my health care provider? Yes, you should call your health care provider or go to the hospital if you start to bleed like you are having a period- blood that soaks your underwear or runs down your legs, if you have sudden severe pain, if your baby has not moved for several hours, or if you are leaking green fluid. The rule is as follows: If you are very concerned about something, call.

## 2017-12-23 NOTE — Progress Notes (Signed)
  G2P1001 4541w4d Estimated Date of Delivery: 01/23/18  Blood pressure (!) 89/52, pulse 87, weight 175 lb 4.8 oz (79.5 kg).   BP weight and urine results all reviewed and noted.  Please refer to the obstetrical flow sheet for the fundal height and fetal heart rate documentation:  Patient reports good fetal movement, denies any bleeding and no rupture of membranes symptoms or regular contractions. Patient is without complaints.except LBP Tips given All questions were answered.   Physical Assessment:   Vitals:   12/23/17 1059  BP: (!) 89/52  Pulse: 87  Weight: 175 lb 4.8 oz (79.5 kg)  Body mass index is 34.81 kg/m.        Physical Examination:   General appearance: Well appearing, and in no distress  Mental status: Alert, oriented to person, place, and time  Skin: Warm & dry  Cardiovascular: Normal heart rate noted  Respiratory: Normal respiratory effort, no distress  Abdomen: Soft, gravid, nontender  Pelvic: Dilation: 3 Effacement (%): Thick Station: -3  Extremities: Edema: None  Fetal Status: Fetal Heart Rate (bpm): 150 Fundal Height: 35 cm Movement: Present Presentation: Vertex  No results found for this or any previous visit (from the past 24 hour(s)).   No orders of the defined types were placed in this encounter.   Plan:  Continued routine obstetrical care,   Return in about 1 week (around 12/30/2017) for LROB.

## 2017-12-31 ENCOUNTER — Ambulatory Visit (INDEPENDENT_AMBULATORY_CARE_PROVIDER_SITE_OTHER): Payer: Self-pay | Admitting: Obstetrics and Gynecology

## 2017-12-31 VITALS — BP 94/55 | HR 82 | Wt 175.0 lb

## 2017-12-31 DIAGNOSIS — Z331 Pregnant state, incidental: Secondary | ICD-10-CM

## 2017-12-31 DIAGNOSIS — Z3A36 36 weeks gestation of pregnancy: Secondary | ICD-10-CM

## 2017-12-31 DIAGNOSIS — Z1389 Encounter for screening for other disorder: Secondary | ICD-10-CM

## 2017-12-31 DIAGNOSIS — Z3483 Encounter for supervision of other normal pregnancy, third trimester: Secondary | ICD-10-CM

## 2017-12-31 NOTE — Patient Instructions (Signed)

## 2017-12-31 NOTE — Progress Notes (Signed)
[id number]  [facility]  Chief Complaint  Patient presents with  . Routine Prenatal Visit  ____Here with translator  Patient complaints: Slight white discharge otherwise negative. Patient reports   good fetal movement,                           denies any bleeding , rupture of membranes,or regular contractions.  Blood pressure (!) 94/55, pulse 82, weight 175 lb (79.4 kg).   Urine results:notable for negative protein glucose nitrites refer to the ob flow sheet for FH and FHR, ,                          Physical Examination: General appearance - alert, well appearing, and in no distress, oriented to person, place, and time and normal appearing weight                                      Abdomen - FH 37,                                                         -FHR 153                                                         soft, nontender, nondistended, no masses or organomegaly Gravid uterus fetus visibly active fetal body on the right side of maternal spine cephalic presentation                                      Pelvic -B strep GC and Chlamydia collected, cervix posterior soft 1 cm presenting part high definitely vertex                                            Questions were answered. Assessment: LROB G2P1001 @ 8298w5d low risk OB no complications noted  Plan:  Continued routine obstetrical care, plans to breast-feed  F/u in 1 weeks for low risk OB

## 2018-01-02 LAB — GC/CHLAMYDIA PROBE AMP
Chlamydia trachomatis, NAA: NEGATIVE
NEISSERIA GONORRHOEAE BY PCR: NEGATIVE

## 2018-01-05 LAB — CULTURE, BETA STREP (GROUP B ONLY): Strep Gp B Culture: NEGATIVE

## 2018-01-06 ENCOUNTER — Ambulatory Visit (INDEPENDENT_AMBULATORY_CARE_PROVIDER_SITE_OTHER): Payer: Self-pay | Admitting: Advanced Practice Midwife

## 2018-01-06 ENCOUNTER — Encounter: Payer: Self-pay | Admitting: Advanced Practice Midwife

## 2018-01-06 VITALS — BP 94/61 | HR 91 | Wt 178.0 lb

## 2018-01-06 DIAGNOSIS — Z1389 Encounter for screening for other disorder: Secondary | ICD-10-CM

## 2018-01-06 DIAGNOSIS — Z3A37 37 weeks gestation of pregnancy: Secondary | ICD-10-CM

## 2018-01-06 DIAGNOSIS — Z331 Pregnant state, incidental: Secondary | ICD-10-CM

## 2018-01-06 DIAGNOSIS — Z3483 Encounter for supervision of other normal pregnancy, third trimester: Secondary | ICD-10-CM

## 2018-01-06 LAB — POCT URINALYSIS DIPSTICK
Blood, UA: NEGATIVE
Glucose, UA: NEGATIVE
Glucose, UA: NEGATIVE
Ketones, UA: NEGATIVE
NITRITE UA: NEGATIVE
PROTEIN UA: NEGATIVE
PROTEIN UA: NEGATIVE

## 2018-01-06 NOTE — Progress Notes (Signed)
Green vaginal discharge with no odor. 

## 2018-01-06 NOTE — Patient Instructions (Signed)

## 2018-01-06 NOTE — Progress Notes (Signed)
LOW-RISK PREGNANCY VISIT Patient name: Samantha Khan Prescher MRN [id number]  Date of birth: 11/22/1990 Chief Complaint:   Routine Prenatal Visit (low back pain and low abd pain)  History of Present Illness:   Samantha Khan Wendt is a 27 y.o. 362P1001 female at 8773w4d with an Estimated Date of Delivery: 01/23/18 being seen today for ongoing management of a low-risk pregnancy.  Today she reports perisitant green dc, no irritation/itch. Had metrogel, didn't do anything. . Contractions: Irregular. Vag. Bleeding: None.  Movement: Present. denies leaking of fluid. Review of Systems:   Pertinent items are noted in HPI Denies abnormal vaginal discharge w/ itching/odor/irritation, headaches, visual changes, shortness of breath, chest pain, abdominal pain, severe nausea/vomiting, or problems with urination or bowel movements unless otherwise stated above.  Pertinent History Reviewed:  Medical & Surgical Hx:   Past Medical History:  Diagnosis Date  . Abnormal Pap smear of cervix   . Anal fissure 02/2013  . Medical history non-contributory   . Pregnancy    Past Surgical History:  Procedure Laterality Date  . COLPOSCOPY  03/2011  . NO PAST SURGERIES     Family History  Problem Relation Age of Onset  . Hypertension Maternal Grandmother   . Diabetes Maternal Grandmother   . Diabetes Paternal Grandmother   . Diabetes Paternal Grandfather   . Diabetes Maternal Grandfather     Current Outpatient Medications:  .  Acetaminophen (TYLENOL PO), Take by mouth as needed., Disp: , Rfl:  .  Prenatal Multivit-Min-Fe-FA (PRENATAL VITAMINS) 0.8 MG tablet, Take 1 tablet by mouth daily., Disp: 30 tablet, Rfl: 12 Social History: Reviewed -  reports that she has never smoked. She has never used smokeless tobacco.  Physical Assessment:   Vitals:   01/06/18 1129  BP: 94/61  Pulse: 91  Weight: 178 lb (80.7 kg)  Body mass index is 35.35 kg/m.        Physical Examination:   General appearance: Well appearing, and in  no distress  Mental status: Alert, oriented to person, place, and time  Skin: Warm & dry  Cardiovascular: Normal heart rate noted  Respiratory: Normal respiratory effort, no distress  Abdomen: Soft, gravid, nontender  Pelvic: Cervical exam deferred         Extremities: Edema: Trace  Fetal Status:     Movement: Present    Results for orders placed or performed in visit on 01/06/18 (from the past 24 hour(s))  POCT urinalysis dipstick   Collection Time: 01/06/18 11:30 AM  Result Value Ref Range   Color, UA     Clarity, UA     Glucose, UA Negative Negative   Bilirubin, UA     Ketones, UA neg    Spec Grav, UA  1.010 - 1.025   Blood, UA neg    pH, UA  5.0 - 8.0   Protein, UA Negative Negative   Urobilinogen, UA  0.2 or 1.0 E.U./dL   Nitrite, UA neg    Leukocytes, UA Large (3+) (A) Negative   Appearance     Odor    Results for orders placed or performed in visit on 12/31/17 (from the past 24 hour(s))  POCT Urinalysis Dipstick   Collection Time: 01/06/18 11:31 AM  Result Value Ref Range   Color, UA     Clarity, UA     Glucose, UA Negative Negative   Bilirubin, UA     Ketones, UA     Spec Grav, UA  1.010 - 1.025   Blood, UA  pH, UA  5.0 - 8.0   Protein, UA Negative Negative   Urobilinogen, UA  0.2 or 1.0 E.U./dL   Nitrite, UA     Leukocytes, UA  Negative   Appearance     Odor      Assessment & Plan:  1) Low-risk pregnancy G2P1001 at [redacted]w[redacted]d with an Estimated Date of Delivery: 01/23/18   2) , leukorrhea, non infectious   Labs/procedures/US today: none  Plan:  Continue routine obstetrical care    Follow-up: Return in about 1 week (around 01/13/2018) for LROB.  Orders Placed This Encounter  Procedures  . POCT urinalysis dipstick   Jacklyn Shell CNM 01/06/2018 11:52 AM

## 2018-01-13 ENCOUNTER — Ambulatory Visit (INDEPENDENT_AMBULATORY_CARE_PROVIDER_SITE_OTHER): Payer: Self-pay | Admitting: Advanced Practice Midwife

## 2018-01-13 VITALS — BP 98/60 | HR 73 | Wt 178.0 lb

## 2018-01-13 DIAGNOSIS — Z3483 Encounter for supervision of other normal pregnancy, third trimester: Secondary | ICD-10-CM

## 2018-01-13 DIAGNOSIS — Z331 Pregnant state, incidental: Secondary | ICD-10-CM

## 2018-01-13 DIAGNOSIS — Z1389 Encounter for screening for other disorder: Secondary | ICD-10-CM

## 2018-01-13 DIAGNOSIS — Z3A38 38 weeks gestation of pregnancy: Secondary | ICD-10-CM

## 2018-01-13 LAB — POCT URINALYSIS DIPSTICK
Glucose, UA: NEGATIVE
Ketones, UA: NEGATIVE
LEUKOCYTES UA: NEGATIVE
NITRITE UA: NEGATIVE
PROTEIN UA: NEGATIVE
RBC UA: NEGATIVE

## 2018-01-13 NOTE — Progress Notes (Signed)
   LOW-RISK PREGNANCY VISIT Patient name: Samantha Khan MRN [id number]  Date of birth: 11/19/1990 Chief Complaint:   Routine Prenatal Visit  History of Present Illness:   Samantha Khan is a 27 y.o. 602P1001 female at 4453w4d with an Estimated Date of Delivery: 01/23/18 being seen today for ongoing management of a low-risk pregnancy.  Today she reports no complaints. Contractions: Not present. Vag. Bleeding: None.  Movement: Present. denies leaking of fluid. Review of Systems:   Pertinent items are noted in HPI Denies abnormal vaginal discharge w/ itching/odor/irritation, headaches, visual changes, shortness of breath, chest pain, abdominal pain, severe nausea/vomiting, or problems with urination or bowel movements unless otherwise stated above.  Pertinent History Reviewed:  Medical & Surgical Hx:   Past Medical History:  Diagnosis Date  . Abnormal Pap smear of cervix   . Anal fissure 02/2013  . Medical history non-contributory   . Pregnancy    Past Surgical History:  Procedure Laterality Date  . COLPOSCOPY  03/2011  . NO PAST SURGERIES     Family History  Problem Relation Age of Onset  . Hypertension Maternal Grandmother   . Diabetes Maternal Grandmother   . Diabetes Paternal Grandmother   . Diabetes Paternal Grandfather   . Diabetes Maternal Grandfather     Current Outpatient Medications:  .  Acetaminophen (TYLENOL PO), Take by mouth as needed., Disp: , Rfl:  .  Prenatal Multivit-Min-Fe-FA (PRENATAL VITAMINS) 0.8 MG tablet, Take 1 tablet by mouth daily., Disp: 30 tablet, Rfl: 12 Social History: Reviewed -  reports that she has never smoked. She has never used smokeless tobacco.  Physical Assessment:   Vitals:   01/13/18 0845  BP: 98/60  Pulse: 73  Weight: 178 lb (80.7 kg)  Body mass index is 35.35 kg/m.        Physical Examination:   General appearance: Well appearing, and in no distress  Mental status: Alert, oriented to person, place, and time  Skin: Warm &  dry  Cardiovascular: Normal heart rate noted  Respiratory: Normal respiratory effort, no distress  Abdomen: Soft, gravid, nontender  Pelvic: Cervical exam performed         Extremities: Edema: Trace  Fetal Status:     Movement: Present    Results for orders placed or performed in visit on 01/13/18 (from the past 24 hour(s))  POCT Urinalysis Dipstick   Collection Time: 01/13/18  8:49 AM  Result Value Ref Range   Color, UA     Clarity, UA     Glucose, UA Negative Negative   Bilirubin, UA     Ketones, UA neg    Spec Grav, UA  1.010 - 1.025   Blood, UA neg    pH, UA  5.0 - 8.0   Protein, UA Negative Negative   Urobilinogen, UA  0.2 or 1.0 E.U./dL   Nitrite, UA neg    Leukocytes, UA Negative Negative   Appearance     Odor      Assessment & Plan:  1) Low-risk pregnancy G2P1001 at 5453w4d with an Estimated Date of Delivery: 01/23/18   2) ,    Labs/procedures/US today:   Plan:  Continue routine obstetrical care    Follow-up: Return in about 1 week (around 01/20/2018) for LROB.  Orders Placed This Encounter  Procedures  . POCT Urinalysis Dipstick   Jacklyn ShellFrances Cresenzo-Dishmon CNM 01/13/2018 8:58 AM

## 2018-01-20 ENCOUNTER — Ambulatory Visit (INDEPENDENT_AMBULATORY_CARE_PROVIDER_SITE_OTHER): Payer: Self-pay | Admitting: Advanced Practice Midwife

## 2018-01-20 ENCOUNTER — Encounter: Payer: Self-pay | Admitting: Advanced Practice Midwife

## 2018-01-20 VITALS — BP 94/57 | HR 80 | Wt 181.0 lb

## 2018-01-20 DIAGNOSIS — Z1389 Encounter for screening for other disorder: Secondary | ICD-10-CM

## 2018-01-20 DIAGNOSIS — Z3A39 39 weeks gestation of pregnancy: Secondary | ICD-10-CM

## 2018-01-20 DIAGNOSIS — Z3483 Encounter for supervision of other normal pregnancy, third trimester: Secondary | ICD-10-CM

## 2018-01-20 DIAGNOSIS — O480 Post-term pregnancy: Secondary | ICD-10-CM

## 2018-01-20 DIAGNOSIS — Z331 Pregnant state, incidental: Secondary | ICD-10-CM

## 2018-01-20 LAB — POCT URINALYSIS DIPSTICK
Glucose, UA: NEGATIVE
KETONES UA: NEGATIVE
LEUKOCYTES UA: NEGATIVE
NITRITE UA: NEGATIVE
PROTEIN UA: NEGATIVE
RBC UA: NEGATIVE

## 2018-01-20 NOTE — Patient Instructions (Signed)
Estoy en labor de parto?   Que es labor de parto?  El proceso de parto(labor) es el trabajo de su cuerpo para dar a luz a su bebe. Su matriz se contrae. El cuello uterino se abre y usted pujara su bebe ha  cia el mundo.  Como se sienten las contracciones? (dolores del parto)  Al principio de labor de parto las contracciones generalmente se sienten como el colico de las reglas mensuales. Algunas veces puede haber dolor en la espalda. Mas comunmente, las contracciones se sienten como dolores en la parte baja de la barriga que van y vienen con un ritmo regular. Al comienzo del labor de parto pueden que pasen de 15 a 20 minutos entre contracciones y que no se sientan muy dolorosas. Mediante el parto progresa, las contracciones seran m  as y mas fuertesyfrecuentes, y mas dolorosas. Como cuento la frecuencia de mis contracciones?  La frecuencia de las contracciones es el numero de minutos que pasan desde el principio de una contraccion hasta el principio de la siguiente contraccion . Que debo hacer cuando las contracciones empiezan?  Si es de noche y puede dormir, duerma. Si sus contracciones de parto comienzan durante el d?a o si no puede dormir, aqu? hay varias cosas que puede hacer mientras estas en casa:  Caminar. Si los dolores que siente son el comienzo del labor de parto, el caminar causara que las contracciones vengan mas frecuentemente y que se vuelvan mas fuertes. De otras palabras, esto ayudara a  que progrese tu labor. Sin embardo, si al caminar las contracciones disminuyen en frecuencia o intensidad, es probable que las contracciones no son contracciones de parto.  Tome  se una ducha o un bano~ . Esto ayudara a que se relaje.  Coma. El proceso del parto es un evento grande. Su cuerpo necesita mucha energ?a.  Tome agua. Si las contracciones no son de parto (parto falso), el tomar agua ayudara a  disminuirlas. Si las contracciones son de parto real, el tomar agua  ayudara a que usted se mantenga fuerte durante el parto.  Duerma/Tome una siesta. Obtenga todo el descanso que pueda.  Consiga que alguien le de un masaje. Si sus dolores de parto son en su espalda, un fuerte masaje en la espalda le ayudara a aliviar el dolor. Un masaje de pies tambien puede ser bueno durante el labor de parto.  No se asuste/relajese. Usted puede parir. Recuerde que usted es fuerte y que su cuerpo esta he  cho para esto. Cuando debo ir al hospital o llamar a mi proveedor de atenci  on?   Si sus contracciones han tenido una frecuencia de cada 5 minutos o mas o menos por una hora o mas.  Si sus contracciones son tan dolorosas que no puede caminar o hablar durante ellas.  Si su fuente de agua se rompe. (Esto puede ser un gran chorro de agua o simplemente un poco de agua que escurre por sus piernas al caminar.) Hay otras razones por las cuales debo llamar mi proveedor de atencion o ir al hospital?  S?, debe llamar a su proveedor de atencion o ir a  l hospital si:  Tiene sangrado como una regla/periodo menstrual o mas (si tiene que usar una toalla femenina para evitar que se mojen sus ropas?ntimas).  

## 2018-01-20 NOTE — Progress Notes (Signed)
  G2P1001 2235w4d Estimated Date of Delivery: 01/23/18  Blood pressure (!) 94/57, pulse 80, weight 181 lb (82.1 kg).   BP weight and urine results all reviewed and noted.  Please refer to the obstetrical flow sheet for the fundal height and fetal heart rate documentation:  Patient reports good fetal movement, denies any bleeding and no rupture of membranes symptoms or regular contractions. Patient has back pain and pressure All questions were answered.   Physical Assessment:   Vitals:   01/20/18 0851  BP: (!) 94/57  Pulse: 80  Weight: 181 lb (82.1 kg)  Body mass index is 35.95 kg/m.        Physical Examination:   General appearance: Well appearing, and in no distress  Mental status: Alert, oriented to person, place, and time  Skin: Warm & dry  Cardiovascular: Normal heart rate noted  Respiratory: Normal respiratory effort, no distress  Abdomen: Soft, gravid, nontender  Pelvic: Cervical exam performed  Dilation: 5 Effacement (%): 50 Station: -2  Extremities: Edema: None  Fetal Status: Fetal Heart Rate (bpm): 156 Fundal Height: 39 cm Movement: Present Presentation: Vertex  Results for orders placed or performed in visit on 01/20/18 (from the past 24 hour(s))  POCT urinalysis dipstick   Collection Time: 01/20/18  8:55 AM  Result Value Ref Range   Color, UA     Clarity, UA     Glucose, UA Negative Negative   Bilirubin, UA     Ketones, UA neg    Spec Grav, UA  1.010 - 1.025   Blood, UA neg    pH, UA  5.0 - 8.0   Protein, UA Negative Negative   Urobilinogen, UA  0.2 or 1.0 E.U./dL   Nitrite, UA neg    Leukocytes, UA Negative Negative   Appearance     Odor       Orders Placed This Encounter  Procedures  . US FETAL BPP W/NONSTRESS  . POCT urinalysis dipstick    Plan:  Continued routine obstetrical care, membranes stripped  Return in about 1 week (around 01/27/2018) for LROB, NST.

## 2018-01-21 ENCOUNTER — Inpatient Hospital Stay (HOSPITAL_COMMUNITY)
Admission: AD | Admit: 2018-01-21 | Discharge: 2018-01-23 | DRG: 807 | Disposition: A | Discharge status: Home / Self Care | Payer: Medicaid Other | Attending: Obstetrics & Gynecology | Admitting: Obstetrics & Gynecology

## 2018-01-21 ENCOUNTER — Encounter (HOSPITAL_COMMUNITY): Payer: Self-pay

## 2018-01-21 ENCOUNTER — Inpatient Hospital Stay (HOSPITAL_COMMUNITY): Payer: Medicaid Other | Admitting: Anesthesiology

## 2018-01-21 DIAGNOSIS — O3403 Maternal care for unspecified congenital malformation of uterus, third trimester: Secondary | ICD-10-CM | POA: Diagnosis present

## 2018-01-21 DIAGNOSIS — Z3A39 39 weeks gestation of pregnancy: Secondary | ICD-10-CM

## 2018-01-21 DIAGNOSIS — Q513 Bicornate uterus: Secondary | ICD-10-CM | POA: Diagnosis not present

## 2018-01-21 DIAGNOSIS — Z3483 Encounter for supervision of other normal pregnancy, third trimester: Secondary | ICD-10-CM | POA: Diagnosis present

## 2018-01-21 LAB — CBC
HCT: 36 % (ref 36.0–46.0)
HEMOGLOBIN: 11.7 g/dL — AB (ref 12.0–15.0)
MCH: 28.5 pg (ref 26.0–34.0)
MCHC: 32.5 g/dL (ref 30.0–36.0)
MCV: 87.8 fL (ref 78.0–100.0)
Platelets: 253 10*3/uL (ref 150–400)
RBC: 4.1 MIL/uL (ref 3.87–5.11)
RDW: 15.7 % — AB (ref 11.5–15.5)
WBC: 14.3 10*3/uL — AB (ref 4.0–10.5)

## 2018-01-21 LAB — TYPE AND SCREEN
ABO/RH(D): O POS
ANTIBODY SCREEN: NEGATIVE

## 2018-01-21 MED ORDER — FENTANYL CITRATE (PF) 100 MCG/2ML IJ SOLN
100.0000 ug | INTRAMUSCULAR | Status: DC | PRN
  Administered 2018-01-21: 100 ug via INTRAVENOUS
  Filled 2018-01-21: qty 2

## 2018-01-21 MED ORDER — PRENATAL MULTIVITAMIN CH
1.0000 | ORAL_TABLET | Freq: Every day | ORAL | Status: DC
  Administered 2018-01-22: 1 via ORAL
  Filled 2018-01-21 (×3): qty 1

## 2018-01-21 MED ORDER — DIBUCAINE 1 % RE OINT: 1.0000 "application " | TOPICAL_OINTMENT | RECTAL | Status: DC | PRN

## 2018-01-21 MED ORDER — EPHEDRINE 5 MG/ML INJ: 10.0000 mg | INTRAVENOUS | Status: DC | PRN

## 2018-01-21 MED ORDER — LACTATED RINGERS IV SOLN: 500.0000 mL | Freq: Once | INTRAVENOUS | Status: DC

## 2018-01-21 MED ORDER — ONDANSETRON HCL 4 MG/2ML IJ SOLN: 4.0000 mg | INTRAMUSCULAR | Status: DC | PRN

## 2018-01-21 MED ORDER — ZOLPIDEM TARTRATE 5 MG PO TABS: 5.0000 mg | ORAL_TABLET | Freq: Every evening | ORAL | Status: DC | PRN

## 2018-01-21 MED ORDER — LIDOCAINE HCL (PF) 1 % IJ SOLN
INTRAMUSCULAR | Status: DC | PRN
  Administered 2018-01-21: 8 mL via EPIDURAL
  Administered 2018-01-21: 2 mL via EPIDURAL

## 2018-01-21 MED ORDER — PHENYLEPHRINE 40 MCG/ML (10ML) SYRINGE FOR IV PUSH (FOR BLOOD PRESSURE SUPPORT): 80.0000 ug | PREFILLED_SYRINGE | INTRAVENOUS | Status: DC | PRN

## 2018-01-21 MED ORDER — ACETAMINOPHEN 325 MG PO TABS: 650.0000 mg | ORAL_TABLET | ORAL | Status: DC | PRN

## 2018-01-21 MED ORDER — ONDANSETRON HCL 4 MG PO TABS: 4.0000 mg | ORAL_TABLET | ORAL | Status: DC | PRN

## 2018-01-21 MED ORDER — ONDANSETRON HCL 4 MG/2ML IJ SOLN: 4.0000 mg | Freq: Four times a day (QID) | INTRAMUSCULAR | Status: DC | PRN

## 2018-01-21 MED ORDER — OXYCODONE-ACETAMINOPHEN 5-325 MG PO TABS: 2.0000 | ORAL_TABLET | ORAL | Status: DC | PRN

## 2018-01-21 MED ORDER — OXYTOCIN BOLUS FROM INFUSION
500.0000 mL | Freq: Once | INTRAVENOUS | Status: AC
  Administered 2018-01-21: 500 mL via INTRAVENOUS

## 2018-01-21 MED ORDER — COCONUT OIL OIL: 1.0000 "application " | TOPICAL_OIL | Status: DC | PRN

## 2018-01-21 MED ORDER — IBUPROFEN 600 MG PO TABS
600.0000 mg | ORAL_TABLET | Freq: Four times a day (QID) | ORAL | Status: DC
  Administered 2018-01-21 – 2018-01-23 (×6): 600 mg via ORAL
  Filled 2018-01-21 (×8): qty 1

## 2018-01-21 MED ORDER — SOD CITRATE-CITRIC ACID 500-334 MG/5ML PO SOLN: 30.0000 mL | ORAL | Status: DC | PRN

## 2018-01-21 MED ORDER — SIMETHICONE 80 MG PO CHEW: 80.0000 mg | CHEWABLE_TABLET | ORAL | Status: DC | PRN

## 2018-01-21 MED ORDER — LACTATED RINGERS IV SOLN
INTRAVENOUS | Status: DC
  Administered 2018-01-21: 17:00:00 via INTRAVENOUS

## 2018-01-21 MED ORDER — WITCH HAZEL-GLYCERIN EX PADS: 1.0000 "application " | MEDICATED_PAD | CUTANEOUS | Status: DC | PRN

## 2018-01-21 MED ORDER — TETANUS-DIPHTH-ACELL PERTUSSIS 5-2.5-18.5 LF-MCG/0.5 IM SUSP: 0.5000 mL | Freq: Once | INTRAMUSCULAR | Status: DC

## 2018-01-21 MED ORDER — BENZOCAINE-MENTHOL 20-0.5 % EX AERO: 1.0000 "application " | INHALATION_SPRAY | CUTANEOUS | Status: DC | PRN

## 2018-01-21 MED ORDER — SENNOSIDES-DOCUSATE SODIUM 8.6-50 MG PO TABS
2.0000 | ORAL_TABLET | ORAL | Status: DC
  Administered 2018-01-21 – 2018-01-22 (×2): 2 via ORAL
  Filled 2018-01-21 (×2): qty 2

## 2018-01-21 MED ORDER — OXYTOCIN 40 UNITS IN LACTATED RINGERS INFUSION - SIMPLE MED
2.5000 [IU]/h | INTRAVENOUS | Status: DC
  Filled 2018-01-21: qty 1000

## 2018-01-21 MED ORDER — OXYCODONE-ACETAMINOPHEN 5-325 MG PO TABS: 1.0000 | ORAL_TABLET | ORAL | Status: DC | PRN

## 2018-01-21 MED ORDER — DIPHENHYDRAMINE HCL 25 MG PO CAPS: 25.0000 mg | ORAL_CAPSULE | Freq: Four times a day (QID) | ORAL | Status: DC | PRN

## 2018-01-21 MED ORDER — LIDOCAINE HCL (PF) 1 % IJ SOLN
30.0000 mL | INTRAMUSCULAR | Status: DC | PRN
  Filled 2018-01-21: qty 30

## 2018-01-21 MED ORDER — LACTATED RINGERS IV SOLN: 500.0000 mL | INTRAVENOUS | Status: DC | PRN

## 2018-01-21 MED ORDER — DIPHENHYDRAMINE HCL 50 MG/ML IJ SOLN: 12.5000 mg | INTRAMUSCULAR | Status: DC | PRN

## 2018-01-21 MED ORDER — FENTANYL 2.5 MCG/ML BUPIVACAINE 1/10 % EPIDURAL INFUSION (WH - ANES)
14.0000 mL/h | INTRAMUSCULAR | Status: DC | PRN
  Administered 2018-01-21: 14 mL/h via EPIDURAL

## 2018-01-21 NOTE — H&P (Addendum)
hOBSTETRIC ADMISSION HISTORY AND PHYSICAL  Samantha Khan is a 27 y.o. female G2P1001 with IUP at 58w5dpresenting for SOL with bloody show. She reports +FMs. No LOF, VB, blurry vision, headaches, trace edema, or RUQ pain. She plans on breast and bottle feeding. She requests Nexplanon for birth control.  Dating: By 11wk [place]--->  Estimated Date of Delivery: 01/23/18  Sono:    _0 , CWD, normal anatomy, cephalic presentation, 342%AJG EFW 237g   Prenatal History/Complications: Bicornate Uterus   Past Medical History: Past Medical History:  Diagnosis Date  . Abnormal Pap smear of cervix   . Anal fissure 02/2013  . Medical history non-contributory   . Pregnancy   . Supervision of normal pregnancy 07/21/2017    [place] Initiated care at 13wk Pap  04/17/2016 negative RCHD  Dating by 1st trimester U/S 11wk GC/CT Initial:             -/-       36wks:     -/- Support Person [place]:    neg Flu vaccine   CF: 07/20/12 neg        SMA:                Sickle Cell:07/20/12 neg Tdap vaccine Recommended ~28wks  5/29 Blood type  O+               Rhogam:    Antibody Negativ    Past Surgical History: Past Surgical History:  Procedure Laterality Date  . COLPOSCOPY  03/2011  . NO PAST SURGERIES      Obstetrical History: OB History    Gravida  2   Para  1   Term  1   Preterm      AB      Living  1     SAB      TAB      Ectopic      Multiple      Live Births  1           Social History: Social History   Socioeconomic History  . Marital status: Single    Spouse name: Not on file  . Number of children: Not on file  . Years of education: Not on file  . Highest education level: Not on file  Occupational History  . Not on file  Social Needs  . Financial resource strain: Not on file  . Food insecurity:    Worry: Not on file    Inability: Not on file  . Transportation needs:    Medical: Not on file    Non-medical: Not on  file  Tobacco Use  . Smoking status: Never Smoker  . Smokeless tobacco: Never Used  Substance and Sexual Activity  . Alcohol use: No  . Drug use: No  . Sexual activity: Yes    Birth control/protection: None  Lifestyle  . Physical activity:    Days per week: Not on file    Minutes per session: Not on file  . Stress: Not on file  Relationships  . Social connections:    Talks on phone: Not on file    Gets together: Not on file    Attends religious service: Not on file    Active member of club or organization: Not on file    Attends meetings of clubs or organizations: Not on file    Relationship status: Not on file  Other Topics Concern  . Not on file  Social History Narrative  . Not on file    Family History: Family History  Problem Relation Age of Onset  . Hypertension Maternal Grandmother   . Diabetes Maternal Grandmother   . Diabetes Paternal Grandmother   . Diabetes Paternal Grandfather   . Diabetes Maternal Grandfather     Allergies: No Known Allergies  Medications Prior to Admission  Medication Sig Dispense Refill Last Dose  . acetaminophen (TYLENOL) 325 MG tablet Take 650 mg by mouth every 6 (six) hours as needed for mild pain or headache.   prn  . Prenatal Multivit-Min-Fe-FA (PRENATAL VITAMINS) 0.8 MG tablet Take 1 tablet by mouth daily. 30 tablet 12 Past Week at Unknown time     Review of Systems   All systems reviewed and negative except as stated in HPI  Blood pressure (!) 103/56, pulse 85, temperature 97.8 F (36.6 C), temperature source Oral, resp. rate 18, height _0  (1.499 m), weight 82.1 kg (181 lb). General appearance: alert and cooperative Lungs: regular rate and effort Heart: regular rate  Abdomen: soft, non-tender Extremities: Homans sign is negative, no sign of DVT Presentation: cephalic Fetal monitoringBaseline: 150s bpm, Variability: Good {> 6 bpm), Accelerations: Reactive and Decelerations: Early Uterine activity, Frequency: Every 4  minutes, Duration: 45 seconds and Intensity: strong Dilation: 6 Effacement (%): 90 Station: -2, -3 Exam by:: B. Bowen, RN   Prenatal labs: ABO, Rh: --/--/O POS (07/18 1718) Antibody: PENDING (07/18 1718) Rubella: 16.60 (01/15 1450) RPR: Non Reactive (04/10 0913)  HBsAg: Negative (01/15 1450)  HIV: Non Reactive (04/10 0913)  GBS:   neg 2 hr GTT neg  Prenatal Transfer Tool  Maternal Diabetes: No Genetic Screening: Normal Maternal Ultrasounds/Referrals: Normal Fetal Ultrasounds or other Referrals:  None Maternal Substance Abuse:  No Significant Maternal Medications:  None Significant Maternal Lab Results: None  Results for orders placed or performed during the hospital encounter of 01/21/18 (from the past 24 hour(s))  CBC   Collection Time: 01/21/18  5:18 PM  Result Value Ref Range   WBC 14.3 (H) 4.0 - 10.5 K/uL   RBC 4.10 3.87 - 5.11 MIL/uL   Hemoglobin 11.7 (L) 12.0 - 15.0 g/dL   HCT 36.0 36.0 - 46.0 %   MCV 87.8 78.0 - 100.0 fL   MCH 28.5 26.0 - 34.0 pg   MCHC 32.5 30.0 - 36.0 g/dL   RDW 15.7 (H) 11.5 - 15.5 %   Platelets 253 150 - 400 K/uL  Type and screen [place]   Collection Time: 01/21/18  5:18 PM  Result Value Ref Range   ABO/RH(D) O POS    Antibody Screen PENDING    Sample Expiration      01/24/2018 Performed at [facility], [address] [place], [place] [address]     Patient Active Problem List   Diagnosis Date Noted  . Indication for care in labor or delivery 01/21/2018  . Bicornate uterus 08/19/2017    Assessment: Samantha Khan is a 27 y.o. G2P1001 at 23w5dhere for SOL with bloody show   1. Labor: early active  2. FWB: cat 1 3. Pain: strong  4. GBS: neg   Plan: Expectant management. Epidural.   AJustus Memory MD  01/21/2018, 6:12 PM   I have spoken with and examined this patient and agree with resident/PA-S/Med-S/SNM's note and plan of care. VSS, HRR&R, Resp unlabored,  AROM at 1912 w/clear fluid. Cx  C/C/0.  Will labor down FNigel Berthold CNM 01/21/2018 9:35 PM

## 2018-01-21 NOTE — MAU Note (Signed)
Contractions started last night, getting closer and stronger.  Was 4+ when last checked.  Some bloody mucous.

## 2018-01-21 NOTE — Anesthesia Procedure Notes (Signed)
Epidural Patient location during procedure: OB Start time: 01/21/2018 6:30 PM End time: 01/21/2018 6:37 PM  Staffing Anesthesiologist: Leilani AbleHatchett, Pailyn [place], MD  Preanesthetic Checklist Completed: patient identified, site marked, surgical consent, pre-op evaluation, timeout performed, IV checked, risks and benefits discussed and monitors and equipment checked  Epidural Patient position: sitting Prep: site prepped and draped and DuraPrep Patient monitoring: continuous pulse ox and blood pressure Approach: midline Location: L3-L4 Injection technique: LOR air  Needle:  Needle type: Tuohy  Needle gauge: 17 G Needle length: 9 cm and 9 Needle insertion depth: 5 cm cm Catheter type: closed end flexible Catheter size: 19 Gauge Catheter at skin depth: 10 cm Test dose: negative  Assessment Sensory level: T10 Events: blood not aspirated, injection not painful, no injection resistance, negative IV test and no paresthesia  Additional Notes Reason for block:procedure for pain

## 2018-01-21 NOTE — Anesthesia Preprocedure Evaluation (Signed)
Anesthesia Evaluation  Patient identified by MRN, date of birth, ID band Patient awake    Reviewed: Allergy & Precautions, H&P , NPO status , Patient's Chart, lab work & pertinent test results  History of Anesthesia Complications Negative for: history of anesthetic complications  Airway Mallampati: I  TM Distance: >3 FB Neck ROM: full    Dental no notable dental hx. (+) Teeth Intact   Pulmonary neg pulmonary ROS,    Pulmonary exam normal breath sounds clear to auscultation       Cardiovascular negative cardio ROS Normal cardiovascular exam Rhythm:regular Rate:Normal     Neuro/Psych negative neurological ROS  negative psych ROS   GI/Hepatic negative GI ROS, Neg liver ROS,   Endo/Other  negative endocrine ROS  Renal/GU negative Renal ROS  negative genitourinary   Musculoskeletal negative musculoskeletal ROS (+)   Abdominal (+) + obese,   Peds negative pediatric ROS (+)  Hematology negative hematology ROS (+)   Anesthesia Other Findings   Reproductive/Obstetrics (+) Pregnancy                             Anesthesia Physical  Anesthesia Plan  ASA: II  Anesthesia Plan: Epidural   Post-op Pain Management:    Induction:   PONV Risk Score and Plan:   Airway Management Planned:   Additional Equipment:   Intra-op Plan:   Post-operative Plan:   Informed Consent: I have reviewed the patients History and Physical, chart, labs and discussed the procedure including the risks, benefits and alternatives for the proposed anesthesia with the patient or authorized representative who has indicated his/her understanding and acceptance.     Plan Discussed with:   Anesthesia Plan Comments:         Anesthesia Quick Evaluation

## 2018-01-21 NOTE — Progress Notes (Signed)
Labor Progress Note Rayetta PiggKarla Boomershine is a 27 y.o. G2P1001 at 6128w5d presented for SOL   S:  Has gotten epidural and is a lot more comfortable is having no nausea or vomiting, no headache or RUQ pain and no edema. She is tolerating well.   O:  BP 102/66   Pulse 96   Temp 98.3 F (36.8 C) (Oral)   Resp 16   Ht 4\' 11"  (1.499 m)   Wt 82.1 kg (181 lb)   LMP  (LMP Unknown)   BMI 36.56 kg/m  EFM: baseline 140s bpm/ good variability/ positive accels/ early decels with scattered late  Toco: 1 every 3 mins SVE: Dilation: 10 Dilation Complete Date: 01/21/18 Dilation Complete Time: 1916 Effacement (%): 100 Cervical Position: Middle Station: -2, -3 Presentation: Vertex Exam by:: fran,CNM Pitocin: none mu/min  A/P: 27 y.o. G2P1001 6128w5d  1. Labor: Second state  2. FWB: cat 2 3. Pain: 5/10  Delivery and expectant management   Sandi RavelingAnson B Mieka Leaton, MD 7:51 PM

## 2018-01-22 LAB — RPR: RPR: NONREACTIVE

## 2018-01-22 NOTE — Progress Notes (Addendum)
Patient ID: Samantha PiggKarla Valcarcel, female   DOB: 08/18/1990, 27 y.o.   MRN: [id number]   POSTPARTUM PROGRESS NOTE  Post Partum Day 1 Subjective:  Samantha Khan is a 27 y.o. G2P1001 7853w6d s/p SOL and SVD at 2030.  No acute events overnight.  Pt denies problems with ambulating, voiding or po intake.  She denies nausea or vomiting. She denies any headache, lightheadedness, or dizziness. She endorses some lower left back pain she rates as a 4/10. Pain is well controlled otherwise. Lochia Moderate and described as that of a normal period.   Objective: Blood pressure 90/63, pulse 74, temperature 98.3 F (36.8 C), temperature source Oral, resp. rate 16, height 4\' 11"  (1.499 m), weight 82.1 kg (181 lb), SpO2 95 %.  Physical Exam:  General: alert, cooperative and no distress Lochia: moderate flow Chest: no respiratory distress Heart:regular rate, distal pulses intact Abdomen: soft, nontender to palpation Uterine Fundus: firm, appropriately tender DVT Evaluation: No calf swelling or tenderness Extremities: no edema  Recent Labs    01/21/18 1718  HGB 11.7*  HCT 36.0    Vitals:   01/21/18 2200 01/21/18 2245 01/21/18 2345 01/22/18 0415  BP: 96/60 106/63 102/67 90/63  Pulse: (!) 108 78 91 74  Resp: 16 18 16 16   Temp:  98.3 F (36.8 C) 98.3 F (36.8 C) 98.3 F (36.8 C)  TempSrc:  Oral Oral Oral  SpO2:  98% 99% 95%  Weight:      Height:        Assessment/Plan:  ASSESSMENT: Samantha Khan is a 27 y.o. G2P1001 7553w6d s/p SOL and SVD.  Feeding: Both breast and bottle Contraception: Nexplanon Plan for Discharge tomorrow   LOS: 1 day   Candie MileLindsey R Mitchell 01/22/2018, 6:26 AM   OB FELLOW MEDICAL STUDENT NOTE ATTESTATION  I confirm that I have verified the information documented in the medical student's note and that I have also personally performed the physical exam and all medical decision making activities.   Frederik PearJulie P Degele, MD OB Fellow 01/23/2018

## 2018-01-22 NOTE — Lactation Note (Signed)
This note was copied from a baby's chart. Lactation Consultation Note  Patient Name: Samantha Rayetta PiggKarla Calvert ZOXWR'UToday's Date: 01/22/2018 Reason for consult: Initial assessment;1st time breastfeeding;Term  RN request to speak with mother regarding breastfeeding   P2 mother whose infant is now 1073 hours old.  Mother did not breastfeed her first child who is 747 years old.  After spending time discussing breast vs bottle, mother stated her feeding plan was to breast and bottle feed.  She does not want to exclusively breastfeed.  Upon assessment, I noted mother's breasts to be soft and non tender with short shafted nipples bilaterally.  Baby has recently had a bottle and took 20 mls and is currently sleeping in bassinet.    Encouraged mother to feed 8-12 times/24 hours or more if she shows feeding cues.  Reviewed feeding cues with mother.  Provided breast shells and manual pump with instructions for use.  Mother asking about using a NS.  At this time I do not see a need for a NS.  Instead, I suggested she call for latch assistance from RN/LC the next time baby is ready to feed.  With a little manual pumping mother's nipples became slightly more everted.  I explained that I would like to try latching baby without a NS.  Mother verbalized understanding.    Explained about the volume she fed infant and stated that baby may not show feeding cues for quite awhile and that 20 mls is too much to feed if she wants to breast/bottle feed.  Mom made aware of O/P services, breastfeeding support groups, community resources, and our phone # for post-discharge questions. Father and grandmother present.  RN updated.     Maternal Data Formula Feeding for Exclusion: No Has patient been taught Hand Expression?: Yes Does the patient have breastfeeding experience prior to this delivery?: No  Feeding Feeding Type: Bottle Fed - Formula Nipple Type: Slow - flow Length of feed: 10 min  LATCH Score Latch: Repeated attempts  needed to sustain latch, nipple held in mouth throughout feeding, stimulation needed to elicit sucking reflex.  Audible Swallowing: None  Type of Nipple: Flat  Comfort (Breast/Nipple): Soft / non-tender  Hold (Positioning): No assistance needed to correctly position infant at breast.  LATCH Score: 6  Interventions    Lactation Tools Discussed/Used Pump Review: Setup, frequency, and cleaning Initiated by:: Maison Kestenbaum Date initiated:: 01/22/18   Consult Status Consult Status: Follow-up Date: 01/23/18 Follow-up type: In-patient    Oswin Griffith R Miaisabella Bacorn 01/22/2018, 12:21 AM

## 2018-01-22 NOTE — Lactation Note (Addendum)
This note was copied from a baby's chart. Lactation Consultation Note  Patient Name: Samantha Rayetta PiggKarla Fiallos ZOXWR'UToday's Date: 01/22/2018 Reason for consult: Follow-up assessment;1st time breastfeeding;Term  5316 hours old female who is now being exclusively formula fed by her mother. RN called for assistance, she was under the impression that mom wanted LC to come help her with the latch. Baby was choking when entering the room, assisted with bulb syringe and showed parents how to extract excess fluid out of baby's mouth.  Mom stated she fed baby 20 ml (22 ml per Flowsheet) of formula to baby, she had a large emesis. LC noted curded formula and mucus combined. Explained to mother (again) what previous LC had said during consult that 20 ml is too much volume for baby's age and that she should follow the formula chart according the baby's age. Mom voiced understanding.  Asked mom to call for latch assistance the next time she wanted to put baby to the breast but she stated that she's just going to formula fed. Updated RN about mom's new feeding choice. Mom is aware of LC OP services in case she changes her mind or needs assistance with engorgement; engorgement prevention and treatment was also discussed.  Maternal Data    Feeding Feeding Type: Bottle Fed - Formula Nipple Type: Slow - flow  Interventions Interventions: Breast feeding basics reviewed  Lactation Tools Discussed/Used     Consult Status Consult Status: Complete Date: 01/23/18 Follow-up type: Call as needed    Kathyann Spaugh Venetia ConstableS Krystal Delduca 01/22/2018, 12:28 PM

## 2018-01-22 NOTE — Anesthesia Postprocedure Evaluation (Signed)
Anesthesia Post Note  Patient: Samantha Khan  Procedure(s) Performed: AN AD HOC LABOR EPIDURAL     Patient location during evaluation: Mother Baby Anesthesia Type: Epidural Level of consciousness: awake Pain management: pain level controlled Vital Signs Assessment: post-procedure vital signs reviewed and stable Respiratory status: spontaneous breathing Cardiovascular status: stable Postop Assessment: no headache, no backache, epidural receding, patient able to bend at knees, no apparent nausea or vomiting, adequate PO intake and able to ambulate Anesthetic complications: no    Last Vitals:  Vitals:   01/21/18 2345 01/22/18 0415  BP: 102/67 90/63  Pulse: 91 74  Resp: 16 16  Temp: 36.8 C 36.8 C  SpO2: 99% 95%    Last Pain:  Vitals:   01/22/18 0554  TempSrc:   PainSc: 0-No pain   Pain Goal: Patients Stated Pain Goal: 0 (01/22/18 0415)               Fanny DanceMULLINS,Robi Mitter

## 2018-01-23 ENCOUNTER — Encounter (HOSPITAL_COMMUNITY): Payer: Self-pay | Admitting: *Deleted

## 2018-01-23 MED ORDER — IBUPROFEN 600 MG PO TABS: 600.0000 mg | ORAL_TABLET | Freq: Four times a day (QID) | ORAL | 0 refills | Status: DC | PRN

## 2018-01-23 NOTE — Discharge Summary (Signed)
OB Discharge Summary     Patient Name: Samantha Khan DOB: 12-Jul-1990 MRN: [id number]  Date of admission: 01/21/2018 Delivering MD: Jacklyn Shell   Date of discharge: 01/23/2018  Admitting diagnosis: 39WKS CTX Intrauterine pregnancy: [redacted]w[redacted]d     Secondary diagnosis:  Active Problems:   Bicornate uterus   Indication for care in labor or delivery   SVD (spontaneous vaginal delivery)  Additional problems: none     Discharge diagnosis: Term Pregnancy Delivered                                                                                                Post partum procedures:none  Augmentation: AROM  Complications: None  Hospital course:  Onset of Labor With Vaginal Delivery     27 y.o. yo G2P1001 at [redacted]w[redacted]d was admitted in Active Labor on 01/21/2018. Patient had an uncomplicated labor course as follows:  Membrane Rupture Time/Date: 7:12 PM ,01/21/2018   Intrapartum Procedures: Episiotomy: None [1]                                         Lacerations:  None [1]  Patient had a delivery of a Viable infant. 01/21/2018  Information for the patient's newborn:  Anniemae, Haberkorn [[id number]]  Delivery Method: Vaginal, Spontaneous(Filed from Delivery Summary)    Pateint had an uncomplicated postpartum course.  She is ambulating, tolerating a regular diet, passing flatus, and urinating well. Patient is discharged home in stable condition on 01/23/18.   Physical exam  Vitals:   01/22/18 0900 01/22/18 1107 01/22/18 1511 01/22/18 2148  BP: 96/61 (!) 91/59 94/62 97/61   Pulse: 82 80 80 89  Resp: 18 18 16 18   Temp: 97.7 F (36.5 C) 98.2 F (36.8 C) 98.4 F (36.9 C) 98.1 F (36.7 C)  TempSrc: Oral Oral Oral Oral  SpO2: 99% 99% 100%   Weight:      Height:       General: alert and cooperative Lochia: appropriate Uterine Fundus: firm Incision: N/A DVT Evaluation: No evidence of DVT seen on physical exam. Labs: Lab Results  Component Value Date   WBC 14.3 (H)  01/21/2018   HGB 11.7 (L) 01/21/2018   HCT 36.0 01/21/2018   MCV 87.8 01/21/2018   PLT 253 01/21/2018   CMP Latest Ref Rng & Units 07/04/2012  Glucose 70 - 99 mg/dL 89  BUN 6 - 23 mg/dL 8  Creatinine 7.82 - 9.56 mg/dL 2.13  Sodium 086 - 578 mEq/L 133(L)  Potassium 3.5 - 5.1 mEq/L 3.2(L)  Chloride 96 - 112 mEq/L 100  CO2 19 - 32 mEq/L 24  Calcium 8.4 - 10.5 mg/dL 8.9    Discharge instruction: per After Visit Summary and "Baby and Me Booklet".  After visit meds:  Allergies as of 01/23/2018   No Known Allergies     Medication List    STOP taking these medications   acetaminophen 325 MG tablet Commonly known as:  TYLENOL     TAKE these medications   ibuprofen  600 MG tablet Commonly known as:  ADVIL,MOTRIN Take 1 tablet (600 mg total) by mouth every 6 (six) hours as needed.   Prenatal Vitamins 0.8 MG tablet Take 1 tablet by mouth daily.       Diet: routine diet  Activity: Advance as tolerated. Pelvic rest for 6 weeks.   Outpatient follow up:4 weeks Follow up Appt: Future Appointments  Date Time Provider Department Center  03/03/2018 11:00 AM Cresenzo-Dishmon, Scarlette CalicoFrances, CNM [facility]   Follow up Visit:No follow-ups on file.  Postpartum contraception: Nexplanon  Newborn Data: Live born female  Birth Weight: 8 lb 2.5 oz (3700 g) APGAR: 9, 9  Newborn Delivery   Birth date/time:  01/21/2018 20:24:00 Delivery type:  Vaginal, Spontaneous     Baby Feeding: Bottle and Breast Disposition:home with mother   01/23/2018 Cam HaiSHAW, KIMBERLY, CNM  9:20 AM

## 2018-01-23 NOTE — Discharge Instructions (Signed)
Parto vaginal, cuidados posteriores  Vaginal Delivery, Care After  Siga estas instrucciones durante las prximas semanas. Estas indicaciones le proporcionan informacin acerca de cmo deber cuidarse despus del parto vaginal. Su mdico tambin podr darle indicaciones ms especficas. El tratamiento ha sido planificado segn las prcticas mdicas actuales, pero en algunos casos pueden ocurrir problemas. Llame al mdico si tiene problemas o preguntas.  Qu puedo esperar despus del procedimiento?  Despus de un parto vaginal, es frecuente tener lo siguiente:   Hemorragia leve de la vagina.   Dolor en el abdomen, la vagina y la zona de la piel entre la abertura vaginal y el ano (perineo).   Calambres plvicos.   Fatiga.    Siga estas indicaciones en su casa:  Medicamentos   Tome los medicamentos de venta libre y los recetados solamente como se lo haya indicado el mdico.   Si le recetaron un antibitico, tmelo como se lo haya indicado el mdico. No interrumpa la administracin del antibitico hasta que lo haya terminado.  Conducir     No conduzca ni opere maquinaria pesada mientras toma analgsicos recetados.   No conduzca durante 24horas si le administraron un sedante.  Estilo de vida   No beba alcohol. Esto es de suma importancia si est amamantando o toma analgsicos.   No consuma productos que contengan tabaco, incluidos cigarrillos, tabaco de mascar o cigarrillos electrnicos. Si necesita ayuda para dejar de fumar, consulte al mdico.  Qu debe comer y beber   Beba al menos 8vasos de ochoonzas (240cc) de agua todos los das a menos que el mdico le indique lo contrario. Si elige amamantar al beb, quiz deba beber an ms cantidad de agua.   Coma alimentos ricos en fibras todos los das. Estos alimentos pueden ayudarla a prevenir o aliviar el estreimiento. Los alimentos ricos en fibras incluyen, entre otros:  ? Panes y cereales integrales.  ? Arroz integral.  ? Frijoles.  ? Frutas y verduras  frescas.  Actividad   Retome sus actividades normales como se lo haya indicado el mdico. Pregntele al mdico qu actividades son seguras para usted.   Descanse todo lo que pueda. Trate de descansar o tomar una siesta mientras el beb est durmiendo.   No levante objetos que pesen ms que su beb o 10libras (4,5kg) hasta que el mdico le diga que es seguro.   Hable con el mdico sobre cundo puede retomar la actividad sexual. Esto puede depender de lo siguiente:  ? Riesgo de sufrir una infeccin.  ? Velocidad de cicatrizacin.  ? Comodidad y deseo de retomar la actividad sexual.  Cuidados vaginales   Si le realizaron una episiotoma o tuvo un desgarro vaginal, contrlese la zona todos los das para detectar signos de infeccin. Est atenta a los siguientes signos:  ? Aumento del enrojecimiento, la hinchazn o el dolor.  ? Mayor presencia de lquido o sangre.  ? Calor.  ? Pus o mal olor.   No use tampones ni se haga duchas vaginales hasta que el mdico la autorice.   Controle la sangre que elimina por la vagina para detectar cogulos de sangre. Estos pueden tener el aspecto de grumos de color rojo oscuro, o secrecin marrn o negra.  Instrucciones generales   Mantenga el perineo limpio y seco, como se lo haya indicado el mdico.   Use ropa cmoda y suelta.   Cuando vaya al bao, siempre higiencese de adelante hacia atrs.   Pregntele al mdico si puede ducharse o tomar baos de inmersin.   Si se le realiz una episiotoma o tuvo un desgarro perineal durante el trabajo del parto o el parto, es posible que el mdico le indique que no tome baos de inmersin durante un determinado tiempo.   Use un sostn que sujete y ajuste bien sus pechos.   Si es posible, pdale a alguien que la ayude con las tareas del hogar y a cuidar del beb durante al menos algunos das despus de que le den el alta del hospital.   Concurra a todas las visitas de seguimiento para usted y el beb, como se lo haya indicado el  mdico. Esto es importante.  Comunquese con un mdico si:   Tiene los siguientes sntomas:  ? Secrecin vaginal que tiene mal olor.  ? Dificultad para orinar.  ? Dolor al orinar.  ? Aumento o disminucin repentinos de la frecuencia de las deposiciones.  ? Ms enrojecimiento, hinchazn o dolor alrededor de la episiotoma o del desgarro vaginal.  ? Ms secrecin de lquido o sangre de la episiotoma o del desgarro vaginal.  ? Pus o mal olor proveniente de la episiotoma o del desgarro vaginal.  ? Fiebre.  ? Erupcin cutnea.  ? Poco inters o falta de inters en actividades que solan gustarle.  ? Dudas sobre su cuidado y el del beb.   Siente la episiotoma o el desgarro vaginal caliente al tacto.   La episiotoma o el desgarro vaginal se abren o no parecen cicatrizar.   Siente dolor en las mamas, o estn duras o enrojecidas.   Siente tristeza o preocupacin de forma inusual.   Siente nuseas o vomita.   Elimina cogulos de sangre grandes por la vagina. Si expulsa un cogulo de sangre por la vagina, gurdelo para mostrrselo a su mdico. No tire la cadena sin que el mdico examine el cogulo de sangre antes.   Orina ms de lo habitual.   Se siente mareada o se desmaya.   No ha amamantado para nada y no ha tenido un perodo menstrual durante 12 semanas despus del parto.   Dej de amamantar al beb y no ha tenido su perodo menstrual durante 12 semanas despus de dejar de amamantar.  Solicite ayuda de inmediato si:   Tiene los siguientes sntomas:  ? Dolor que no desaparece o no mejora con medicamentos.  ? Dolor en el pecho.  ? Dificultad para respirar.  ? Visin borrosa o manchas en la vista.  ? Pensamientos de autolesionarse o lesionar al beb.   Comienza a sentir dolor en el abdomen o en una de las piernas.   Presenta un dolor de cabeza intenso.   Se desmaya.   Tiene una hemorragia de la vagina tan intensa que empapa dos toallitas sanitarias en una hora.  Esta informacin no tiene como fin  reemplazar el consejo del mdico. Asegrese de hacerle al mdico cualquier pregunta que tenga.  Document Released: 06/23/2005 Document Revised: 10/15/2016 Document Reviewed: 07/08/2015  Elsevier Interactive Patient Education  2018 [employer]

## 2018-01-27 ENCOUNTER — Other Ambulatory Visit: Payer: Self-pay | Admitting: Women's Health

## 2018-02-17 ENCOUNTER — Telehealth: Payer: Self-pay | Admitting: *Deleted

## 2018-02-17 NOTE — Telephone Encounter (Signed)
OTC hemorrhoid cream is fine

## 2018-02-17 NOTE — Telephone Encounter (Signed)
Lori from [facility] called stating she was doing a home visit with the patient and she was complaining about her hemorrhoids.  She was not given anything at discharge but Is having hard stools with pain.  Advised to try Witch hazel pads and add a stool softner daily.  Patient is asking for a "cream" for the hemorrhoids as well.  Please advise.

## 2018-03-03 ENCOUNTER — Ambulatory Visit: Payer: Self-pay | Admitting: Advanced Practice Midwife

## 2018-10-14 IMAGING — US US OB COMP LESS 14 WK
1 series · 15 of 28 positions shown · non-contrast
Comparison: Pelvic ultrasound dated July 05, 2012.

CLINICAL DATA: Cramping and vaginal bleeding for the past 2 days.

EXAM:
OBSTETRIC <14 WK ULTRASOUND
TECHNIQUE: Transabdominal ultrasound was performed for evaluation of the
gestation as well as the maternal uterus and adnexal regions.

[Series 1: us ob comp less 14 wk · 15 of 29 slices shown]
[im 1/29]
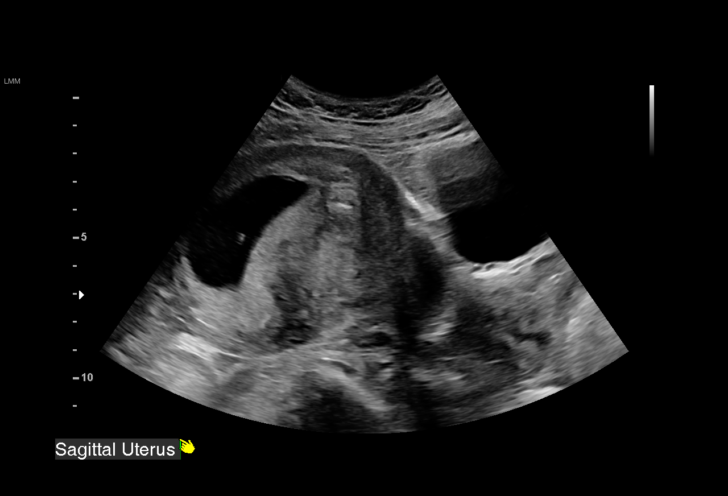
[im 3/29]
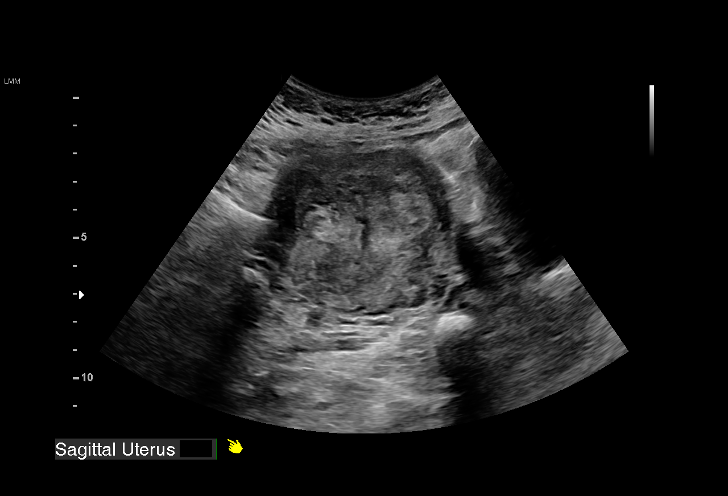
[im 5/29]
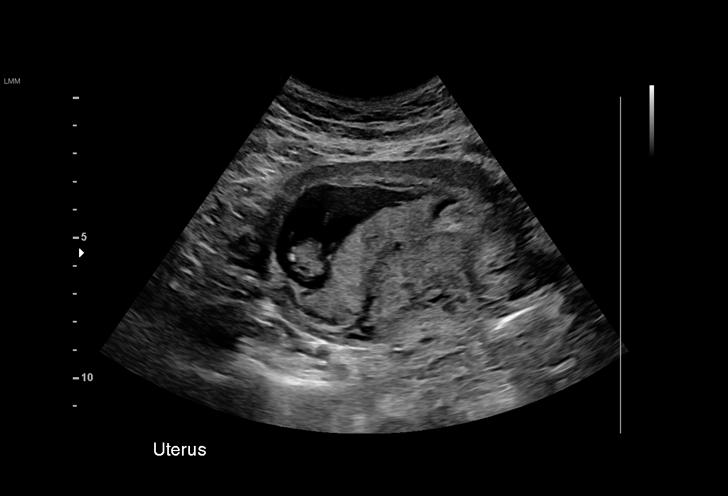
[im 7/29]
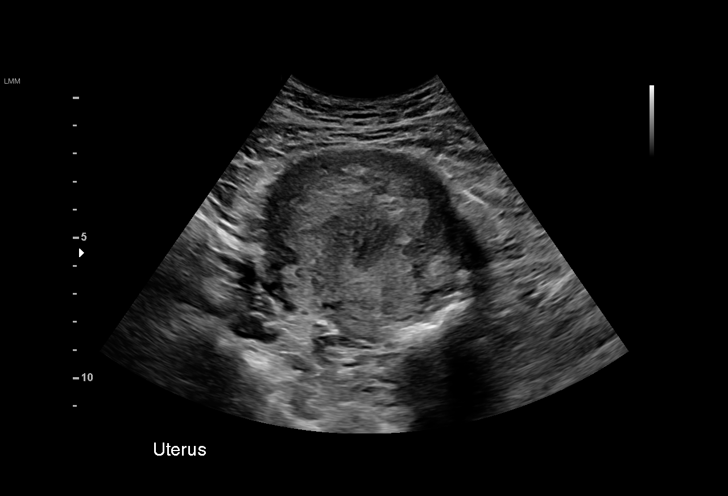
[im 9/29]
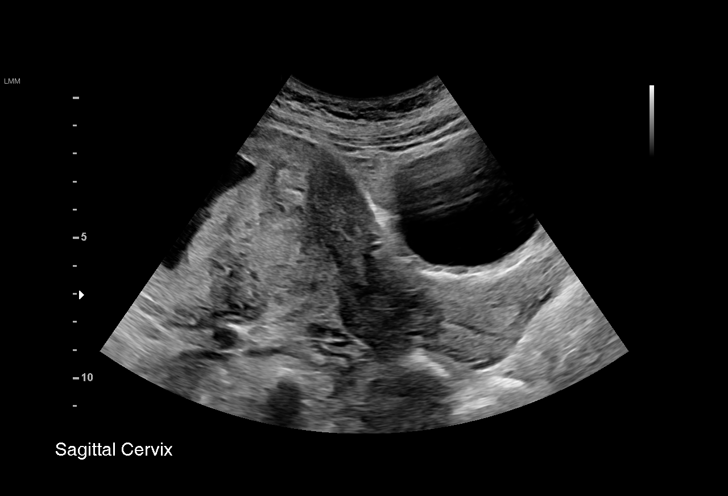
[im 11/29]
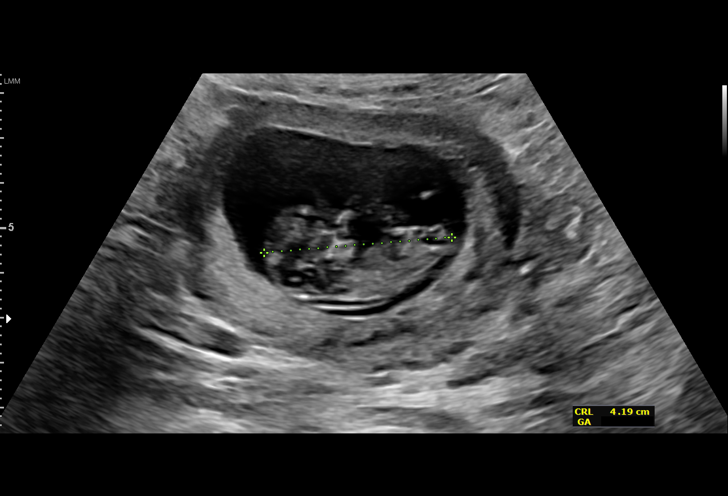
[im 13/29]
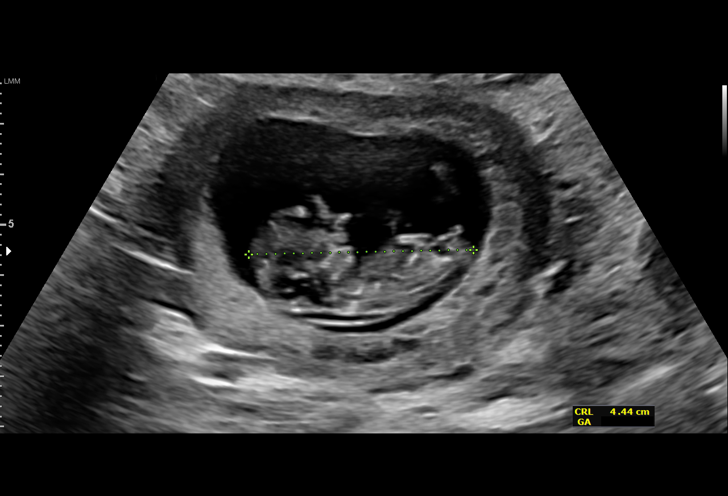
[im 15/29]
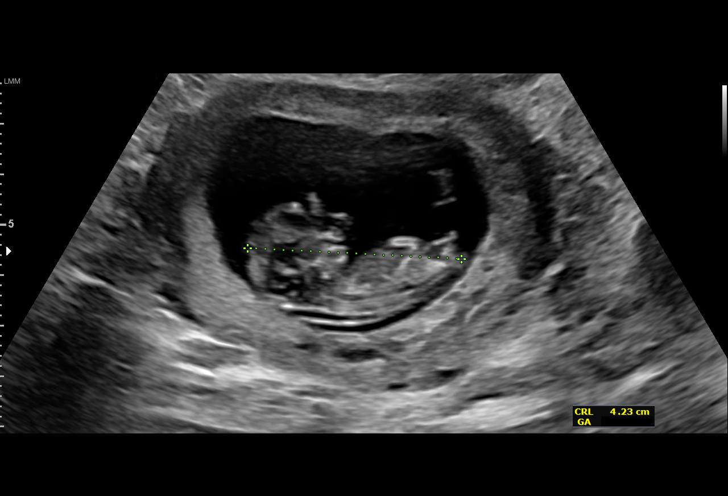
[im 16/29]
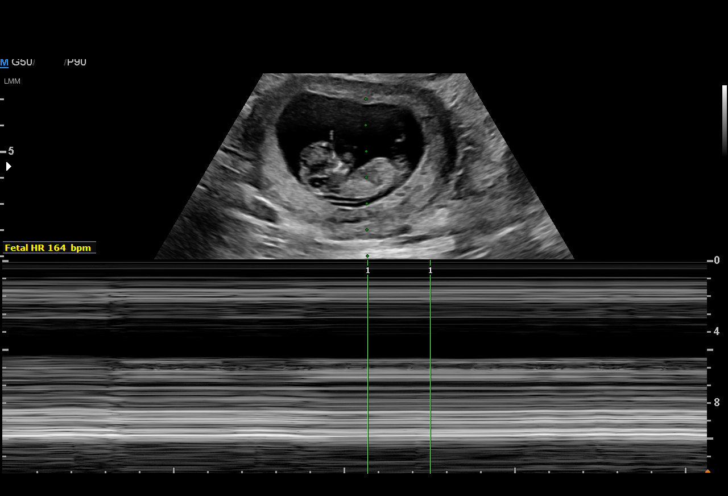
[im 18/29]
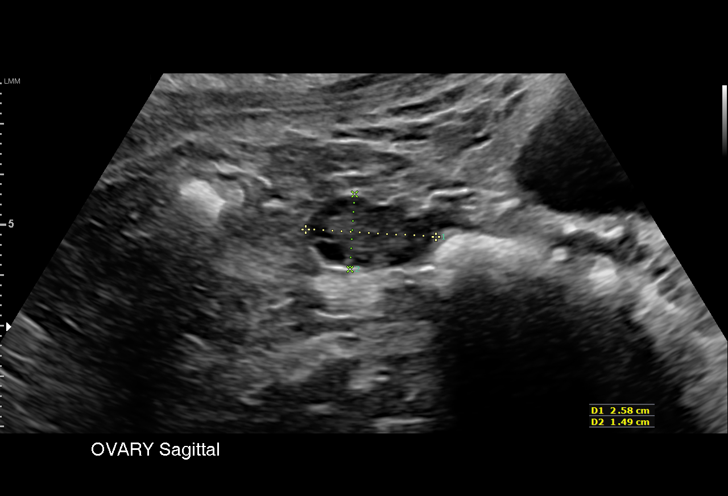
[im 20/29]
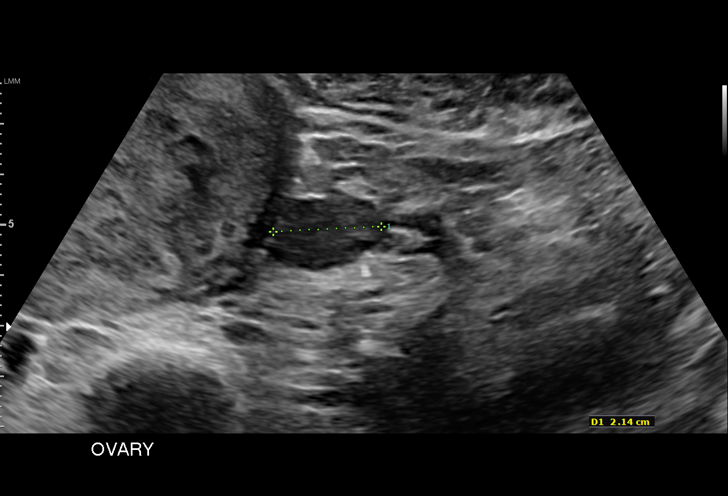
[im 22/29]
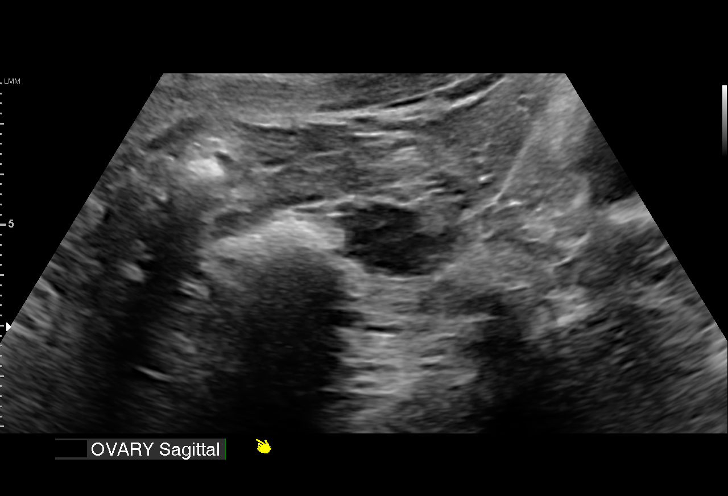
[im 24/29]
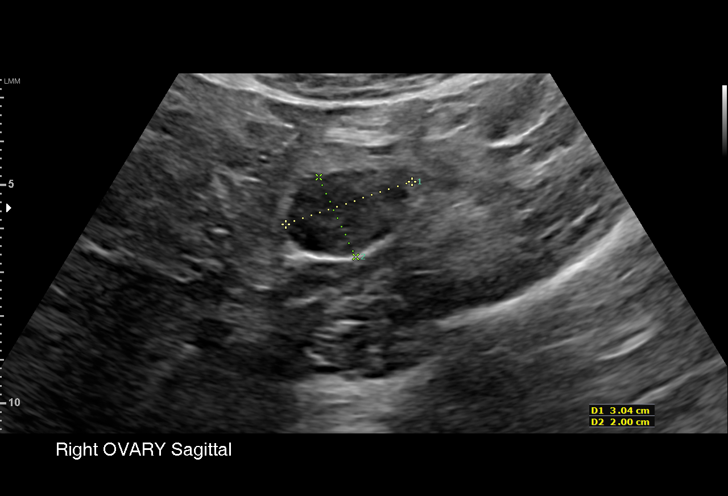
[im 26/29]
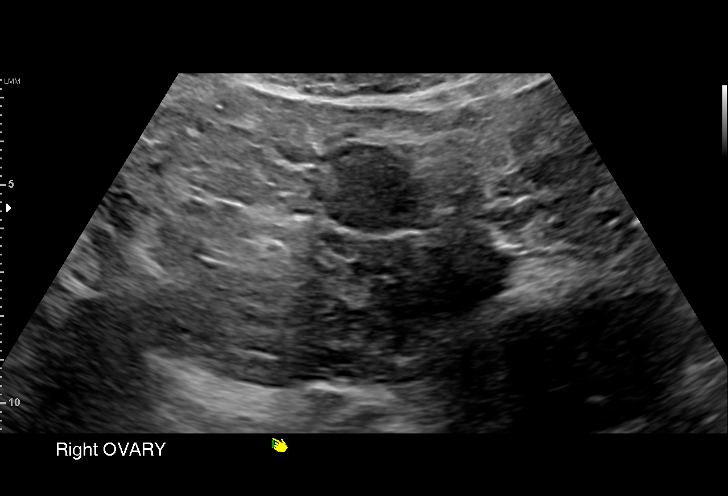
[im 29/29]
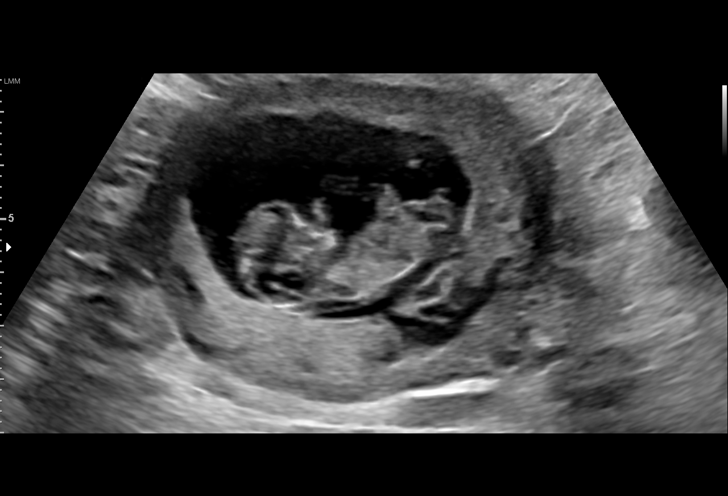

[15 of 28 positions shown; findings below may reference images not displayed]

FINDINGS: Intrauterine gestational sac: Single

Yolk sac:  Not Visualized.

Embryo:  Visualized.

Cardiac Activity: Visualized.

Heart Rate: 164 bpm

CRL:   4.3  cm   11 w 1 d                  US EDC: 01/23/2018

Subchorionic hemorrhage:  None visualized.

Maternal uterus/adnexae: Unremarkable.
IMPRESSION: 1. Single live intrauterine pregnancy with an estimated gestational
age of 11 weeks, 1 day. No acute abnormality visualized.

## 2019-03-24 ENCOUNTER — Telehealth: Payer: Self-pay

## 2019-03-24 NOTE — Telephone Encounter (Signed)
Faxed over pt. medassist application   -Samantha Khan

## 2019-04-07 ENCOUNTER — Ambulatory Visit: Payer: Self-pay | Admitting: Physician Assistant

## 2019-04-07 ENCOUNTER — Other Ambulatory Visit: Payer: Self-pay

## 2019-04-07 ENCOUNTER — Encounter: Payer: Self-pay | Admitting: Physician Assistant

## 2019-04-07 DIAGNOSIS — Z20822 Contact with and (suspected) exposure to covid-19: Secondary | ICD-10-CM

## 2019-04-07 DIAGNOSIS — Z7689 Persons encountering health services in other specified circumstances: Secondary | ICD-10-CM

## 2019-04-07 DIAGNOSIS — R059 Cough, unspecified: Secondary | ICD-10-CM

## 2019-04-07 DIAGNOSIS — R05 Cough: Secondary | ICD-10-CM

## 2019-04-07 NOTE — Progress Notes (Signed)
   There were no vitals taken for this visit.   Subjective:    Patient ID: Samantha Khan, female    DOB: 1991/04/14, 28 y.o.   MRN: [id number]  HPI: Samantha Khan is a 28 y.o. female presenting on 04/07/2019 for No chief complaint on file.   HPI    This is a telemedicine appointmet through [employer] due to coronavirus pandemic  I connected with  Samantha Khan on 04/07/19 by a video enabled telemedicine application and verified that I am speaking with the correct person using two identifiers.   I discussed the limitations of evaluation and management by telemedicine. The patient expressed understanding and agreed to proceed.     Pt is at home.  Provider is at office   Ms Samantha Khan is New pt/returning pt  Pt now has 2 children.    She is  Sick.  She has cough.  It Started Monday.  No fever.  No sob.  + ST.    Her boyfrined and her child both sick.      Relevant past medical, surgical, family and social history reviewed and updated as indicated. Interim medical history since our last visit reviewed. Allergies and medications reviewed and updated.  No current outpatient medications on file.    Review of Systems  Per HPI unless specifically indicated above     Objective:    There were no vitals taken for this visit.  Wt Readings from Last 3 Encounters:  01/21/18 181 lb (82.1 kg)  01/20/18 181 lb (82.1 kg)  01/13/18 178 lb (80.7 kg)    Physical Exam Constitutional:      General: She is not in acute distress.    Appearance: Normal appearance.  HENT:     Head: Normocephalic and atraumatic.  Pulmonary:     Effort: No respiratory distress.  Neurological:     Mental Status: She is alert and oriented to person, place, and time.  Psychiatric:        Attention and Perception: Attention normal.        Mood and Affect: Mood normal.        Speech: Speech normal.        Behavior: Behavior is cooperative.         Assessment & Plan:    Encounter Diagnoses   Name Primary?  . Encounter to establish care Yes  . Cough   . Suspected 2019 novel coronavirus infection      -covid 19 test -pt counseled on isolation -will schedule follow up appointment when call pt with test results.  All pt questions are answered.

## 2019-04-08 LAB — NOVEL CORONAVIRUS, NAA: SARS-CoV-2, NAA: NOT DETECTED

## 2019-04-09 ENCOUNTER — Encounter: Payer: Self-pay | Admitting: Physician Assistant

## 2019-04-09 ENCOUNTER — Telehealth: Payer: Self-pay | Admitting: Physician Assistant

## 2019-04-09 NOTE — Telephone Encounter (Signed)
Called pt to review covid 19 test results and schedule follow up appointment.  Left VM for pt to call office.

## 2019-05-13 ENCOUNTER — Other Ambulatory Visit: Payer: Self-pay

## 2019-05-13 DIAGNOSIS — Z20822 Contact with and (suspected) exposure to covid-19: Secondary | ICD-10-CM

## 2019-05-14 LAB — NOVEL CORONAVIRUS, NAA: SARS-CoV-2, NAA: NOT DETECTED

## 2020-01-11 ENCOUNTER — Other Ambulatory Visit: Payer: Self-pay | Admitting: Physician Assistant

## 2020-01-11 DIAGNOSIS — Z13220 Encounter for screening for lipoid disorders: Secondary | ICD-10-CM

## 2020-01-11 DIAGNOSIS — Z131 Encounter for screening for diabetes mellitus: Secondary | ICD-10-CM

## 2020-01-19 ENCOUNTER — Other Ambulatory Visit (HOSPITAL_COMMUNITY)
Admission: RE | Admit: 2020-01-19 | Discharge: 2020-01-19 | Disposition: A | Discharge status: Home / Self Care | Payer: Self-pay | Source: Ambulatory Visit | Attending: Physician Assistant | Admitting: Physician Assistant

## 2020-01-19 ENCOUNTER — Other Ambulatory Visit: Payer: Self-pay

## 2020-01-19 DIAGNOSIS — Z131 Encounter for screening for diabetes mellitus: Secondary | ICD-10-CM | POA: Insufficient documentation

## 2020-01-19 DIAGNOSIS — Z13220 Encounter for screening for lipoid disorders: Secondary | ICD-10-CM | POA: Insufficient documentation

## 2020-01-19 LAB — COMPREHENSIVE METABOLIC PANEL
ALT: 56 U/L — ABNORMAL HIGH (ref 0–44)
AST: 36 U/L (ref 15–41)
Albumin: 4.2 g/dL (ref 3.5–5.0)
Alkaline Phosphatase: 78 U/L (ref 38–126)
Anion gap: 8 (ref 5–15)
BUN: 14 mg/dL (ref 6–20)
CO2: 24 mmol/L (ref 22–32)
Calcium: 8.7 mg/dL — ABNORMAL LOW (ref 8.9–10.3)
Chloride: 103 mmol/L (ref 98–111)
Creatinine, Ser: 0.6 mg/dL (ref 0.44–1.00)
GFR calc Af Amer: >60 mL/min (ref >60)
GFR calc non Af Amer: >60 mL/min (ref >60)
Glucose, Bld: 97 mg/dL (ref 70–99)
Potassium: 3.6 mmol/L (ref 3.5–5.1)
Sodium: 135 mmol/L (ref 135–145)
Total Bilirubin: 0.5 mg/dL (ref 0.3–1.2)
Total Protein: 7.2 g/dL (ref 6.5–8.1)

## 2020-01-19 LAB — LIPID PANEL
Cholesterol: 160 mg/dL (ref 0–200)
HDL: 38 mg/dL — ABNORMAL LOW (ref >40)
LDL Cholesterol: 93 mg/dL (ref 0–99)
Total CHOL/HDL Ratio: 4.2 RATIO
Triglycerides: 143 mg/dL (ref <150)
VLDL: 29 mg/dL (ref 0–40)

## 2020-01-19 LAB — HEMOGLOBIN A1C
Hgb A1c MFr Bld: 5.4 % (ref 4.8–5.6)
Mean Plasma Glucose: 108.28 mg/dL

## 2020-01-24 ENCOUNTER — Other Ambulatory Visit: Payer: Self-pay

## 2020-01-24 ENCOUNTER — Ambulatory Visit: Payer: Self-pay | Admitting: Physician Assistant

## 2020-01-24 ENCOUNTER — Encounter: Payer: Self-pay | Admitting: Physician Assistant

## 2020-01-24 VITALS — BP 100/60 | HR 96 | Temp 98.2°F | Ht 59.25 in | Wt 173.8 lb

## 2020-01-24 DIAGNOSIS — R7989 Other specified abnormal findings of blood chemistry: Secondary | ICD-10-CM

## 2020-01-24 DIAGNOSIS — E669 Obesity, unspecified: Secondary | ICD-10-CM

## 2020-01-24 DIAGNOSIS — Z0000 Encounter for general adult medical examination without abnormal findings: Secondary | ICD-10-CM

## 2020-01-24 NOTE — Progress Notes (Signed)
BP 100/60   Pulse 96   Temp 98.2 F (36.8 C)   Ht 4' 11.25" (1.505 m)   Wt 173 lb 12 oz (78.8 kg)   SpO2 98%   BMI 34.80 kg/m    Subjective:    Patient ID: Samantha Khan, female    DOB: Feb 08, 1991, 29 y.o.   MRN: [id number]  HPI: Samantha Khan is a 29 y.o. female presenting on 01/24/2020 for Follow-up   HPI    Pt had a negative covid 19 screening questionnaire.    Pt is 29yoF with routine follow up today.    She got covid vaccinaton  She doesn't work.  She doesn't drink or smoke.   She is Doing well and has no complaints    Relevant past medical, surgical, family and social history reviewed and updated as indicated. Interim medical history since our last visit reviewed. Allergies and medications reviewed and updated.  No current outpatient medications on file.    Review of Systems  Per HPI unless specifically indicated above     Objective:    BP 100/60   Pulse 96   Temp 98.2 F (36.8 C)   Ht 4' 11.25" (1.505 m)   Wt 173 lb 12 oz (78.8 kg)   SpO2 98%   BMI 34.80 kg/m   Wt Readings from Last 3 Encounters:  01/24/20 173 lb 12 oz (78.8 kg)  01/21/18 181 lb (82.1 kg)  01/20/18 181 lb (82.1 kg)    Physical Exam Vitals reviewed.  Constitutional:      General: She is not in acute distress.    Appearance: She is well-developed.  HENT:     Head: Normocephalic and atraumatic.  Cardiovascular:     Rate and Rhythm: Normal rate and regular rhythm.  Pulmonary:     Effort: Pulmonary effort is normal.     Breath sounds: Normal breath sounds.  Abdominal:     General: Bowel sounds are normal.     Palpations: Abdomen is soft. There is no mass.     Tenderness: There is no abdominal tenderness.  Musculoskeletal:     Cervical back: Neck supple.     Right lower leg: No edema.     Left lower leg: No edema.  Lymphadenopathy:     Cervical: No cervical adenopathy.  Skin:    General: Skin is warm and dry.  Neurological:     Mental Status: She is alert  and oriented to person, place, and time.  Psychiatric:        Behavior: Behavior normal.     Results for orders placed or performed during the hospital encounter of 01/19/20  Hemoglobin A1c  Result Value Ref Range   Hgb A1c MFr Bld 5.4 4.8 - 5.6 %   Mean Plasma Glucose 108.28 mg/dL  Lipid panel  Result Value Ref Range   Cholesterol 160 0 - 200 mg/dL   Triglycerides 579 <038 mg/dL   HDL 38 (L) >33 mg/dL   Total CHOL/HDL Ratio 4.2 RATIO   VLDL 29 0 - 40 mg/dL   LDL Cholesterol 93 0 - 99 mg/dL  Comprehensive metabolic panel  Result Value Ref Range   Sodium 135 135 - 145 mmol/L   Potassium 3.6 3.5 - 5.1 mmol/L   Chloride 103 98 - 111 mmol/L   CO2 24 22 - 32 mmol/L   Glucose, Bld 97 70 - 99 mg/dL   BUN 14 6 - 20 mg/dL   Creatinine, Ser 3.83 0.44 - 1.00 mg/dL  Calcium 8.7 (L) 8.9 - 10.3 mg/dL   Total Protein 7.2 6.5 - 8.1 g/dL   Albumin 4.2 3.5 - 5.0 g/dL   AST 36 15 - 41 U/L   ALT 56 (H) 0 - 44 U/L   Alkaline Phosphatase 78 38 - 126 U/L   Total Bilirubin 0.5 0.3 - 1.2 mg/dL   GFR calc non Af Amer >60 >60 mL/min   GFR calc Af Amer >60 >60 mL/min   Anion gap 8 5 - 15      Assessment & Plan:    Encounter Diagnoses  Name Primary?  . Well adult on routine health check Yes  . Elevated LFTs   . Obesity, unspecified classification, unspecified obesity type, unspecified whether serious comorbidity present        -revieewed labs with pt  -Will monitor LFTs -Pt to follow up 6 wk to update PAP

## 2020-03-06 ENCOUNTER — Encounter: Payer: Self-pay | Admitting: Physician Assistant

## 2020-03-06 ENCOUNTER — Ambulatory Visit: Payer: Self-pay | Admitting: Physician Assistant

## 2020-03-06 ENCOUNTER — Other Ambulatory Visit (HOSPITAL_COMMUNITY)
Admission: RE | Admit: 2020-03-06 | Discharge: 2020-03-06 | Disposition: A | Discharge status: Home / Self Care | Payer: Self-pay | Source: Ambulatory Visit | Attending: Physician Assistant | Admitting: Physician Assistant

## 2020-03-06 ENCOUNTER — Other Ambulatory Visit: Payer: Self-pay

## 2020-03-06 VITALS — BP 92/58 | HR 64 | Temp 97.7°F

## 2020-03-06 DIAGNOSIS — Z124 Encounter for screening for malignant neoplasm of cervix: Secondary | ICD-10-CM

## 2020-03-06 NOTE — Progress Notes (Signed)
   BP (!) 92/58   Pulse 64   Temp 97.7 F (36.5 C)   SpO2 97%    Subjective:    Patient ID: Samantha Khan, female    DOB: Nov 10, 1990, 29 y.o.   MRN: [id number]  HPI: Samantha Khan is a 29 y.o. female presenting on 03/06/2020 for No chief complaint on file.   HPI    Pt had a negative covid 19 screening questionnaire.     Pt is 29yoF who has appointment for routin PAP.    Her Last PAP 2017 - normal (no HPV testing)  She got covid vaccination but didn't bring her card  She says she is doing well and is having No problems  LMP 2 months ago.    She says she is always irregular and there is No chance of being pregnant.  She uses condoms for contraception.      Relevant past medical, surgical, family and social history reviewed and updated as indicated. Interim medical history since our last visit reviewed. Allergies and medications reviewed and updated.  No current outpatient medications on file.    Review of Systems  Per HPI unless specifically indicated above     Objective:    BP (!) 92/58   Pulse 64   Temp 97.7 F (36.5 C)   SpO2 97%   Wt Readings from Last 3 Encounters:  01/24/20 173 lb 12 oz (78.8 kg)  01/21/18 181 lb (82.1 kg)  01/20/18 181 lb (82.1 kg)    Physical Exam Vitals reviewed. Exam conducted with a chaperone present.  Constitutional:      General: She is not in acute distress.    Appearance: She is well-developed. She is not ill-appearing.  HENT:     Head: Normocephalic and atraumatic.  Pulmonary:     Effort: Pulmonary effort is normal.  Chest:     Breasts:        Right: Normal.        Left: Normal.  Abdominal:     Palpations: Abdomen is soft. There is no mass.     Tenderness: There is no abdominal tenderness. There is no guarding or rebound.  Genitourinary:    Labia:        Right: No rash, tenderness or lesion.        Left: No rash, tenderness or lesion.      Vagina: Normal.     Cervix: No cervical motion tenderness,  discharge or friability.     Adnexa:        Right: No mass, tenderness or fullness.         Left: No mass, tenderness or fullness.       Comments: Neysa Bonito V assisted) Skin:    General: Skin is warm and dry.  Neurological:     Mental Status: She is alert and oriented to person, place, and time.  Psychiatric:        Behavior: Behavior normal.            Assessment & Plan:    Encounter Diagnosis  Name Primary?  . Routine Papanicolaou smear Yes      -pt counseled on BSE -pt will be called with results PAP -pt to follow up 1 year.  She is to contact office sooner prn

## 2020-03-07 LAB — CYTOLOGY - PAP
Comment: NEGATIVE
Diagnosis: NEGATIVE
High risk HPV: NEGATIVE

## 2020-03-18 ENCOUNTER — Inpatient Hospital Stay (HOSPITAL_COMMUNITY)
Admission: AD | Admit: 2020-03-18 | Discharge: 2020-03-18 | Disposition: A | Discharge status: Home / Self Care | Payer: Self-pay | Attending: Obstetrics and Gynecology | Admitting: Obstetrics and Gynecology

## 2020-03-18 ENCOUNTER — Encounter (HOSPITAL_COMMUNITY): Payer: Self-pay | Admitting: Obstetrics and Gynecology

## 2020-03-18 ENCOUNTER — Other Ambulatory Visit: Payer: Self-pay

## 2020-03-18 ENCOUNTER — Inpatient Hospital Stay (HOSPITAL_COMMUNITY): Payer: Self-pay

## 2020-03-18 DIAGNOSIS — O3680X0 Pregnancy with inconclusive fetal viability, not applicable or unspecified: Secondary | ICD-10-CM | POA: Insufficient documentation

## 2020-03-18 DIAGNOSIS — Z3A12 12 weeks gestation of pregnancy: Secondary | ICD-10-CM | POA: Insufficient documentation

## 2020-03-18 DIAGNOSIS — Z6790 Unspecified blood type, Rh positive: Secondary | ICD-10-CM | POA: Insufficient documentation

## 2020-03-18 DIAGNOSIS — O4690 Antepartum hemorrhage, unspecified, unspecified trimester: Secondary | ICD-10-CM

## 2020-03-18 DIAGNOSIS — O468X1 Other antepartum hemorrhage, first trimester: Secondary | ICD-10-CM

## 2020-03-18 DIAGNOSIS — O208 Other hemorrhage in early pregnancy: Secondary | ICD-10-CM | POA: Insufficient documentation

## 2020-03-18 DIAGNOSIS — O418X10 Other specified disorders of amniotic fluid and membranes, first trimester, not applicable or unspecified: Secondary | ICD-10-CM

## 2020-03-18 LAB — POCT PREGNANCY, URINE: Preg Test, Ur: POSITIVE — AB

## 2020-03-18 LAB — WET PREP, GENITAL
Sperm: NONE SEEN
Trich, Wet Prep: NONE SEEN
Yeast Wet Prep HPF POC: NONE SEEN

## 2020-03-18 LAB — CBC
HCT: 33.2 % — ABNORMAL LOW (ref 36.0–46.0)
Hemoglobin: 10 g/dL — ABNORMAL LOW (ref 12.0–15.0)
MCH: 24.2 pg — ABNORMAL LOW (ref 26.0–34.0)
MCHC: 30.1 g/dL (ref 30.0–36.0)
MCV: 80.4 fL (ref 80.0–100.0)
Platelets: 292 10*3/uL (ref 150–400)
RBC: 4.13 MIL/uL (ref 3.87–5.11)
RDW: 16.2 % — ABNORMAL HIGH (ref 11.5–15.5)
WBC: 7.6 10*3/uL (ref 4.0–10.5)
nRBC: 0 % (ref 0.0–0.2)

## 2020-03-18 LAB — HCG, QUANTITATIVE, PREGNANCY: hCG, Beta Chain, Quant, S: 10593 m[IU]/mL — ABNORMAL HIGH (ref <5)

## 2020-03-18 NOTE — MAU Provider Note (Signed)
History     CSN: [id number]  Arrival date and time: 03/18/20 9794   First Provider Initiated Contact with Patient 03/18/20 1139      Chief Complaint  Patient presents with  . Abdominal Pain  . Vaginal Bleeding   29 y.o G3P2002 @[redacted]w[redacted]d  by LMP presenting with VB and cramping. Sx started 1 week ago. She is partially staining 3 pads a day. Reports mild uterine cramping. Rates 4/10. Has not taken anything for it. Reports irregular menses and didn't realize she was pregnant until last week.  OB History    Gravida  3   Para  2   Term  2   Preterm      AB      Living  2     SAB      TAB      Ectopic      Multiple      Live Births  2           Past Medical History:  Diagnosis Date  . Abnormal Pap smear of cervix   . Anal fissure 02/2013  . Medical history non-contributory   . Pregnancy   . Supervision of normal pregnancy 07/21/2017    [facility] Results Initiated care at 13wk Pap  04/17/2016 negative RCHD  Dating by 1st trimester U/S 11wk GC/CT Initial:             -/-       36wks:     -/- Support Person [facility] NT/IT:    neg Flu vaccine   CF: 07/20/12 neg        SMA:                Sickle Cell:07/20/12 neg Tdap vaccine Recommended ~28wks  5/29 Blood type  O+               Rhogam:    Antibody Negativ    Past Surgical History:  Procedure Laterality Date  . COLPOSCOPY  03/2011  . NO PAST SURGERIES      Family History  Problem Relation Age of Onset  . Hypertension Maternal Grandmother   . Diabetes Maternal Grandmother   . Diabetes Paternal Grandmother   . Diabetes Paternal Grandfather   . Diabetes Maternal Grandfather     Social History   Tobacco Use  . Smoking status: Never Smoker  . Smokeless tobacco: Never Used  Substance Use Topics  . Alcohol use: No  . Drug use: No    Allergies: No Known Allergies  No medications prior to admission.    Review of Systems  Gastrointestinal: Positive for abdominal pain.   Genitourinary: Positive for vaginal bleeding.   Physical Exam   Blood pressure (!) 113/55, pulse 71, temperature 98.8 F (37.1 C), temperature source Oral, resp. rate 16, height 4\' 11"  (1.499 m), weight 79.3 kg, last menstrual period 12/20/2019, SpO2 100 %, unknown if currently breastfeeding.  Physical Exam Vitals and nursing note reviewed. Exam conducted with a chaperone present.  Constitutional:      General: She is not in acute distress.    Appearance: She is well-developed.  HENT:     Head: Normocephalic and atraumatic.  Cardiovascular:     Rate and Rhythm: Normal rate.  Pulmonary:     Effort: Pulmonary effort is normal. No respiratory distress.  Abdominal:     General: There is no distension.     Palpations: There is no mass.     Tenderness: There is  no abdominal tenderness. There is no guarding or rebound.     Hernia: No hernia is present.  Genitourinary:    Comments: External: no lesions or erythema Vagina: scant bloody discharge Uterus: +enlarged, anteverted, non tender, no CMT Adnexae: no masses, no tenderness left, no tenderness right Cervix closed  Musculoskeletal:        General: Normal range of motion.  Skin:    General: Skin is warm and dry.  Neurological:     General: No focal deficit present.     Mental Status: She is alert and oriented to person, place, and time.  Psychiatric:        Mood and Affect: Mood normal.    Results for orders placed or performed during the hospital encounter of 03/18/20 (from the past 24 hour(s))  Pregnancy, urine POC     Status: Abnormal   Collection Time: 03/18/20  9:01 AM  Result Value Ref Range   Preg Test, Ur POSITIVE (A) NEGATIVE  CBC     Status: Abnormal   Collection Time: 03/18/20  9:57 AM  Result Value Ref Range   WBC 7.6 4.0 - 10.5 K/uL   RBC 4.13 3.87 - 5.11 MIL/uL   Hemoglobin 10.0 (L) 12.0 - 15.0 g/dL   HCT 17.6 (L) 36 - 46 %   MCV 80.4 80.0 - 100.0 fL   MCH 24.2 (L) 26.0 - 34.0 pg   MCHC 30.1 30.0 - 36.0  g/dL   RDW 16.0 (H) 73.7 - 10.6 %   Platelets 292 150 - 400 K/uL   nRBC 0.0 0.0 - 0.2 %  hCG, quantitative, pregnancy     Status: Abnormal   Collection Time: 03/18/20  9:57 AM  Result Value Ref Range   hCG, Beta Chain, Quant, S 10,593 (H) <5 mIU/mL  Wet prep, genital     Status: Abnormal   Collection Time: 03/18/20 11:46 AM  Result Value Ref Range   Yeast Wet Prep HPF POC NONE SEEN NONE SEEN   Trich, Wet Prep NONE SEEN NONE SEEN   Clue Cells Wet Prep HPF POC PRESENT (A) NONE SEEN   WBC, Wet Prep HPF POC MANY (A) NONE SEEN   Sperm NONE SEEN    US OB LESS THAN 14 WEEKS WITH OB TRANSVAGINAL  Result Date: 03/18/2020 CLINICAL DATA:  Supervision of first trimester pregnancy, vaginal bleeding and pain, LMP = 12/20/2019, quantitative beta HCG = 10,593 EXAM: OBSTETRIC <14 WK [place] AND TRANSVAGINAL OB US TECHNIQUE: Both transabdominal and transvaginal ultrasound examinations were performed for complete evaluation of the gestation as well as the maternal uterus, adnexal regions, and pelvic cul-de-sac. Transvaginal technique was performed to assess early pregnancy. COMPARISON:  None FINDINGS: Intrauterine gestational sac: Present, single Yolk sac:  Not identified Embryo:  Not identified Cardiac Activity: N/A Heart Rate: N/A  bpm MSD: 11.8 mm   5 w   6 d Subchorionic hemorrhage:  Small subchronic hemorrhage Maternal uterus/adnexae: LEFT ovary measures 3.0 x 2.0 x 2.7 cm and contains a small corpus luteum. RIGHT ovary normal size and morphology 2.4 x 1.7 x 1.5 cm. No free pelvic fluid or adnexal masses. IMPRESSION: Small gestational sac identified within the uterus with small adjacent subchorionic hemorrhage. No fetal pole identified to establish viability; may consider follow-up ultrasound in 14 days to establish viability, if clinically indicated. Electronically Signed   By: Ulyses Southward M.D.   On: 03/18/2020 12:44   MAU Course  Procedures  MDM Labs and [place] ordered and reviewed. Likely early pregnancy  with  +IUGS but no YS or FP seen therefore cannot r/o ectopic pregnancy or failed pregnancy, discussed with pt. Will follow quant in 48 hrs. Stable for discharge home.   Assessment and Plan   1. Pregnancy, location unknown   2. Vaginal bleeding in pregnancy   3. Blood type, Rh positive   4. Subchorionic hematoma in first trimester, single or unspecified fetus    Discharge home Follow up at [facility] on 03/20/20 Ectopic/SAB precautions Pelvic rest  Allergies as of 03/18/2020   No Known Allergies     Medication List    You have not been prescribed any medications.    Donette Larry, CNM 03/18/2020, 1:44 PM

## 2020-03-18 NOTE — MAU Note (Signed)
Received call from lab and they said urine sample did not have lid on the cup. Labs states sample needs to be recollected

## 2020-03-18 NOTE — Discharge Instructions (Signed)
Hematoma subcoriónico °Subchorionic Hematoma ° °Un hematoma subcoriónico es una acumulación de sangre entre la pared externa del embrión (corion) y la pared interna de la matriz (útero). °Esta afección puede causar hemorragia vaginal. Si causan poca o nada de hemorragia vaginal, generalmente, los hematomas pequeños que ocurren al principio del embarazo se reducen por su propia cuenta y no afectan al bebé ni al embarazo. Cuando la hemorragia comienza más tarde en el embarazo, o el hematoma es más grande o se produce en una paciente de edad avanzada, la afección puede ser más grave. Los hematomas más grandes pueden agrandarse aún más, lo que aumenta las posibilidades de aborto espontáneo. Esta afección también aumenta los siguientes riesgos: °· Separación prematura de la placenta del útero. °· Parto antes de término (prematuro). °· Muerte fetal. °¿Cuáles son las causas? °Se desconoce la causa exacta de esta afección. Ocurre cuando la sangre queda atrapada entre la placenta y la pared uterina porque la placenta se ha separado del lugar original del implante. °¿Qué incrementa el riesgo? °Es más probable que desarrolle esta afección si: °· Recibió tratamiento con medicamentos para la fertilidad. °· La concepción se realizó a través de la fertilización in vitro (FIV). °¿Cuáles son los signos o los síntomas? °Los síntomas de esta afección incluyen los siguientes: °· Pérdida o hemorragia vaginal. °· Contracciones del útero. Estas contracciones provocan dolor abdominal. °En ocasiones, puede no haber síntomas y la hemorragia solo se puede ver cuando se toman imágenes ecográficas (ecografía transvaginal). °¿Cómo se diagnostica? °Esta afección se diagnostica con un examen físico. Es un examen pélvico. También pueden hacerle otros estudios, por ejemplo: °· Análisis de sangre. °· Análisis de orina. °· Ecografía del abdomen. °¿Cómo se trata? °El tratamiento de esta afección puede variar. El tratamiento puede incluir lo  siguiente: °· Observación cautelosa. La observarán atentamente para detectar cualquier cambio en la hemorragia. Durante esta etapa: °? El hematoma puede reabsorberse en el cuerpo. °? El hematoma puede separar el espacio lleno de líquido que contiene al embrión (saco gestacional) de la pared del útero (endometrio). °· Medicamentos. °· Restricción de las actividades. Puede ser necesaria hasta que se detenga la hemorragia. °Siga estas indicaciones en su casa: °· Haga reposo en cama si se lo indica el médico. °· No levante ningún objeto que pese más de 10 libras (4,5 kg) o siga las indicaciones del médico. °· No consuma ningún producto que contenga nicotina o tabaco, como cigarrillos y cigarrillos electrónicos. Si necesita ayuda para dejar de fumar, consulte al médico. °· Lleve un registro escrito de la cantidad de toallas higiénicas que utiliza cada día y cuán empapadas (saturadas) están. °· No use tampones. °· Concurra a todas las visitas de control como se lo haya indicado el médico. Esto es importante. El profesional podrá pedirle que se realice análisis de seguimiento, ecografías o ambas. °Comuníquese con un médico si: °· Tiene una hemorragia vaginal. °· Tiene fiebre. °Solicite ayuda de inmediato si: °· Siente calambres intensos en el estómago, en la espalda, en el abdomen o en la pelvis. °· Elimina coágulos o tejidos grandes. Guarde los tejidos para que su médico los vea. °· Tiene más hemorragia vaginal, y se desmaya o se siente mareada o débil. °Resumen °· Un hematoma subcoriónico es una acumulación de sangre entre la pared externa de la placenta y el útero. °· Esta afección puede causar hemorragia vaginal. °· En ocasiones, puede no haber síntomas y la hemorragia solo se puede ver cuando se toman imágenes ecográficas. °· El tratamiento puede incluir una observación cautelosa, medicamentos   o restricción de las actividades. °Esta información no tiene como fin reemplazar el consejo del médico. Asegúrese de hacerle  al médico cualquier pregunta que tenga. °Document Revised: 04/02/2017 Document Reviewed: 04/02/2017 °Elsevier Patient Education © 2020 [employer] Inc[employer] ° °

## 2020-03-18 NOTE — MAU Note (Signed)
Samantha Khan is a 29 y.o. at [redacted]w[redacted]d here in MAU reporting: states she had a pap smear last Tuesday and has been bleeding since. Changing about 3 panty liners per day. She did a pregnancy test last Friday and today and they were positive. Having some cramping.  LMP: 12/20/19, states periods are irregular  Onset of complaint: ongoing  Pain score: 4/10  Vitals:   03/18/20 0921  BP: 109/68  Pulse: 68  Resp: 16  Temp: 98.8 F (37.1 C)  SpO2: 100%     Lab orders placed from triage: UPT UA

## 2020-03-19 LAB — GC/CHLAMYDIA PROBE AMP (~~LOC~~) NOT AT ARMC
Chlamydia: NEGATIVE
Comment: NEGATIVE
Comment: NORMAL
Neisseria Gonorrhea: NEGATIVE

## 2020-03-20 ENCOUNTER — Other Ambulatory Visit: Payer: Self-pay

## 2020-03-21 LAB — BETA HCG QUANT (REF LAB): hCG Quant: 7417 m[IU]/mL

## 2020-03-22 ENCOUNTER — Telehealth: Payer: Self-pay | Admitting: Adult Health

## 2020-03-22 DIAGNOSIS — O4690 Antepartum hemorrhage, unspecified, unspecified trimester: Secondary | ICD-10-CM

## 2020-03-22 NOTE — Telephone Encounter (Signed)
Pt was having bleeding bad yesterday and today and cramping calling to get lab resutls she thinks she may have already lost baby

## 2020-03-22 NOTE — Telephone Encounter (Signed)
Pt aware that [facility] is dropping and she is bleed more yesterday and today with cramping, will check QHCG on Monday and orders are in , take tylenol and increase fluids and rest and if pain increased go to MAU. Blood type RH+ per MAU note form 9/12. Had [place] then no fetal pole,had [facility]

## 2020-03-22 NOTE — Addendum Note (Signed)
Addended by: Cyril Mourning A on: 03/22/2020 05:55 PM   Modules accepted: Orders

## 2020-03-27 ENCOUNTER — Telehealth: Payer: Self-pay | Admitting: Adult Health

## 2020-03-27 ENCOUNTER — Other Ambulatory Visit: Payer: Self-pay | Admitting: Adult Health

## 2020-03-27 DIAGNOSIS — O039 Complete or unspecified spontaneous abortion without complication: Secondary | ICD-10-CM

## 2020-03-27 LAB — BETA HCG QUANT (REF LAB): hCG Quant: 115 m[IU]/mL

## 2020-03-27 NOTE — Telephone Encounter (Signed)
  Patient would like results of lab work  

## 2020-03-27 NOTE — Telephone Encounter (Signed)
Pt aware quant has dropped so this is a miscarriage. Will need to repeat lab in 1 week. Order is in so just go to lab on Monday. Can take Ibuprofen for cramps. Pt voiced understanding. JSY

## 2020-04-03 ENCOUNTER — Telehealth: Payer: Self-pay | Admitting: Adult Health

## 2020-04-03 LAB — BETA HCG QUANT (REF LAB): hCG Quant: 7 m[IU]/mL

## 2020-04-03 NOTE — Telephone Encounter (Signed)
Samantha Khan spoke with pt giving her results from lab. Pt states she wants to try again for pregnancy. JSY

## 2020-04-03 NOTE — Telephone Encounter (Signed)
Patient called stating that she had labs done the other day and she would like to know the results. Please contact pt

## 2020-07-07 NOTE — L&D Delivery Note (Signed)
OB/GYN Faculty Practice Delivery Note  Samantha Khan is a 30 y.o. [id number] s/p SVD at [redacted]w[redacted]d. She was admitted for SOL.   ROM: Ruptured membranes noted with delivery of infant, no ROM prior to admission per patient. Likely shortly before delivery < 30 min. GBS Status: Negative  Delivery Date/Time: 05/19/21 at 2354  Delivery: Called to room and patient was complete and pushing. Head delivered LOA. Nuchal cord present and delivered through. Shoulder and body delivered in usual fashion. Cord reduced after delivery. Infant with spontaneous cry, placed on mother's abdomen, dried and stimulated. Cord clamped x 2 after 1-minute delay and cut by FOB under my direct supervision. Cord blood drawn. Placenta delivered spontaneously with gentle cord traction. Fundus firm with massage and Pitocin. Labia, perineum, vagina, and cervix were inspected, and no lacerations were noted.   Placenta: Intact; 3VC - sent to L&D Complications: None  Lacerations: None EBL: 200 cc Analgesia: None  Infant: Viable female  APGARs 57 and 28  Evalina Field, MD OB/GYN Fellow, Faculty Practice

## 2020-10-09 ENCOUNTER — Other Ambulatory Visit: Payer: Self-pay

## 2020-10-09 ENCOUNTER — Encounter: Payer: Self-pay | Admitting: *Deleted

## 2020-10-09 ENCOUNTER — Ambulatory Visit (INDEPENDENT_AMBULATORY_CARE_PROVIDER_SITE_OTHER): Payer: Self-pay | Admitting: *Deleted

## 2020-10-09 VITALS — BP 110/58 | HR 70 | Ht 59.0 in | Wt 170.0 lb

## 2020-10-09 DIAGNOSIS — N926 Irregular menstruation, unspecified: Secondary | ICD-10-CM

## 2020-10-09 DIAGNOSIS — Z3201 Encounter for pregnancy test, result positive: Secondary | ICD-10-CM

## 2020-10-09 LAB — POCT URINE PREGNANCY: Preg Test, Ur: POSITIVE — AB

## 2020-10-09 NOTE — Progress Notes (Signed)
   NURSE VISIT- PREGNANCY CONFIRMATION   SUBJECTIVE:  Samantha Khan is a 30 y.o. G62P2002 female at Unknown by uncertain LMP of No LMP recorded (lmp unknown). Patient is pregnant. Here for pregnancy confirmation.  Home pregnancy test: positive x 2  She reports cramping and brown discharge.  She is taking prenatal vitamins.    OBJECTIVE:  BP (!) 110/58 (BP Location: Left Arm, Patient Position: Sitting, Cuff Size: Normal)   Pulse 70   Ht 4\' 11"  (1.499 m)   Wt 170 lb (77.1 kg)   LMP  (LMP Unknown)   Breastfeeding No   BMI 34.34 kg/m   Appears well, in no apparent distress OB History  Gravida Para Term Preterm AB Living  4 2 2     2   SAB IAB Ectopic Multiple Live Births          2    # Outcome Date GA Lbr Len/2nd Weight Sex Delivery Anes PTL Lv  4 Current           3 Term 01/21/18 [redacted]w[redacted]d   F Vag-Spont   LIV  2 Term 12/02/12 [redacted]w[redacted]d 19:45 / 01:22 8 lb 14 oz (4.026 kg) M Vag-Spont EPI N LIV  1 Gravida             Results for orders placed or performed in visit on 10/09/20 (from the past 24 hour(s))  POCT urine pregnancy   Collection Time: 10/09/20  4:15 PM  Result Value Ref Range   Preg Test, Ur Positive (A) Negative    ASSESSMENT: Positive pregnancy test, Unknown by LMP    PLAN: Schedule for dating ultrasound in pending labs Prenatal vitamins: continue   Nausea medicines: not currently needed   OB packet given: Yes  12/09/20  10/09/2020 4:24 PM

## 2020-10-09 NOTE — Progress Notes (Signed)
Chart reviewed for nurse visit. Agree with plan of care.  Adline Potter, NP 10/09/2020 5:05 PM

## 2020-10-10 LAB — PROGESTERONE: Progesterone: 12.5 ng/mL

## 2020-10-10 LAB — BETA HCG QUANT (REF LAB): hCG Quant: 76207 m[IU]/mL

## 2020-10-15 ENCOUNTER — Other Ambulatory Visit: Payer: Self-pay | Admitting: Obstetrics & Gynecology

## 2020-10-15 DIAGNOSIS — O3680X0 Pregnancy with inconclusive fetal viability, not applicable or unspecified: Secondary | ICD-10-CM

## 2020-10-16 ENCOUNTER — Other Ambulatory Visit: Payer: Self-pay

## 2020-10-16 ENCOUNTER — Ambulatory Visit (INDEPENDENT_AMBULATORY_CARE_PROVIDER_SITE_OTHER): Payer: Self-pay

## 2020-10-16 DIAGNOSIS — O3680X0 Pregnancy with inconclusive fetal viability, not applicable or unspecified: Secondary | ICD-10-CM

## 2020-10-16 NOTE — Progress Notes (Signed)
[place] 8+1 wks,single IUP with YS,positive FHT 171 bpm,simple left ovarian cyst 3.6 x 3.4 x 3.3 cm,simple right ovarian cyst 2.5 x 2.3 x 1.9 cm

## 2020-11-12 ENCOUNTER — Other Ambulatory Visit: Payer: Self-pay

## 2020-11-13 ENCOUNTER — Other Ambulatory Visit: Payer: Self-pay

## 2020-11-13 ENCOUNTER — Encounter: Payer: Self-pay | Admitting: Women's Health

## 2020-11-13 ENCOUNTER — Ambulatory Visit: Payer: Self-pay | Admitting: *Deleted

## 2020-11-14 ENCOUNTER — Other Ambulatory Visit: Payer: Self-pay | Admitting: Obstetrics & Gynecology

## 2020-11-14 DIAGNOSIS — Z3682 Encounter for antenatal screening for nuchal translucency: Secondary | ICD-10-CM

## 2020-11-15 ENCOUNTER — Encounter: Payer: Self-pay | Admitting: Women's Health

## 2020-11-15 ENCOUNTER — Ambulatory Visit (INDEPENDENT_AMBULATORY_CARE_PROVIDER_SITE_OTHER): Payer: Self-pay

## 2020-11-15 ENCOUNTER — Ambulatory Visit (INDEPENDENT_AMBULATORY_CARE_PROVIDER_SITE_OTHER): Payer: Self-pay | Admitting: Women's Health

## 2020-11-15 ENCOUNTER — Ambulatory Visit: Payer: Self-pay | Admitting: *Deleted

## 2020-11-15 ENCOUNTER — Other Ambulatory Visit: Payer: Self-pay

## 2020-11-15 VITALS — BP 99/52 | HR 73 | Wt 167.0 lb

## 2020-11-15 DIAGNOSIS — Z131 Encounter for screening for diabetes mellitus: Secondary | ICD-10-CM

## 2020-11-15 DIAGNOSIS — Z3A12 12 weeks gestation of pregnancy: Secondary | ICD-10-CM

## 2020-11-15 DIAGNOSIS — Z3490 Encounter for supervision of normal pregnancy, unspecified, unspecified trimester: Secondary | ICD-10-CM | POA: Insufficient documentation

## 2020-11-15 DIAGNOSIS — Z3480 Encounter for supervision of other normal pregnancy, unspecified trimester: Secondary | ICD-10-CM

## 2020-11-15 DIAGNOSIS — Z3682 Encounter for antenatal screening for nuchal translucency: Secondary | ICD-10-CM

## 2020-11-15 DIAGNOSIS — Z3481 Encounter for supervision of other normal pregnancy, first trimester: Secondary | ICD-10-CM

## 2020-11-15 LAB — POCT URINALYSIS DIPSTICK OB
Blood, UA: NEGATIVE
Glucose, UA: NEGATIVE
Ketones, UA: NEGATIVE
Leukocytes, UA: NEGATIVE
Nitrite, UA: NEGATIVE
POC,PROTEIN,UA: NEGATIVE

## 2020-11-15 MED ORDER — PROMETHAZINE HCL 25 MG PO TABS: 12.5000 mg | ORAL_TABLET | Freq: Four times a day (QID) | ORAL | 0 refills | Status: DC | PRN

## 2020-11-15 NOTE — Progress Notes (Signed)
[place] 12+3 wks,measurements c/w dates,fhr 155 bpm,normal ovaries,anterior placenta,NB present,NT 1.4 mm,simple left ovarian cyst 3.1 x 2.2 x 3.2 cm,normal right ovary

## 2020-11-15 NOTE — Progress Notes (Signed)
INITIAL OBSTETRICAL VISIT Patient name: Samantha Khan MRN [id number]  Date of birth: Jan 24, 1991 Chief Complaint:   Initial Prenatal Visit  History of Present Illness:   Samantha Khan is a 30 y.o. [id number] Hispanic female at [redacted]w[redacted]d by [place] at 8 weeks with an Estimated Date of Delivery: 05/27/21 being seen today for her initial obstetrical visit.   Her obstetrical history is significant for term uncomplicated svb x 2, SAB x 1.   Today she reports nausea- requests meds. Darkened skin, splotches chest/back, AC. Low back pain. Depression screen [facility] 31/9 11/15/2020 07/21/2017  Decreased Interest 2 0  Down, Depressed, Hopeless 1 0  PHQ - 2 Score 3 0  Altered sleeping 2 0  Tired, decreased energy 2 1  Change in appetite 0 0  Feeling bad or failure about yourself  0 0  Trouble concentrating 0 0  Moving slowly or fidgety/restless 0 0  Suicidal thoughts 0 0  PHQ-9 Score 7 1    No LMP recorded (lmp unknown). Patient is pregnant. Last pap 03/06/20. Results were: NILM w/ HRHPV negative Review of Systems:   Pertinent items are noted in HPI Denies cramping/contractions, leakage of fluid, vaginal bleeding, abnormal vaginal discharge w/ itching/odor/irritation, headaches, visual changes, shortness of breath, chest pain, abdominal pain, severe nausea/vomiting, or problems with urination or bowel movements unless otherwise stated above.  Pertinent History Reviewed:  Reviewed past medical,surgical, social, obstetrical and family history.  Reviewed problem list, medications and allergies. OB History  Gravida Para Term Preterm AB Living  4 2 2   1 2   SAB IAB Ectopic Multiple Live Births  1       2    # Outcome Date GA Lbr Len/2nd Weight Sex Delivery Anes PTL Lv  4 Current           3 SAB 03/18/20 [redacted]w[redacted]d         2 Term 01/21/18 [redacted]w[redacted]d  7 lb 14 oz (3.572 kg) F Vag-Spont EPI N LIV  1 Term 12/02/12 106w3d 19:45 / 01:22 8 lb 14 oz (4.026 kg) M Vag-Spont EPI N LIV   Physical Assessment:   Vitals:    11/15/20 1105  BP: (!) 99/52  Pulse: 73  Weight: 167 lb (75.8 kg)  Body mass index is 33.73 kg/m.       Physical Examination:  General appearance - well appearing, and in no distress  Mental status - alert, oriented to person, place, and time  Psych:  She has a normal mood and affect  Skin - warm and dry, normal color, no suspicious lesions noted;hyperpigmentation chest/back/AC  Chest - effort normal, all lung fields clear to auscultation bilaterally  Heart - normal rate and regular rhythm  Abdomen - soft, nontender  Extremities:  No swelling or varicosities noted  Thin prep pap is not done   Chaperone: N/A    TODAY'S NT  01/15/21 12+3 wks,measurements c/w dates,fhr 155 bpm,normal ovaries,anterior placenta,NB present,NT 1.4 mm,simple left ovarian cyst 3.1 x 2.2 x 3.2 cm,normal right ovary  Results for orders placed or performed in visit on 11/15/20 (from the past 24 hour(s))  POC Urinalysis Dipstick OB   Collection Time: 11/15/20 11:45 AM  Result Value Ref Range   Color, UA     Clarity, UA     Glucose, UA Negative Negative   Bilirubin, UA     Ketones, UA neg    Spec Grav, UA     Blood, UA neg    pH, UA  POC,PROTEIN,UA Negative Negative, Trace, Small (1+), Moderate (2+), Large (3+), 4+   Urobilinogen, UA     Nitrite, UA neg    Leukocytes, UA Negative Negative   Appearance     Odor      Assessment & Plan:  1) Low-Risk Pregnancy [id number] at [redacted]w[redacted]d with an Estimated Date of Delivery: 05/27/21   2) Initial OB visit  3) Low back pain> gave printed prevention/relief measures   4) Simple Lt ovarian cyst> discussed  5) Hyperpigmentation> chest/back/AC, check A1C, discussed can be hormonal  6) Nausea> rx phenergan  7) Bicornuate uterus  Meds:  Meds ordered this encounter  Medications  . promethazine (PHENERGAN) 25 MG tablet    Sig: Take 0.5-1 tablets (12.5-25 mg total) by mouth every 6 (six) hours as needed for nausea or vomiting.    Dispense:  30 tablet    Refill:  0     Order Specific Question:   Supervising Provider    Answer:   Duane Lope H [[id number]]    Initial labs obtained Continue prenatal vitamins Reviewed n/v relief measures and warning s/s to report Reviewed recommended weight gain based on pre-gravid BMI Encouraged well-balanced diet Genetic & carrier screening discussed: requests Panorama, NT/IT and Horizon 14  Ultrasound discussed; fetal survey: requested CCNC completed> form faxed if has or is planning to apply for medicaid The nature of [employer] for [employer] with multiple MDs and other Advanced Practice Providers was explained to patient; also emphasized that fellows, residents, and students are part of our team. Does not have home bp cuff. Office bp cuff given: yes. Check bp weekly, let us know if consistently >140/90.   Follow-up: Return in about 3 weeks (around 12/06/2020) for LROB, 2nd IT, in person, CNM.   Orders Placed This Encounter  Procedures  . Urine Culture  . GC/Chlamydia Probe Amp  . Genetic Screening  . Integrated 1  . Pain Management Screening Profile (10S)  . Obstetric Panel, Including HIV  . Hepatitis C antibody  . Hemoglobin A1c  . POC Urinalysis Dipstick OB    Cheral Marker CNM, [facility] 11/15/2020 12:38 PM

## 2020-11-15 NOTE — Patient Instructions (Addendum)
Rayetta Pigg, I greatly value your feedback.  If you receive a survey following your visit with [place] today, we appreciate you taking the time to fill it out.  Thanks, Joellyn Haff, CNM, WHNP-BC   [facility] ([address] [place], [place] [address]) Entrance C, located off of [address] valet parking   For your lower back pain you may:  Purchase a pregnancy/maternity support belt from [employer], [employer], [employer], [employer], etc and wear it while you are up and about  Take warm baths  Use a heating pad to your lower back for no longer than 20 minutes at a time, and do not place near abdomen  Take tylenol as needed. Please follow directions on the bottle  Kinesiology tape (can get from sporting goods store), google how to tape belly for pregnancy    Nausea & Vomiting  Have saltine crackers or pretzels by your bed and eat a few bites before you raise your head out of bed in the morning  Eat small frequent meals throughout the day instead of large meals  Drink plenty of fluids throughout the day to stay hydrated, just don't drink a lot of fluids with your meals.  This can make your stomach fill up faster making you feel sick  Do not brush your teeth right after you eat  Products with real ginger are good for nausea, like ginger ale and ginger hard candy Make sure it says made with real ginger!  Sucking on sour candy like lemon heads is also good for nausea  If your prenatal vitamins make you nauseated, take them at night so you will sleep through the nausea  Sea Bands  If you feel like you need medicine for the nausea & vomiting please let us know  If you are unable to keep any fluids or food down please let us know   Constipation  Drink plenty of fluid, preferably water, throughout the day  Eat foods high in fiber such as fruits, vegetables, and grains  Exercise, such as walking, is a good way to keep your bowels  regular  Drink warm fluids, especially warm prune juice, or decaf coffee  Eat a 1/2 cup of real oatmeal (not instant), 1/2 cup applesauce, and 1/2-1 cup warm prune juice every day  If needed, you may take Colace (docusate sodium) stool softener once or twice a day to help keep the stool soft.   If you still are having problems with constipation, you may take Miralax once daily as needed to help keep your bowels regular.   Home Blood Pressure Monitoring for Patients   Your provider has recommended that you check your blood pressure (BP) at least once a week at home. If you do not have a blood pressure cuff at home, one will be provided for you. Contact your provider if you have not received your monitor within 1 week.   Helpful Tips for Accurate Home Blood Pressure Checks  . Don't smoke, exercise, or drink caffeine 30 minutes before checking your BP . Use the restroom before checking your BP (a full bladder can raise your pressure) . Relax in a comfortable upright chair . Feet on the ground . Left arm resting comfortably on a flat surface at the level of your heart . Legs uncrossed . Back supported . Sit quietly and don't talk . Place the cuff on your bare arm . Adjust snuggly, so that only two fingertips can fit  between your skin and the top of the cuff . Check 2 readings separated by at least one minute . Keep a log of your BP readings . For a visual, please reference this diagram: http://ccnc.care/bpdiagram  Provider Name: [facility]     Phone: [phone]  Zone 1: ALL CLEAR  Continue to monitor your symptoms:  . BP reading is less than 140 (top number) or less than 90 (bottom number)  . No right upper stomach pain . No headaches or seeing spots . No feeling nauseated or throwing up . No swelling in face and hands  Zone 2: CAUTION Call your doctor's office for any of the following:  . BP reading is greater than 140 (top number) or greater than 90 (bottom number)   . Stomach pain under your ribs in the middle or right side . Headaches or seeing spots . Feeling nauseated or throwing up . Swelling in face and hands  Zone 3: EMERGENCY  Seek immediate medical care if you have any of the following:  . BP reading is greater than160 (top number) or greater than 110 (bottom number) . Severe headaches not improving with Tylenol . Serious difficulty catching your breath . Any worsening symptoms from Zone 2    Obstetrics: Normal and Problem Pregnancies (7th ed., pp. 102-121). [place], [place]: Elsevier."> Textbook of Family Medicine (9th ed., pp. [phone]). [place] Restaurant manager, fast foodlsevier Saunders.">  Primer trimestre de Psychiatristembarazo First Trimester of Pregnancy  El primer trimestre de Community education officerembarazo comienza el primer da de su ltimo periodo menstrual hasta el final de la semana 12. Es Designer, jewellerydecir desde el mes 1 hasta el mes 3 de Sauk Centreembarazo. Una semana despus de que un espermatozoide fecunda un vulo, este se implantar en la pared uterina y comenzar a desarrollarse hasta convertirse en un beb. Al final de las 12 [address] Cir[address]emanas, se formarn todos los rganos del beb y el beb tendr un tamao de Bartelsoentre 2 y 3 pulgadas. Durante Financial risk analystel primer trimestre, ocurren cambios en el cuerpo Su organismo atraviesa por muchos cambios durante el Riverpointembarazo. Los cambios varan y generalmente vuelven a la normalidad despus del nacimiento del beb. Cambios fsicos  Usted puede aumentar o bajar de Rotondapeso.  Las ConAgra Foodsmamas pueden empezar a Government social research officeragrandarse y Emergency planning/management officerestar sensibles. El tejido que rodea los pezones (arola) puede tornarse ms oscuro.  Pueden aparecer zonas oscuras o manchas (cloasma o mscara del embarazo) en el rostro.  Tal vez haya cambios en el cabello. Estos pueden incluir engrosamiento, afinamiento y Allied Waste Industriescambios en la textura. Cambios en la salud  Quizs sienta nuseas y es posible que vomite.  Es posible que tenga Palauacidez estomacal.  Comienza a tener dolores de Turkmenistancabeza.  Puede tener estreimiento.  Las  Veterinary surgeonencas pueden sangrar y estar sensibles al cepillado y al hilo dental. Otros cambios  Puede cansarse con facilidad.  Puede orinar con mayor frecuencia.  Los perodos menstruales se interrumpirn.  Tal vez no tenga apetito.  Puede sentir un fuerte deseo de consumir ciertos alimentos.  Puede tener cambios en sus emociones de un da para Therapist, artotro.  Tendr sueos ms vvidos y extraos. Siga estas instrucciones en su casa: Medicamentos  Siga las instrucciones del mdico en relacin con el uso de medicamentos. Durante el embarazo, hay medicamentos que pueden tomarse y otros que no. No use ningn medicamento a menos que se los haya indicado el mdico.  Tome vitaminas prenatales que contengan por lo menos 600microgramos (mcg) de cido flico. Comida y bebida  Lleve una dieta saludable que incluya frutas  y verduras frescas, cereales integrales, buenas fuentes de protenas como carnes magras, huevos o tofu, y productos lcteos descremados.  Evite la carne cruda y el [place], la [place] y el queso sin Market researcher. Estos portan grmenes que pueden provocar dao tanto a usted como al beb.  Siente nuseas o vomita: ? Ingiera 4 o 5comidas pequeas por Geophysical data processor de 3abundantes. ? Intente comer algunas galletitas saladas. ? Beba lquidos [employer], en lugar de [employer].  Es posible que tenga que tomar estas medidas para prevenir o tratar el estreimiento: ? Product manager suficiente lquido como para Pharmacologist la orina de color amarillo plido. ? Consumir alimentos ricos en fibra, como frijoles, cereales integrales, y frutas y verduras frescas. ? Limitar el consumo de alimentos ricos en grasa y azcares procesados, como los alimentos fritos o dulces. Actividad  Haga ejercicio solamente como se lo haya indicado el mdico. La mayora de las personas pueden continuar su rutina de ejercicios durante el [place]. Intente realizar como mnimo de actividad fsica por lo menos  5das a la semana.  Deje de hacer ejercicio si le aparecen dolor o clicos en la parte baja del vientre o de la espalda.  Evite hacer ejercicio si hace mucho calor o humedad, o si se encuentra a una altitud elevada.  Evite levantar pesos [employer] Brands.  Si lo desea, puede seguir teniendo [employer] [place], salvo que el mdico le indique lo contrario. Alivio del dolor y del Dentist  Use un sostn que le brinde buen soporte para Engineer, materials de [place].  Descanse con las piernas elevadas si tiene calambres o dolor de cintura.  Si desarrolla venas abultadas (vrices) en las piernas: ? Use medias de compresin como se lo haya indicado el mdico. ? Eleve los pies durante , 3 o 4veces por da. ? Limite el consumo de sal en su dieta. Seguridad  Use el cinturn de seguridad en todo momento mientras conduce o va en auto.  Hable con el mdico si es vctima de [employer] o fsico.  Hable con el mdico si se siente triste o tiene pensamientos acerca de [place] dao a usted misma. Estilo de vida  No se d baos de inmersin en agua caliente, baos turcos ni saunas.  No se haga duchas vaginales. No use tampones ni toallas higinicas perfumadas.  No use remedios a base de hierbas, alcohol, drogas ilegales ni medicamentos que no estn aprobados por el mdico. Las sustancias qumicas de estos productos pueden daar al beb.  No consuma ningn producto que contenga nicotina o tabaco, como cigarrillos, cigarrillos electrnicos y tabaco de Theatre manager. Si necesita ayuda para dejar de fumar, consulte al mdico.  Evite el contacto con las bandejas sanitarias de los gatos y la tierra que estos animales usan. Estos elementos contienen bacterias que pueden causar defectos congnitos al beb y la posible prdida del beb en gestacin (feto) debido a un aborto espontneo o muerte fetal. Instrucciones generales  Durante las visitas prenatales de rutina del Engineer, maintenance trimestre, el mdico le har  un examen fsico, Education officer, environmental las pruebas necesarias y le preguntar cmo Merchant navy officer las cosas. Cumpla con todas las visitas de seguimiento. Esto es importante.  Pida ayuda si tiene necesidades nutricionales o de asesoramiento Academic librarian. El mdico puede aconsejarla o derivarla a especialistas para que la ayuden con diferentes necesidades.  Programe una cita con el dentista. En su casa, lvese los dientes con un cepillo dental suave. Psese el hilo dental suavemente.  Escriba sus preguntas. Llvelas  cuando concurra a las visitas prenatales. Dnde buscar ms informacin  American Pregnancy Association (Asociacin Americana del Embarazo): americanpregnancy.org  [employer] Corporation of Obstetricians and Gynecologists (Colegio Estadounidense de Obstetras y [place]): EmploymentAssurance.cz?  Office on Pitney Bowes (Oficina para la Salud de la Mujer): MightyReward.[email or web address].nz Comunquese con un mdico si tiene:  Research scientist (life sciences).  Grant Ruts.  Clicos leves, presin en la pelvis o dolor persistente en el abdomen.  Nuseas, vmitos o diarrea que duran 24 horas o ms.  Una secrecin vaginal con mal olor.  Dolor al Beatrix Shipper.  Una enfermedad contagiosa, como varicela, sarampin, virus de [place], VIH o hepatitis. Solicite ayuda inmediatamente si:  Tiene manchado o sangrado de la vagina.  Tiene dolor intenso o clicos en el abdomen.  Siente que le falta el aire o dolor en el pecho.  Sufre cualquier tipo de traumatismo, por ejemplo, debido a una cada o un accidente automovilstico.  Dolor, hinchazn o enrojecimiento nuevos en un brazo o una pierna, o un aumento de alguno de estos sntomas. Resumen  Financial risk analyst trimestre del Community education officer de su ltimo periodo menstrual hasta el final de la semana 12 (meses 1 al 3).  Hacer 4 o 5 comidas pequeas al Geophysical data processor de 3 comidas grandes tambin puede aliviar las nuseas y los vmitos.  No consuma ningn producto que contenga  nicotina o tabaco, como cigarrillos, cigarrillos electrnicos y tabaco de Theatre manager. Si necesita ayuda para dejar de fumar, consulte al mdico.  Cumpla con todas las visitas de seguimiento. Esto es importante. Esta informacin no tiene Theme park manager el consejo del mdico. Asegrese de hacerle al mdico cualquier pregunta que tenga. Document Revised: 12/26/2019 Document Reviewed: 12/26/2019 Elsevier Patient Education  2021 [employer].

## 2020-11-16 LAB — PMP SCREEN PROFILE (10S), URINE
Amphetamine Scrn, Ur: NEGATIVE ng/mL
BARBITURATE SCREEN URINE: NEGATIVE ng/mL
BENZODIAZEPINE SCREEN, URINE: NEGATIVE ng/mL
CANNABINOIDS UR QL SCN: NEGATIVE ng/mL
Cocaine (Metab) Scrn, Ur: NEGATIVE ng/mL
Creatinine(Crt), U: 36 mg/dL (ref 20.0–300.0)
Methadone Screen, Urine: NEGATIVE ng/mL
OXYCODONE+OXYMORPHONE UR QL SCN: NEGATIVE ng/mL
Opiate Scrn, Ur: NEGATIVE ng/mL
Ph of Urine: 7.1 (ref 4.5–8.9)
Phencyclidine Qn, Ur: NEGATIVE ng/mL
Propoxyphene Scrn, Ur: NEGATIVE ng/mL

## 2020-11-16 LAB — HEMOGLOBIN A1C
Est. average glucose Bld gHb Est-mCnc: 105 mg/dL
Hgb A1c MFr Bld: 5.3 % (ref 4.8–5.6)

## 2020-11-16 LAB — MED LIST OPTION NOT SELECTED

## 2020-11-16 MED ORDER — FERROUS SULFATE 325 (65 FE) MG PO TABS: 325.0000 mg | ORAL_TABLET | ORAL | 2 refills | Status: DC

## 2020-11-16 NOTE — Addendum Note (Signed)
Addended by: Shawna Clamp R on: 11/16/2020 12:17 PM   Modules accepted: Orders

## 2020-11-18 LAB — GC/CHLAMYDIA PROBE AMP
Chlamydia trachomatis, NAA: NEGATIVE
Neisseria Gonorrhoeae by PCR: NEGATIVE

## 2020-11-20 LAB — OBSTETRIC PANEL, INCLUDING HIV
Antibody Screen: NEGATIVE
Basophils Absolute: 0 10*3/uL (ref 0.0–0.2)
Basos: 0 %
EOS (ABSOLUTE): 0.1 10*3/uL (ref 0.0–0.4)
Eos: 2 %
HIV Screen 4th Generation wRfx: NONREACTIVE
Hematocrit: 33.9 % — ABNORMAL LOW (ref 34.0–46.6)
Hemoglobin: 10.7 g/dL — ABNORMAL LOW (ref 11.1–15.9)
Hepatitis B Surface Ag: NEGATIVE
Immature Grans (Abs): 0 10*3/uL (ref 0.0–0.1)
Immature Granulocytes: 0 %
Lymphocytes Absolute: 0.6 10*3/uL — ABNORMAL LOW (ref 0.7–3.1)
Lymphs: 8 %
MCH: 23.9 pg — ABNORMAL LOW (ref 26.6–33.0)
MCHC: 31.6 g/dL (ref 31.5–35.7)
MCV: 76 fL — ABNORMAL LOW (ref 79–97)
Monocytes Absolute: 0.4 10*3/uL (ref 0.1–0.9)
Monocytes: 6 %
Neutrophils Absolute: 5.7 10*3/uL (ref 1.4–7.0)
Neutrophils: 84 %
Platelets: 271 10*3/uL (ref 150–450)
RBC: 4.47 x10E6/uL (ref 3.77–5.28)
RDW: 19.6 % — ABNORMAL HIGH (ref 11.7–15.4)
RPR Ser Ql: NONREACTIVE
Rh Factor: POSITIVE
Rubella Antibodies, IGG: 17.1 index (ref >0.99)
WBC: 6.9 10*3/uL (ref 3.4–10.8)

## 2020-11-20 LAB — INTEGRATED 1
Crown Rump Length: 65.3 mm
Gest. Age on Collection Date: 12.7 weeks
Maternal Age at EDD: 31.7 yr
Nuchal Translucency (NT): 1.4 mm
Number of Fetuses: 1
PAPP-A Value: 800.4 ng/mL
Weight: 167 [lb_av]

## 2020-11-20 LAB — HEPATITIS C ANTIBODY: Hep C Virus Ab: <0.1 s/co ratio (ref 0.0–0.9)

## 2020-11-25 LAB — URINE CULTURE

## 2020-11-26 ENCOUNTER — Telehealth: Payer: Self-pay | Admitting: Obstetrics & Gynecology

## 2020-11-26 ENCOUNTER — Other Ambulatory Visit: Payer: Self-pay | Admitting: Women's Health

## 2020-11-26 DIAGNOSIS — R8271 Bacteriuria: Secondary | ICD-10-CM

## 2020-11-26 DIAGNOSIS — O99891 Other specified diseases and conditions complicating pregnancy: Secondary | ICD-10-CM

## 2020-11-26 MED ORDER — NITROFURANTOIN MONOHYD MACRO 100 MG PO CAPS: 100.0000 mg | ORAL_CAPSULE | Freq: Two times a day (BID) | ORAL | 0 refills | Status: DC

## 2020-11-26 NOTE — Telephone Encounter (Signed)
Patient called stating that she has done the natera kit for her pregnancy but now she has received a bill for the cost and stating that they could help her with a discount to pay for the kit. Patient is a little confused and would like a call back. Please contact pt

## 2020-12-07 ENCOUNTER — Encounter: Payer: Self-pay | Admitting: Obstetrics & Gynecology

## 2020-12-13 ENCOUNTER — Encounter: Payer: Self-pay | Admitting: Women's Health

## 2020-12-25 ENCOUNTER — Encounter: Payer: Self-pay | Admitting: Obstetrics & Gynecology

## 2020-12-26 ENCOUNTER — Other Ambulatory Visit (HOSPITAL_COMMUNITY)
Admission: RE | Admit: 2020-12-26 | Discharge: 2020-12-26 | Disposition: A | Discharge status: Home / Self Care | Payer: Self-pay | Source: Ambulatory Visit | Attending: Obstetrics & Gynecology | Admitting: Obstetrics & Gynecology

## 2020-12-26 ENCOUNTER — Encounter: Payer: Self-pay | Admitting: Women's Health

## 2020-12-26 ENCOUNTER — Ambulatory Visit (INDEPENDENT_AMBULATORY_CARE_PROVIDER_SITE_OTHER): Payer: Self-pay | Admitting: Women's Health

## 2020-12-26 ENCOUNTER — Other Ambulatory Visit: Payer: Self-pay

## 2020-12-26 VITALS — BP 95/57 | HR 77 | Wt 170.0 lb

## 2020-12-26 DIAGNOSIS — Z1379 Encounter for other screening for genetic and chromosomal anomalies: Secondary | ICD-10-CM

## 2020-12-26 DIAGNOSIS — O99891 Other specified diseases and conditions complicating pregnancy: Secondary | ICD-10-CM

## 2020-12-26 DIAGNOSIS — Z3482 Encounter for supervision of other normal pregnancy, second trimester: Secondary | ICD-10-CM | POA: Insufficient documentation

## 2020-12-26 DIAGNOSIS — N898 Other specified noninflammatory disorders of vagina: Secondary | ICD-10-CM

## 2020-12-26 DIAGNOSIS — O26892 Other specified pregnancy related conditions, second trimester: Secondary | ICD-10-CM | POA: Insufficient documentation

## 2020-12-26 DIAGNOSIS — Z87440 Personal history of urinary (tract) infections: Secondary | ICD-10-CM

## 2020-12-26 DIAGNOSIS — Z363 Encounter for antenatal screening for malformations: Secondary | ICD-10-CM

## 2020-12-26 DIAGNOSIS — Z3480 Encounter for supervision of other normal pregnancy, unspecified trimester: Secondary | ICD-10-CM

## 2020-12-26 DIAGNOSIS — R8271 Bacteriuria: Secondary | ICD-10-CM

## 2020-12-26 LAB — OB RESULTS CONSOLE GC/CHLAMYDIA: Gonorrhea: NEGATIVE

## 2020-12-26 NOTE — Progress Notes (Addendum)
LOW-RISK PREGNANCY VISIT Patient name: Samantha Khan MRN [id number]  Date of birth: 1990/07/22 Chief Complaint:   Routine Prenatal Visit (2nd IT today; green vaginal discharge)  History of Present Illness:   Samantha Khan is a 30 y.o. [phone] female at [redacted]w[redacted]d with an Estimated Date of Delivery: 05/27/21 being seen today for ongoing management of a low-risk pregnancy.   Today she reports vaginal discharge-green x 1wk, no itching/irritation/odor. Missed last appt, wasn't scheduled for anatomy u/s today. Contractions: Not present. Vag. Bleeding: None.  Movement: Present. denies leaking of fluid.  Depression screen [facility] 2/9 11/15/2020 07/21/2017  Decreased Interest 2 0  Down, Depressed, Hopeless 1 0  PHQ - 2 Score 3 0  Altered sleeping 2 0  Tired, decreased energy 2 1  Change in appetite 0 0  Feeling bad or failure about yourself  0 0  Trouble concentrating 0 0  Moving slowly or fidgety/restless 0 0  Suicidal thoughts 0 0  PHQ-9 Score 7 1     GAD 7 : Generalized Anxiety Score 11/15/2020  Nervous, Anxious, on Edge 2  Control/stop worrying 1  Worry too much - different things 2  Trouble relaxing 2  Restless 2  Easily annoyed or irritable 2  Afraid - awful might happen 0  Total GAD 7 Score 11      Review of Systems:   Pertinent items are noted in HPI Denies abnormal vaginal discharge w/ itching/odor/irritation, headaches, visual changes, shortness of breath, chest pain, abdominal pain, severe nausea/vomiting, or problems with urination or bowel movements unless otherwise stated above. Pertinent History Reviewed:  Reviewed past medical,surgical, social, obstetrical and family history.  Reviewed problem list, medications and allergies. Physical Assessment:   Vitals:   12/26/20 1501  BP: (!) 95/57  Pulse: 77  Weight: 170 lb (77.1 kg)  Body mass index is 34.34 kg/m.        Physical Examination:   General appearance: Well appearing, and in no distress  Mental status:  Alert, oriented to person, place, and time  Skin: Warm & dry  Cardiovascular: Normal heart rate noted  Respiratory: Normal respiratory effort, no distress  Abdomen: Soft, gravid, nontender  Pelvic:  spec exam: cx visually closed, small amt d/c, CV swab          Extremities: Edema: None  Fetal Status: Fetal Heart Rate (bpm): 147   Movement: Present    Chaperone: Faith Rogue   No results found for this or any previous visit (from the past 24 hour(s)).  Assessment & Plan:  1) Low-risk pregnancy [id number] at [redacted]w[redacted]d with an Estimated Date of Delivery: 05/27/21   2) Vaginal d/c, CV swab  3) ASB 1st trimester> urine cx poc today   Meds: No orders of the defined types were placed in this encounter.  Labs/procedures today: spec exam and CV swab, 2nd IT  Plan:  Continue routine obstetrical care  Next visit: prefers will be in person for u/s     Reviewed: Preterm labor symptoms and general obstetric precautions including but not limited to vaginal bleeding, contractions, leaking of fluid and fetal movement were reviewed in detail with the patient.  All questions were answered. Does have home bp cuff. Office bp cuff given: not applicable. Check bp weekly, let us know if consistently >140 and/or >90.  Follow-up: Return for ASAP, ZM:OQHUTML (no visit), then 4wks LROB w/CNM in person.  Future Appointments  Date Time Provider Department Center  03/06/2021  9:00 AM Jacquelin Hawking, PA-C [facility] None  Orders Placed This Encounter  Procedures   Urine Culture   US OB Comp + 14 Wk   INTEGRATED 2   Cheral Marker CNM, [facility] 12/26/2020 3:22 PM

## 2020-12-26 NOTE — Progress Notes (Signed)
Green vaginal discharge 

## 2020-12-26 NOTE — Patient Instructions (Signed)
Samantha Khan, thank you for choosing our office today! We appreciate the opportunity to meet your healthcare needs. You may receive a short survey by mail, e-mail, or through [employer]. If you are happy with your care we would appreciate if you could take just a few minutes to complete the survey questions. We read all of your comments and take your feedback very seriously. Thank you again for choosing our office.  Center for [employer] Team at [facility] at [facility] ([address] [place], [place] [address]) Entrance C, located off of [address] Free 24/7 valet parking  Go to [email or web address] to register for FREE online childbirth classes  Call the office [phone]) or go to [facility] if: You begin to severe cramping Your water breaks.  Sometimes it is a big gush of fluid, sometimes it is just a trickle that keeps getting your panties wet or running down your legs You have vaginal bleeding.  It is normal to have a small amount of spotting if your cervix was checked.   [facility] Pediatricians/Family Doctors [facility]): [address] Colette Ribas, [phone]           [facility]: [address], [phone]                [facility]): [address], [phone] (call to ask if accepting patients) [facility] Department: [address], [place], [phone]    [facility] Pediatricians/Family Doctors [facility]): [phone] [address], [phone] [facility]: [address] [place], [phone] [facility]: [address], [phone]  [facility] Doctors  [facility]): [phone] [facility]: [address] [phone]   [facility] Doctors [facility]: [address] [phone]  [facility] Doctors  [facility]: [phone], [phone]  Home Blood Pressure Monitoring for Patients   Your provider has recommended that you check your blood pressure (BP) at least once a week at home. If you do not have a blood pressure cuff at home, one will be provided for you. Contact your provider if you have not received your monitor within 1 week.   Helpful Tips for Accurate Home Blood Pressure Checks  Don't smoke, exercise, or drink caffeine 30 minutes before checking your BP Use the restroom before checking your BP (a full bladder can raise your pressure) Relax in a comfortable upright chair Feet on the ground Left arm resting comfortably on a flat surface at the level of your heart Legs uncrossed Back supported Sit quietly and don't talk Place the cuff on your bare arm Adjust snuggly, so that only two fingertips can fit between your skin and the top of the cuff Check 2 readings separated by at least one minute Keep a log of your BP readings For a visual, please reference this diagram: http://ccnc.care/bpdiagram  Provider Name: [facility]     Phone: [phone]  Zone 1: ALL CLEAR  Continue to monitor your symptoms:  BP reading is less than 140 (top number) or less than 90 (bottom number)  No right upper stomach pain No headaches or seeing spots No feeling nauseated or throwing up No swelling in face and hands  Zone 2: CAUTION Call your doctor's office for any of the following:  BP reading is greater than 140 (top number) or greater than  90 (bottom number)  Stomach pain under your ribs in the middle or right side Headaches or seeing spots Feeling nauseated or throwing up Swelling in face and hands  Zone 3: EMERGENCY  Seek immediate medical care if you have any of the following:  BP reading is greater than160 (top number) or greater than 110 (bottom number) Severe headaches not improving with Tylenol Serious difficulty catching your breath Any worsening symptoms from  Zone 2     Second Trimester of Pregnancy The second trimester is from week 14 through week 27 (months 4 through 6). The second trimester is often a time when you feel your best. Your body has adjusted to being pregnant, and you begin to feel better physically. Usually, morning sickness has lessened or quit completely, you may have more energy, and you may have an increase in appetite. The second trimester is also a time when the fetus is growing rapidly. At the end of the sixth month, the fetus is about 9 inches long and weighs about 1 pounds. You will likely begin to feel the baby move (quickening) between 16 and 20 weeks of pregnancy. Body changes during your second trimester Your body continues to go through many changes during your second trimester. The changes vary from woman to woman. Your weight will continue to increase. You will notice your lower abdomen bulging out. You may begin to get stretch marks on your hips, abdomen, and breasts. You may develop headaches that can be relieved by medicines. The medicines should be approved by your health care provider. You may urinate more often because the fetus is pressing on your bladder. You may develop or continue to have heartburn as a result of your pregnancy. You may develop constipation because certain hormones are causing the muscles that push waste through your intestines to slow down. You may develop hemorrhoids or swollen, bulging veins (varicose veins). You may have back pain. This is caused by: Weight gain. Pregnancy hormones that are relaxing the joints in your pelvis. A shift in weight and the muscles that support your balance. Your breasts will continue to grow and they will continue to become tender. Your gums may bleed and may be sensitive to brushing and flossing. Dark spots or blotches (chloasma, mask of pregnancy) may develop on your face. This will likely fade after the baby is born. A dark line from your belly button to  the pubic area (linea nigra) may appear. This will likely fade after the baby is born. You may have changes in your hair. These can include thickening of your hair, rapid growth, and changes in texture. Some women also have hair loss during or after pregnancy, or hair that feels dry or thin. Your hair will most likely return to normal after your baby is born.  What to expect at prenatal visits During a routine prenatal visit: You will be weighed to make sure you and the fetus are growing normally. Your blood pressure will be taken. Your abdomen will be measured to track your baby's growth. The fetal heartbeat will be listened to. Any test results from the previous visit will be discussed.  Your health care provider may ask you: How you are feeling. If you are feeling the baby move. If you have had any abnormal symptoms, such as leaking fluid, bleeding, severe headaches, or abdominal cramping. If you are using any tobacco products, including cigarettes, chewing tobacco, and electronic cigarettes. If you have any questions.  Other tests that may be performed during   your second trimester include: Blood tests that check for: Low iron levels (anemia). High blood sugar that affects pregnant women (gestational diabetes) between 24 and 28 weeks. Rh antibodies. This is to check for a protein on red blood cells (Rh factor). Urine tests to check for infections, diabetes, or protein in the urine. An ultrasound to confirm the proper growth and development of the baby. An amniocentesis to check for possible genetic problems. Fetal screens for spina bifida and Down syndrome. HIV (human immunodeficiency virus) testing. Routine prenatal testing includes screening for HIV, unless you choose not to have this test.  Follow these instructions at home: Medicines Follow your health care provider's instructions regarding medicine use. Specific medicines may be either safe or unsafe to take during  pregnancy. Take a prenatal vitamin that contains at least 600 micrograms (mcg) of folic acid. If you develop constipation, try taking a stool softener if your health care provider approves. Eating and drinking Eat a balanced diet that includes fresh fruits and vegetables, whole grains, good sources of protein such as meat, eggs, or tofu, and low-fat dairy. Your health care provider will help you determine the amount of weight gain that is right for you. Avoid raw meat and uncooked cheese. These carry germs that can cause birth defects in the baby. If you have low calcium intake from food, talk to your health care provider about whether you should take a daily calcium supplement. Limit foods that are high in fat and processed sugars, such as fried and sweet foods. To prevent constipation: Drink enough fluid to keep your urine clear or pale yellow. Eat foods that are high in fiber, such as fresh fruits and vegetables, whole grains, and beans. Activity Exercise only as directed by your health care provider. Most women can continue their usual exercise routine during pregnancy. Try to exercise for 30 minutes at least 5 days a week. Stop exercising if you experience uterine contractions. Avoid heavy lifting, wear low heel shoes, and practice good posture. A sexual relationship may be continued unless your health care provider directs you otherwise. Relieving pain and discomfort Wear a good support bra to prevent discomfort from breast tenderness. Take warm sitz baths to soothe any pain or discomfort caused by hemorrhoids. Use hemorrhoid cream if your health care provider approves. Rest with your legs elevated if you have leg cramps or low back pain. If you develop varicose veins, wear support hose. Elevate your feet for 15 minutes, 3-4 times a day. Limit salt in your diet. Prenatal Care Write down your questions. Take them to your prenatal visits. Keep all your prenatal visits as told by your health  care provider. This is important. Safety Wear your seat belt at all times when driving. Make a list of emergency phone numbers, including numbers for family, friends, the hospital, and police and fire departments. General instructions Ask your health care provider for a referral to a local prenatal education class. Begin classes no later than the beginning of month 6 of your pregnancy. Ask for help if you have counseling or nutritional needs during pregnancy. Your health care provider can offer advice or refer you to specialists for help with various needs. Do not use hot tubs, steam rooms, or saunas. Do not douche or use tampons or scented sanitary pads. Do not cross your legs for long periods of time. Avoid cat litter boxes and soil used by cats. These carry germs that can cause birth defects in the baby and possibly loss of the   fetus by miscarriage or stillbirth. Avoid all smoking, herbs, alcohol, and unprescribed drugs. Chemicals in these products can affect the formation and growth of the baby. Do not use any products that contain nicotine or tobacco, such as cigarettes and e-cigarettes. If you need help quitting, ask your health care provider. Visit your dentist if you have not gone yet during your pregnancy. Use a soft toothbrush to brush your teeth and be gentle when you floss. Contact a health care provider if: You have dizziness. You have mild pelvic cramps, pelvic pressure, or nagging pain in the abdominal area. You have persistent nausea, vomiting, or diarrhea. You have a bad smelling vaginal discharge. You have pain when you urinate. Get help right away if: You have a fever. You are leaking fluid from your vagina. You have spotting or bleeding from your vagina. You have severe abdominal cramping or pain. You have rapid weight gain or weight loss. You have shortness of breath with chest pain. You notice sudden or extreme swelling of your face, hands, ankles, feet, or legs. You  have not felt your baby move in over an hour. You have severe headaches that do not go away when you take medicine. You have vision changes. Summary The second trimester is from week 14 through week 27 (months 4 through 6). It is also a time when the fetus is growing rapidly. Your body goes through many changes during pregnancy. The changes vary from woman to woman. Avoid all smoking, herbs, alcohol, and unprescribed drugs. These chemicals affect the formation and growth your baby. Do not use any tobacco products, such as cigarettes, chewing tobacco, and e-cigarettes. If you need help quitting, ask your health care provider. Contact your health care provider if you have any questions. Keep all prenatal visits as told by your health care provider. This is important. This information is not intended to replace advice given to you by your health care provider. Make sure you discuss any questions you have with your health care provider. Document Released: 06/17/2001 Document Revised: 11/29/2015 Document Reviewed: 08/24/2012 Elsevier Interactive Patient Education  2017 [employer] Inc.  

## 2020-12-27 LAB — CERVICOVAGINAL ANCILLARY ONLY
Bacterial Vaginitis (gardnerella): POSITIVE — AB
Chlamydia: NEGATIVE
Comment: NEGATIVE
Comment: NEGATIVE
Comment: NEGATIVE
Comment: NORMAL
Neisseria Gonorrhea: NEGATIVE
Trichomonas: NEGATIVE

## 2020-12-28 ENCOUNTER — Telehealth: Payer: Self-pay

## 2020-12-28 LAB — INTEGRATED 2
AFP MoM: 0.66
Alpha-Fetoprotein: 28 ng/mL
Crown Rump Length: 65.3 mm
DIA MoM: 1.36
DIA Value: 198.8 pg/mL
Estriol, Unconjugated: 1.92 ng/mL
Gest. Age on Collection Date: 12.7 weeks
Gestational Age: 18.6 weeks
Maternal Age at EDD: 31.7 yr
Nuchal Translucency (NT): 1.4 mm
Nuchal Translucency MoM: 0.96
Number of Fetuses: 1
PAPP-A MoM: 0.85
PAPP-A Value: 800.4 ng/mL
Test Results:: NEGATIVE
Weight: 167 [lb_av]
Weight: 170 [lb_av]
hCG MoM: 2.35
hCG Value: 54.7 IU/mL
uE3 MoM: 1.27

## 2020-12-28 MED ORDER — METRONIDAZOLE 500 MG PO TABS: 500.0000 mg | ORAL_TABLET | Freq: Two times a day (BID) | ORAL | 0 refills | Status: DC

## 2020-12-28 NOTE — Addendum Note (Signed)
Addended by: Cheral Marker on: 12/28/2020 08:48 AM   Modules accepted: Orders

## 2020-12-28 NOTE — Telephone Encounter (Signed)
Tc from patient in regards to pharmacy, just wants to make sure all medcines are being sent to [facility] in [place].

## 2020-12-28 NOTE — Telephone Encounter (Signed)
Pt wants Flagyl sent to [facility] in [place]. I called in Flagyl 500 mg tab #14 take 1 tab by mouth BID with 0 refills per Selena Batten B. Flagyl cancelled at [facility] pharmacy. JSY

## 2020-12-29 LAB — URINE CULTURE

## 2020-12-31 ENCOUNTER — Ambulatory Visit: Payer: Self-pay

## 2020-12-31 ENCOUNTER — Other Ambulatory Visit: Payer: Self-pay

## 2020-12-31 DIAGNOSIS — Z363 Encounter for antenatal screening for malformations: Secondary | ICD-10-CM

## 2020-12-31 DIAGNOSIS — Z3480 Encounter for supervision of other normal pregnancy, unspecified trimester: Secondary | ICD-10-CM

## 2020-12-31 DIAGNOSIS — Z3482 Encounter for supervision of other normal pregnancy, second trimester: Secondary | ICD-10-CM

## 2020-12-31 DIAGNOSIS — Z3A19 19 weeks gestation of pregnancy: Secondary | ICD-10-CM

## 2020-12-31 NOTE — Progress Notes (Signed)
[place] 19 wks,cephalic,cx 4 cm,fundal placenta gr 0,normal ovaries,SVP of fluid 4.5 cm,FHR 150 BPM,EFW 298 g 75%,anatomy complete,no obvious abnormalities

## 2021-01-23 ENCOUNTER — Ambulatory Visit: Payer: Self-pay | Admitting: Advanced Practice Midwife

## 2021-01-23 ENCOUNTER — Other Ambulatory Visit: Payer: Self-pay

## 2021-01-23 VITALS — BP 105/68 | HR 90 | Wt 171.0 lb

## 2021-01-23 DIAGNOSIS — Z3480 Encounter for supervision of other normal pregnancy, unspecified trimester: Secondary | ICD-10-CM

## 2021-01-23 DIAGNOSIS — Z3A22 22 weeks gestation of pregnancy: Secondary | ICD-10-CM

## 2021-01-23 NOTE — Progress Notes (Signed)
   LOW-RISK PREGNANCY VISIT- with Spanish interpreter Patient name: Samantha Khan MRN [id number]  Date of birth: [birth year] Chief Complaint:   Routine Prenatal Visit  History of Present Illness:   Samantha Khan is a 30 y.o. [id number] female at [redacted]w[redacted]d with an Estimated Date of Delivery: 05/27/21 being seen today for ongoing management of a low-risk pregnancy.  Today she reports no complaints. Contractions: Not present. Vag. Bleeding: None.  Movement: Present. denies leaking of fluid. Review of Systems:   Pertinent items are noted in HPI Denies abnormal vaginal discharge w/ itching/odor/irritation, headaches, visual changes, shortness of breath, chest pain, abdominal pain, severe nausea/vomiting, or problems with urination or bowel movements unless otherwise stated above. Pertinent History Reviewed:  Reviewed past medical,surgical, social, obstetrical and family history.  Reviewed problem list, medications and allergies. Physical Assessment:   Vitals:   01/23/21 1607  BP: 105/68  Pulse: 90  Weight: 171 lb (77.6 kg)  Body mass index is 34.54 kg/m.        Physical Examination:   General appearance: Well appearing, and in no distress  Mental status: Alert, oriented to person, place, and time  Skin: Warm & dry  Cardiovascular: Normal heart rate noted  Respiratory: Normal respiratory effort, no distress  Abdomen: Soft, gravid, nontender  Pelvic: Cervical exam deferred         Extremities: Edema: None  Fetal Status: Fetal Heart Rate (bpm): 159   Movement: Present    No results found for this or any previous visit (from the past 24 hour(s)).  Assessment & Plan:  1) Low-risk pregnancy [id number] at [redacted]w[redacted]d with an Estimated Date of Delivery: 05/27/21   2) Anemia, Hgb 10.7>Fe> recheck w PN2   Meds: No orders of the defined types were placed in this encounter.  Labs/procedures today: none  Plan:  Continue routine obstetrical care   Reviewed: Preterm labor symptoms and general obstetric  precautions including but not limited to vaginal bleeding, contractions, leaking of fluid and fetal movement were reviewed in detail with the patient.  All questions were answered. Has home bp cuff. Check bp weekly, let us know if >140/90.   Follow-up: Return in about 4 weeks (around 02/20/2021) for LROB, PN2, in person.  No orders of the defined types were placed in this encounter.  Arabella Merles CNM 01/23/2021 4:29 PM

## 2021-01-23 NOTE — Patient Instructions (Signed)
Rayetta Pigg, I greatly value your feedback.  If you receive a survey following your visit with [place] today, we appreciate you taking the time to fill it out.  Thanks, Philipp Deputy, CNM   You will have your sugar test next visit.  Please do not eat or drink anything after midnight the night before you come, not even water.  You will be here for at least two hours.  Please make an appointment online for the bloodwork at [email or web address] for 8:30am (or as close to this as possible). Make sure you select the [facility] service center. The day of the appointment, check in with our office first, then you will go to [facility] to start the sugar test.    [facility] HAS MOVED!!! It is now [facility] ([address] [place], [place] [address]) Entrance C, located off of [address] valet parking  Go to [email or web address] to register for FREE online childbirth classes   Call the office [phone]) or go to [facility] if: You begin to have strong, frequent contractions Your water breaks.  Sometimes it is a big gush of fluid, sometimes it is just a trickle that keeps getting your panties wet or running down your legs You have vaginal bleeding.  It is normal to have a small amount of spotting if your cervix was checked.  You don't feel your baby moving like normal.  If you don't, get you something to eat and drink and lay down and focus on feeling your baby move.   If your baby is still not moving like normal, you should call the office or go to [facility].  [place] Pediatricians/Family Doctors: [facility] [phone]           [facility] [phone]                [facility] [phone] (usually not accepting new patients unless you have family there already, you are always welcome to call and ask)      [facility] Department [phone]       [facility] Pediatricians/Family Doctors:  [facility]: [phone] [facility]: [phone] [facility]: [phone]  [facility] Doctors:  [facility]: [phone]  [facility]: [phone]  [facility] Doctors: [facility]: [phone]   Home Blood Pressure Monitoring for Patients   Your provider has recommended that you check your blood pressure (BP) at least once a week at home. If you do not have a blood pressure cuff at home, one will be provided for you. Contact your provider if you have not received your monitor within 1 week.   Helpful Tips for Accurate Home Blood Pressure Checks  Don't smoke, exercise, or drink caffeine 30 minutes before checking your BP Use the restroom before checking your BP (a full bladder can raise your pressure) Relax in a comfortable upright chair Feet on the ground Left arm resting comfortably on a flat surface at the level of your heart Legs uncrossed Back supported Sit quietly and don't talk Place the cuff on your bare arm Adjust snuggly, so that only two fingertips can fit between your skin and the top of the cuff Check 2 readings separated by at least one minute Keep a log of your BP readings For a visual, please reference this diagram: http://ccnc.care/bpdiagram  Provider Name: [facility]     Phone: [phone]  Zone 1: ALL CLEAR  Continue to monitor your symptoms:  BP reading is less than 140 (top number) or less than 90 (bottom number)  No right upper stomach pain No headaches or seeing spots No feeling nauseated or throwing up No swelling in face and hands  Zone 2: CAUTION Call your doctor's office for any of the following:  BP reading is greater than 140 (top number) or greater than 90 (bottom number)  Stomach pain under your ribs in the middle or right side Headaches or seeing spots Feeling nauseated or throwing up Swelling in face and hands  Zone 3: EMERGENCY  Seek  immediate medical care if you have any of the following:  BP reading is greater than160 (top number) or greater than 110 (bottom number) Severe headaches not improving with Tylenol Serious difficulty catching your breath Any worsening symptoms from Zone 2   Second Trimester of Pregnancy The second trimester is from week 13 through week 28, months 4 through 6. The second trimester is often a time when you feel your best. Your body has also adjusted to being pregnant, and you begin to feel better physically. Usually, morning sickness has lessened or quit completely, you may have more energy, and you may have an increase in appetite. The second trimester is also a time when the fetus is growing rapidly. At the end of the sixth month, the fetus is about 9 inches long and weighs about 1 pounds. You will likely begin to feel the baby move (quickening) between 18 and 20 weeks of the pregnancy. BODY CHANGES Your body goes through many changes during pregnancy. The changes vary from woman to woman.  Your weight will continue to increase. You will notice your lower abdomen bulging out. You may begin to get stretch marks on your hips, abdomen, and breasts. You may develop headaches that can be relieved by medicines approved by your health care provider. You may urinate more often because the fetus is pressing on your bladder. You may develop or continue to have heartburn as a result of your pregnancy. You may develop constipation because certain hormones are causing the muscles that push waste through your intestines to slow down. You may develop hemorrhoids or swollen, bulging veins (varicose veins). You may have back pain because of the weight gain and pregnancy hormones relaxing your joints between the bones in your pelvis and as a result of a shift in weight and the muscles that support your balance. Your breasts will continue to grow and be tender. Your gums may bleed and may be sensitive to brushing  and flossing. Dark spots or blotches (chloasma, mask of pregnancy) may develop on your face. This will likely fade after the baby is born. A dark line from your belly button to the pubic area (linea nigra) may appear. This will likely fade after the baby is born. You may have changes in your hair. These can include thickening of your hair, rapid growth, and changes in texture. Some women also have hair loss during or after pregnancy, or hair that feels dry or thin. Your hair will most likely return to normal after your baby is born. WHAT TO EXPECT AT YOUR PRENATAL VISITS During a routine prenatal visit: You will be weighed to make sure you and the fetus are growing normally. Your blood pressure will be taken. Your abdomen will be measured to track your baby's growth. The fetal heartbeat will be listened to. Any test results from the previous visit will be discussed. Your health care provider  may ask you: How you are feeling. If you are feeling the baby move. If you have had any abnormal symptoms, such as leaking fluid, bleeding, severe headaches, or abdominal cramping. If you have any questions. Other tests that may be performed during your second trimester include: Blood tests that check for: Low iron levels (anemia). Gestational diabetes (between 24 and 28 weeks). Rh antibodies. Urine tests to check for infections, diabetes, or protein in the urine. An ultrasound to confirm the proper growth and development of the baby. An amniocentesis to check for possible genetic problems. Fetal screens for spina bifida and Down syndrome. HOME CARE INSTRUCTIONS  Avoid all smoking, herbs, alcohol, and unprescribed drugs. These chemicals affect the formation and growth of the baby. Follow your health care provider's instructions regarding medicine use. There are medicines that are either safe or unsafe to take during pregnancy. Exercise only as directed by your health care provider. Experiencing  uterine cramps is a good sign to stop exercising. Continue to eat regular, healthy meals. Wear a good support bra for breast tenderness. Do not use hot tubs, steam rooms, or saunas. Wear your seat belt at all times when driving. Avoid raw meat, uncooked cheese, cat litter boxes, and soil used by cats. These carry germs that can cause birth defects in the baby. Take your prenatal vitamins. Try taking a stool softener (if your health care provider approves) if you develop constipation. Eat more high-fiber foods, such as fresh vegetables or fruit and whole grains. Drink plenty of fluids to keep your urine clear or pale yellow. Take warm sitz baths to soothe any pain or discomfort caused by hemorrhoids. Use hemorrhoid cream if your health care provider approves. If you develop varicose veins, wear support hose. Elevate your feet for 15 minutes, 3-4 times a day. Limit salt in your diet. Avoid heavy lifting, wear low heel shoes, and practice good posture. Rest with your legs elevated if you have leg cramps or low back pain. Visit your dentist if you have not gone yet during your pregnancy. Use a soft toothbrush to brush your teeth and be gentle when you floss. A sexual relationship may be continued unless your health care provider directs you otherwise. Continue to go to all your prenatal visits as directed by your health care provider. SEEK MEDICAL CARE IF:  You have dizziness. You have mild pelvic cramps, pelvic pressure, or nagging pain in the abdominal area. You have persistent nausea, vomiting, or diarrhea. You have a bad smelling vaginal discharge. You have pain with urination. SEEK IMMEDIATE MEDICAL CARE IF:  You have a fever. You are leaking fluid from your vagina. You have spotting or bleeding from your vagina. You have severe abdominal cramping or pain. You have rapid weight gain or loss. You have shortness of breath with chest pain. You notice sudden or extreme swelling of your face,  hands, ankles, feet, or legs. You have not felt your baby move in over an hour. You have severe headaches that do not go away with medicine. You have vision changes. Document Released: 06/17/2001 Document Revised: 06/28/2013 Document Reviewed: 08/24/2012 [facility] Patient Information 2015 [place], [place]. This information is not intended to replace advice given to you by your health care provider. Make sure you discuss any questions you have with your health care provider.

## 2021-02-21 ENCOUNTER — Other Ambulatory Visit: Payer: Self-pay

## 2021-02-21 ENCOUNTER — Encounter: Payer: Self-pay | Admitting: Obstetrics & Gynecology

## 2021-02-21 ENCOUNTER — Ambulatory Visit: Payer: Self-pay | Admitting: Obstetrics & Gynecology

## 2021-02-21 VITALS — BP 89/48 | HR 83 | Wt 174.2 lb

## 2021-02-21 DIAGNOSIS — Z23 Encounter for immunization: Secondary | ICD-10-CM

## 2021-02-21 DIAGNOSIS — Z3482 Encounter for supervision of other normal pregnancy, second trimester: Secondary | ICD-10-CM

## 2021-02-21 DIAGNOSIS — Z3A26 26 weeks gestation of pregnancy: Secondary | ICD-10-CM

## 2021-02-21 NOTE — Progress Notes (Signed)
   LOW-RISK PREGNANCY VISIT Patient name: Samantha Khan MRN [id number]  Date of birth: 13-Jan-1991 Chief Complaint:   Routine Prenatal Visit  History of Present Illness:   Samantha Khan is a 30 y.o. [id number] female at [redacted]w[redacted]d with an Estimated Date of Delivery: 05/27/21 being seen today for ongoing management of a low-risk pregnancy.   Preg c/b -bicornuate uterus -anemia- on oral iron  Depression screen [facility] 2/9 11/15/2020 07/21/2017  Decreased Interest 2 0  Down, Depressed, Hopeless 1 0  PHQ - 2 Score 3 0  Altered sleeping 2 0  Tired, decreased energy 2 1  Change in appetite 0 0  Feeling bad or failure about yourself  0 0  Trouble concentrating 0 0  Moving slowly or fidgety/restless 0 0  Suicidal thoughts 0 0  PHQ-9 Score 7 1    Today she reports backache. Contractions: Not present. Vag. Bleeding: None.  Movement: Present. denies leaking of fluid. Review of Systems:   Pertinent items are noted in HPI Denies abnormal vaginal discharge w/ itching/odor/irritation, headaches, visual changes, shortness of breath, chest pain, abdominal pain, severe nausea/vomiting, or problems with urination or bowel movements unless otherwise stated above. Pertinent History Reviewed:  Reviewed past medical,surgical, social, obstetrical and family history.  Reviewed problem list, medications and allergies.  Physical Assessment:   Vitals:   02/21/21 0856  BP: (!) 89/48  Pulse: 83  Weight: 174 lb 3.2 oz (79 kg)  Body mass index is 35.18 kg/m.        Physical Examination:   General appearance: Well appearing, and in no distress  Mental status: Alert, oriented to person, place, and time  Skin: Warm & dry  Respiratory: Normal respiratory effort, no distress  Abdomen: Soft, gravid, nontender  Pelvic: Cervical exam deferred         Extremities: Edema: None  Psych:  mood and affect appropriate  Fetal Status: Fetal Heart Rate (bpm): 135 Fundal Height: 29 cm Movement: Present    Chaperone: n/a     No results found for this or any previous visit (from the past 24 hour(s)).   Assessment & Plan:  1) Low-risk pregnancy [id number] at [redacted]w[redacted]d with an Estimated Date of Delivery: 05/27/21   2) Uterine size discrepancy- monitor fundal height, []  consider growth scan in 3rd trimester   Meds: No orders of the defined types were placed in this encounter.  Labs/procedures today: PN2 and Tdap today  Plan:  Continue routine obstetrical care Next visit: prefers in person    Reviewed: Preterm labor symptoms and general obstetric precautions including but not limited to vaginal bleeding, contractions, leaking of fluid and fetal movement were reviewed in detail with the patient.  All questions were answered. Pt has home bp cuff. Check bp weekly, let know if >140/90.   Follow-up: Return in about 4 weeks (around 03/21/2021) for LROB visit.  Orders Placed This Encounter  Procedures   Tdap vaccine greater than or equal to 7yo IM    03/23/2021, DO Attending Obstetrician & Gynecologist, [facility] for [facility]

## 2021-02-22 LAB — HIV ANTIBODY (ROUTINE TESTING W REFLEX): HIV Screen 4th Generation wRfx: NONREACTIVE

## 2021-02-22 LAB — CBC
Hematocrit: 33.4 % — ABNORMAL LOW (ref 34.0–46.6)
Hemoglobin: 11.3 g/dL (ref 11.1–15.9)
MCH: 29.2 pg (ref 26.6–33.0)
MCHC: 33.8 g/dL (ref 31.5–35.7)
MCV: 86 fL (ref 79–97)
Platelets: 267 10*3/uL (ref 150–450)
RBC: 3.87 x10E6/uL (ref 3.77–5.28)
RDW: 19.6 % — ABNORMAL HIGH (ref 11.7–15.4)
WBC: 7.8 10*3/uL (ref 3.4–10.8)

## 2021-02-22 LAB — GLUCOSE TOLERANCE, 2 HOURS W/ 1HR
Glucose, 1 hour: 140 mg/dL (ref 65–179)
Glucose, 2 hour: 99 mg/dL (ref 65–152)
Glucose, Fasting: 88 mg/dL (ref 65–91)

## 2021-02-22 LAB — RPR: RPR Ser Ql: NONREACTIVE

## 2021-02-22 LAB — ANTIBODY SCREEN: Antibody Screen: NEGATIVE

## 2021-03-06 ENCOUNTER — Ambulatory Visit: Payer: Self-pay | Admitting: Physician Assistant

## 2021-03-25 ENCOUNTER — Encounter: Payer: Self-pay | Admitting: Women's Health

## 2021-03-29 ENCOUNTER — Telehealth: Payer: Self-pay

## 2021-03-29 NOTE — Telephone Encounter (Signed)
Pt called stating that she was having some vaginal pain, light bleeding with wiping due to irritation, and white discharge. Pt had used a one day yeast infection treatment before, but it did not cure it. Pt was instructed to take only a 7 day treatment, not a 1 or 3 day. Pt was asked if she wanted to come into the office for a nurse visit to do a self-swab. Pt wanted to try the 7 day treatment first. If no improvement by Monday, she will call the office to make an appt. Baby is moving well, pt denies contractions.

## 2021-04-10 ENCOUNTER — Other Ambulatory Visit: Payer: Self-pay

## 2021-04-10 ENCOUNTER — Ambulatory Visit (INDEPENDENT_AMBULATORY_CARE_PROVIDER_SITE_OTHER): Payer: Self-pay | Admitting: Advanced Practice Midwife

## 2021-04-10 VITALS — BP 91/50 | HR 82 | Wt 177.2 lb

## 2021-04-10 DIAGNOSIS — Z3A33 33 weeks gestation of pregnancy: Secondary | ICD-10-CM

## 2021-04-10 DIAGNOSIS — Z3480 Encounter for supervision of other normal pregnancy, unspecified trimester: Secondary | ICD-10-CM

## 2021-04-10 NOTE — Patient Instructions (Signed)
Paula Compton, thank you for choosing our office today! We appreciate the opportunity to meet your healthcare needs. You may receive a short survey by mail, e-mail, or through [employer]. If you are happy with your care we would appreciate if you could take just a few minutes to complete the survey questions. We read all of your comments and take your feedback very seriously. Thank you again for choosing our office.  Center for [employer] Team at [facility]  [facility] at [facility] ([address] [place], [place] [address]) Entrance C, located off of [address] Free 24/7 valet parking   CLASSES: Go to [email or web address] to register for classes (childbirth, breastfeeding, waterbirth, infant CPR, daddy bootcamp, etc.)  Call the office [phone]) or go to [facility] if: You begin to have strong, frequent contractions Your water breaks.  Sometimes it is a big gush of fluid, sometimes it is just a trickle that keeps getting your panties wet or running down your legs You have vaginal bleeding.  It is normal to have a small amount of spotting if your cervix was checked.  You don't feel your baby moving like normal.  If you don't, get you something to eat and drink and lay down and focus on feeling your baby move.   If your baby is still not moving like normal, you should call the office or go to [facility].  Call the office [phone]) or go to [facility] hospital for these signs of pre-eclampsia: Severe headache that does not go away with Tylenol Visual changes- seeing spots, double, blurred vision Pain under your right breast or upper abdomen that does not go away with Tums or heartburn medicine Nausea and/or vomiting Severe swelling in your hands, feet, and face   Tdap Vaccine It is recommended that you get the Tdap vaccine during the third trimester of EACH pregnancy to help protect your baby from getting pertussis (whooping cough) 27-36 weeks is the BEST time to do  this so that you can pass the protection on to your baby. During pregnancy is better than after pregnancy, but if you are unable to get it during pregnancy it will be offered at the hospital.  You can get this vaccine with [place], at the health department, your family doctor, or some local pharmacies Everyone who will be around your baby should also be up-to-date on their vaccines before the baby comes. Adults (who are not pregnant) only need 1 dose of Tdap during adulthood.   [facility] Pediatricians/Family Doctors [facility]): [address] Colette Ribas, [phone]           [facility]: [address], [phone]                [facility]): [address], [phone] (call to ask if accepting patients) [facility] Department: [address], [place], [phone]    [facility] Pediatricians/Family Doctors [facility]): [phone], [phone] [facility]: [address] [place], [phone] [facility]: [address], [phone]  [facility] Doctors  [facility]): [phone] [facility]: [address], [phone]   [facility] Doctors [facility]: [address], [phone]  [facility] Family Doctors  [facility]: [phone], [phone]  Home Blood Pressure Monitoring for Patients   Your provider has recommended that you check your  blood pressure (BP) at least once a week at home. If you do not have a blood pressure cuff at home, one will be provided for you. Contact your provider if you have not received your monitor within 1 week.   Helpful Tips for Accurate Home Blood Pressure Checks  Don't smoke, exercise, or drink caffeine 30 minutes before checking your BP Use the restroom before checking your BP (a full bladder can raise your  pressure) Relax in a comfortable upright chair Feet on the ground Left arm resting comfortably on a flat surface at the level of your heart Legs uncrossed Back supported Sit quietly and don't talk Place the cuff on your bare arm Adjust snuggly, so that only two fingertips can fit between your skin and the top of the cuff Check 2 readings separated by at least one minute Keep a log of your BP readings For a visual, please reference this diagram: http://ccnc.care/bpdiagram  Provider Name: [facility]     Phone: [phone]  Zone 1: ALL CLEAR  Continue to monitor your symptoms:  BP reading is less than 140 (top number) or less than 90 (bottom number)  No right upper stomach pain No headaches or seeing spots No feeling nauseated or throwing up No swelling in face and hands  Zone 2: CAUTION Call your doctor's office for any of the following:  BP reading is greater than 140 (top number) or greater than 90 (bottom number)  Stomach pain under your ribs in the middle or right side Headaches or seeing spots Feeling nauseated or throwing up Swelling in face and hands  Zone 3: EMERGENCY  Seek immediate medical care if you have any of the following:  BP reading is greater than160 (top number) or greater than 110 (bottom number) Severe headaches not improving with Tylenol Serious difficulty catching your breath Any worsening symptoms from Zone 2   Third Trimester of Pregnancy The third trimester is from week 29 through week 42, months 7 through 9. The third trimester is a time when the fetus is growing rapidly. At the end of the ninth month, the fetus is about 20 inches in length and weighs 6-10 pounds.  BODY CHANGES Your body goes through many changes during pregnancy. The changes vary from woman to woman.  Your weight will continue to increase. You can expect to gain 25-35 pounds (11-16 kg) by the end of the pregnancy. You may begin to get stretch marks on your hips, abdomen,  and breasts. You may urinate more often because the fetus is moving lower into your pelvis and pressing on your bladder. You may develop or continue to have heartburn as a result of your pregnancy. You may develop constipation because certain hormones are causing the muscles that push waste through your intestines to slow down. You may develop hemorrhoids or swollen, bulging veins (varicose veins). You may have pelvic pain because of the weight gain and pregnancy hormones relaxing your joints between the bones in your pelvis. Backaches may result from overexertion of the muscles supporting your posture. You may have changes in your hair. These can include thickening of your hair, rapid growth, and changes in texture. Some women also have hair loss during or after pregnancy, or hair that feels dry or thin. Your hair will most likely return to normal after your baby is born. Your breasts will continue to grow and be tender. A yellow discharge may leak from your breasts called colostrum. Your belly button may stick out. You may   feel short of breath because of your expanding uterus. You may notice the fetus "dropping," or moving lower in your abdomen. You may have a bloody mucus discharge. This usually occurs a few days to a week before labor begins. Your cervix becomes thin and soft (effaced) near your due date. WHAT TO EXPECT AT YOUR PRENATAL EXAMS  You will have prenatal exams every 2 weeks until week 36. Then, you will have weekly prenatal exams. During a routine prenatal visit: You will be weighed to make sure you and the fetus are growing normally. Your blood pressure is taken. Your abdomen will be measured to track your baby's growth. The fetal heartbeat will be listened to. Any test results from the previous visit will be discussed. You may have a cervical check near your due date to see if you have effaced. At around 36 weeks, your caregiver will check your cervix. At the same time, your  caregiver will also perform a test on the secretions of the vaginal tissue. This test is to determine if a type of bacteria, Group B streptococcus, is present. Your caregiver will explain this further. Your caregiver may ask you: What your birth plan is. How you are feeling. If you are feeling the baby move. If you have had any abnormal symptoms, such as leaking fluid, bleeding, severe headaches, or abdominal cramping. If you have any questions. Other tests or screenings that may be performed during your third trimester include: Blood tests that check for low iron levels (anemia). Fetal testing to check the health, activity level, and growth of the fetus. Testing is done if you have certain medical conditions or if there are problems during the pregnancy. FALSE LABOR You may feel small, irregular contractions that eventually go away. These are called Braxton Hicks contractions, or false labor. Contractions may last for hours, days, or even weeks before true labor sets in. If contractions come at regular intervals, intensify, or become painful, it is best to be seen by your caregiver.  SIGNS OF LABOR  Menstrual-like cramps. Contractions that are 5 minutes apart or less. Contractions that start on the top of the uterus and spread down to the lower abdomen and back. A sense of increased pelvic pressure or back pain. A watery or bloody mucus discharge that comes from the vagina. If you have any of these signs before the 37th week of pregnancy, call your caregiver right away. You need to go to the hospital to get checked immediately. HOME CARE INSTRUCTIONS  Avoid all smoking, herbs, alcohol, and unprescribed drugs. These chemicals affect the formation and growth of the baby. Follow your caregiver's instructions regarding medicine use. There are medicines that are either safe or unsafe to take during pregnancy. Exercise only as directed by your caregiver. Experiencing uterine cramps is a good sign to  stop exercising. Continue to eat regular, healthy meals. Wear a good support bra for breast tenderness. Do not use hot tubs, steam rooms, or saunas. Wear your seat belt at all times when driving. Avoid raw meat, uncooked cheese, cat litter boxes, and soil used by cats. These carry germs that can cause birth defects in the baby. Take your prenatal vitamins. Try taking a stool softener (if your caregiver approves) if you develop constipation. Eat more high-fiber foods, such as fresh vegetables or fruit and whole grains. Drink plenty of fluids to keep your urine clear or pale yellow. Take warm sitz baths to soothe any pain or discomfort caused by hemorrhoids. Use hemorrhoid cream if   your caregiver approves. If you develop varicose veins, wear support hose. Elevate your feet for 15 minutes, 3-4 times a day. Limit salt in your diet. Avoid heavy lifting, wear low heal shoes, and practice good posture. Rest a lot with your legs elevated if you have leg cramps or low back pain. Visit your dentist if you have not gone during your pregnancy. Use a soft toothbrush to brush your teeth and be gentle when you floss. A sexual relationship may be continued unless your caregiver directs you otherwise. Do not travel far distances unless it is absolutely necessary and only with the approval of your caregiver. Take prenatal classes to understand, practice, and ask questions about the labor and delivery. Make a trial run to the hospital. Pack your hospital bag. Prepare the baby's nursery. Continue to go to all your prenatal visits as directed by your caregiver. SEEK MEDICAL CARE IF: You are unsure if you are in labor or if your water has broken. You have dizziness. You have mild pelvic cramps, pelvic pressure, or nagging pain in your abdominal area. You have persistent nausea, vomiting, or diarrhea. You have a bad smelling vaginal discharge. You have pain with urination. SEEK IMMEDIATE MEDICAL CARE IF:  You  have a fever. You are leaking fluid from your vagina. You have spotting or bleeding from your vagina. You have severe abdominal cramping or pain. You have rapid weight loss or gain. You have shortness of breath with chest pain. You notice sudden or extreme swelling of your face, hands, ankles, feet, or legs. You have not felt your baby move in over an hour. You have severe headaches that do not go away with medicine. You have vision changes. Document Released: 06/17/2001 Document Revised: 06/28/2013 Document Reviewed: 08/24/2012 [facility] Patient Information 2015 [place], [place]. This information is not intended to replace advice given to you by your health care provider. Make sure you discuss any questions you have with your health care provider.

## 2021-04-10 NOTE — Progress Notes (Signed)
   LOW-RISK PREGNANCY VISIT Patient name: Samantha Khan MRN [id number]  Date of birth: [birth year] Chief Complaint:   Routine Prenatal Visit  History of Present Illness:   Samantha Khan is a 30 y.o. [id number] female at [redacted]w[redacted]d with an Estimated Date of Delivery: 05/27/21 being seen today for ongoing management of a low-risk pregnancy.  Today she reports  some swelling in vag area . Contractions: Irritability. Vag. Bleeding: None.  Movement: Present. denies leaking of fluid. Review of Systems:   Pertinent items are noted in HPI Denies abnormal vaginal discharge w/ itching/odor/irritation, headaches, visual changes, shortness of breath, chest pain, abdominal pain, severe nausea/vomiting, or problems with urination or bowel movements unless otherwise stated above. Pertinent History Reviewed:  Reviewed past medical,surgical, social, obstetrical and family history.  Reviewed problem list, medications and allergies. Physical Assessment:   Vitals:   04/10/21 0846  BP: (!) 91/50  Pulse: 82  Weight: 177 lb 3.2 oz (80.4 kg)  Body mass index is 35.79 kg/m.        Physical Examination:   General appearance: Well appearing, and in no distress  Mental status: Alert, oriented to person, place, and time  Skin: Warm & dry  Cardiovascular: Normal heart rate noted  Respiratory: Normal respiratory effort, no distress  Abdomen: Soft, gravid, nontender  Pelvic: Cervical exam deferred         Extremities: Edema: Trace  Fetal Status: Fetal Heart Rate (bpm): 138 Fundal Height: 34 cm Movement: Present    No results found for this or any previous visit (from the past 24 hour(s)).  Assessment & Plan:  1) Low-risk pregnancy [id number] at [redacted]w[redacted]d with an Estimated Date of Delivery: 05/27/21   2) Anemia, Hgb now 11.3 @ 26wks- continue qod Fe   Meds: No orders of the defined types were placed in this encounter.  Labs/procedures today: none  Plan:  Continue routine obstetrical care   Reviewed: Preterm labor  symptoms and general obstetric precautions including but not limited to vaginal bleeding, contractions, leaking of fluid and fetal movement were reviewed in detail with the patient.  All questions were answered. Has home bp cuff.  Check bp weekly, let us know if >140/90.   Follow-up: Return in 19 days (on 04/29/2021) for LROB, in person; GBS/cultures.  No orders of the defined types were placed in this encounter.  Arabella Merles CNM 04/10/2021 9:06 AM

## 2021-04-29 ENCOUNTER — Encounter: Payer: Self-pay | Admitting: Women's Health

## 2021-04-29 ENCOUNTER — Ambulatory Visit: Payer: Self-pay | Admitting: Women's Health

## 2021-04-29 ENCOUNTER — Other Ambulatory Visit: Payer: Self-pay

## 2021-04-29 VITALS — BP 87/53 | HR 84 | Wt 182.0 lb

## 2021-04-29 DIAGNOSIS — Z3480 Encounter for supervision of other normal pregnancy, unspecified trimester: Secondary | ICD-10-CM

## 2021-04-29 DIAGNOSIS — Z3483 Encounter for supervision of other normal pregnancy, third trimester: Secondary | ICD-10-CM

## 2021-04-29 DIAGNOSIS — Z3A36 36 weeks gestation of pregnancy: Secondary | ICD-10-CM

## 2021-04-29 NOTE — Progress Notes (Signed)
LOW-RISK PREGNANCY VISIT Patient name: Samantha Khan MRN [id number]  Date of birth: 1990/09/30 Chief Complaint:   Routine Prenatal Visit (GBS, GC/CHL; + pressure in abd and cramps)  History of Present Illness:   Samantha Khan is a 30 y.o. [phone] female at [redacted]w[redacted]d with an Estimated Date of Delivery: 05/27/21 being seen today for ongoing management of a low-risk pregnancy.   Today she reports cramping, low back pain, and pelvic pressure. Contractions: Irritability. Vag. Bleeding: None.  Movement: Present. denies leaking of fluid.  Depression screen [facility] 2/9 02/21/2021 11/15/2020 07/21/2017  Decreased Interest 2 2 0  Down, Depressed, Hopeless 1 1 0  PHQ - 2 Score 3 3 0  Altered sleeping 3 2 0  Tired, decreased energy 1 2 1   Change in appetite 0 0 0  Feeling bad or failure about yourself  0 0 0  Trouble concentrating 0 0 0  Moving slowly or fidgety/restless 0 0 0  Suicidal thoughts 0 0 0  PHQ-9 Score 7 7 1      GAD 7 : Generalized Anxiety Score 02/21/2021 11/15/2020  Nervous, Anxious, on Edge 0 2  Control/stop worrying 0 1  Worry too much - different things 0 2  Trouble relaxing 0 2  Restless 0 2  Easily annoyed or irritable 2 2  Afraid - awful might happen 1 0  Total GAD 7 Score 3 11      Review of Systems:   Pertinent items are noted in HPI Denies abnormal vaginal discharge w/ itching/odor/irritation, headaches, visual changes, shortness of breath, chest pain, abdominal pain, severe nausea/vomiting, or problems with urination or bowel movements unless otherwise stated above. Pertinent History Reviewed:  Reviewed past medical,surgical, social, obstetrical and family history.  Reviewed problem list, medications and allergies. Physical Assessment:   Vitals:   04/29/21 0910  BP: (!) 87/53  Pulse: 84  Weight: 182 lb (82.6 kg)  Body mass index is 36.76 kg/m.        Physical Examination:   General appearance: Well appearing, and in no distress  Mental status: Alert,  oriented to person, place, and time  Skin: Warm & dry  Cardiovascular: Normal heart rate noted  Respiratory: Normal respiratory effort, no distress  Abdomen: Soft, gravid, nontender  Pelvic: Cervical exam performed  Dilation: 1 Effacement (%): Thick Station: Ballotable  Extremities: Edema: Trace  Fetal Status: Fetal Heart Rate (bpm): 146 Fundal Height: 36 cm Movement: Present Presentation: Vertex  Chaperone: 01/15/2021   No results found for this or any previous visit (from the past 24 hour(s)).  Assessment & Plan:  1) Low-risk pregnancy 05/01/21 at [redacted]w[redacted]d with an Estimated Date of Delivery: 05/27/21   2) Low back pain, pressure, cramping, discussed   Meds: No orders of the defined types were placed in this encounter.  Labs/procedures today: GBS and SVE, declined gc/ct (self-pay and monogamous relationship), to check on flu shot price at check-out (can go to HD to get if needed)  Plan:  Continue routine obstetrical care  Next visit: prefers in person    Reviewed: Preterm labor symptoms and general obstetric precautions including but not limited to vaginal bleeding, contractions, leaking of fluid and fetal movement were reviewed in detail with the patient.  All questions were answered. Does have home bp cuff. Office bp cuff given: not applicable. Check bp weekly, let [redacted]w[redacted]d know if consistently >140 and/or >90.  Follow-up: Return for weekly, CNM, in person.  Future Appointments  Date Time Provider Department Center  07/16/2021  9:30  AM Jacquelin Hawking, PA-C [facility] None    Orders Placed This Encounter  Procedures   Strep Gp B NAA   Cheral Marker CNM, [facility] 04/29/2021 9:31 AM

## 2021-04-29 NOTE — Patient Instructions (Signed)
Samantha Khan, thank you for choosing our office today! We appreciate the opportunity to meet your healthcare needs. You may receive a short survey by mail, e-mail, or through [employer]. If you are happy with your care we would appreciate if you could take just a few minutes to complete the survey questions. We read all of your comments and take your feedback very seriously. Thank you again for choosing our office.  Center for [employer] Team at [facility]  [facility] at [facility] ([address] [place], [place] [address]) Entrance C, located off of [address] Free 24/7 valet parking   CLASSES: Go to [email or web address] to register for classes (childbirth, breastfeeding, waterbirth, infant CPR, daddy bootcamp, etc.)  Call the office [phone]) or go to [facility] if: You begin to have strong, frequent contractions Your water breaks.  Sometimes it is a big gush of fluid, sometimes it is just a trickle that keeps getting your panties wet or running down your legs You have vaginal bleeding.  It is normal to have a small amount of spotting if your cervix was checked.  You don't feel your baby moving like normal.  If you don't, get you something to eat and drink and lay down and focus on feeling your baby move.   If your baby is still not moving like normal, you should call the office or go to [facility].  Call the office [phone]) or go to [facility] hospital for these signs of pre-eclampsia: Severe headache that does not go away with Tylenol Visual changes- seeing spots, double, blurred vision Pain under your right breast or upper abdomen that does not go away with Tums or heartburn medicine Nausea and/or vomiting Severe swelling in your hands, feet, and face   Tdap Vaccine It is recommended that you get the Tdap vaccine during the third trimester of EACH pregnancy to help protect your baby from getting pertussis (whooping cough) 27-36 weeks is the BEST time to do  this so that you can pass the protection on to your baby. During pregnancy is better than after pregnancy, but if you are unable to get it during pregnancy it will be offered at the hospital.  You can get this vaccine with [place], at the health department, your family doctor, or some local pharmacies Everyone who will be around your baby should also be up-to-date on their vaccines before the baby comes. Adults (who are not pregnant) only need 1 dose of Tdap during adulthood.   [facility] Pediatricians/Family Doctors [facility]): [address] Colette Ribas, [phone]           [facility]: [address], [phone]                [facility]): [address], [phone] (call to ask if accepting patients) [facility] Department: [address], [place], [phone]    [facility] Pediatricians/Family Doctors [facility]): [phone], [phone] [facility]: [address] [place], [phone] [facility] [place]: [address], [phone]  [facility] Doctors  [facility]): [phone] [facility]: [address], [phone]   [facility] Doctors [facility]: [address], [phone]  [facility] Family Doctors  [facility]: [phone], [phone]  Home Blood Pressure Monitoring for Patients   Your provider has recommended that you check your  blood pressure (BP) at least once a week at home. If you do not have a blood pressure cuff at home, one will be provided for you. Contact your provider if you have not received your monitor within 1 week.   Helpful Tips for Accurate Home Blood Pressure Checks  Don't smoke, exercise, or drink caffeine 30 minutes before checking your BP Use the restroom before checking your BP (a full bladder can raise your  pressure) Relax in a comfortable upright chair Feet on the ground Left arm resting comfortably on a flat surface at the level of your heart Legs uncrossed Back supported Sit quietly and don't talk Place the cuff on your bare arm Adjust snuggly, so that only two fingertips can fit between your skin and the top of the cuff Check 2 readings separated by at least one minute Keep a log of your BP readings For a visual, please reference this diagram: http://ccnc.care/bpdiagram  Provider Name: [facility]     Phone: [phone]  Zone 1: ALL CLEAR  Continue to monitor your symptoms:  BP reading is less than 140 (top number) or less than 90 (bottom number)  No right upper stomach pain No headaches or seeing spots No feeling nauseated or throwing up No swelling in face and hands  Zone 2: CAUTION Call your doctor's office for any of the following:  BP reading is greater than 140 (top number) or greater than 90 (bottom number)  Stomach pain under your ribs in the middle or right side Headaches or seeing spots Feeling nauseated or throwing up Swelling in face and hands  Zone 3: EMERGENCY  Seek immediate medical care if you have any of the following:  BP reading is greater than160 (top number) or greater than 110 (bottom number) Severe headaches not improving with Tylenol Serious difficulty catching your breath Any worsening symptoms from Zone 2

## 2021-04-29 NOTE — Addendum Note (Signed)
Addended by: Colen Darling on: 04/29/2021 09:37 AM   Modules accepted: Orders

## 2021-05-01 LAB — STREP GP B NAA: Strep Gp B NAA: NEGATIVE

## 2021-05-06 ENCOUNTER — Encounter: Payer: Self-pay | Admitting: Women's Health

## 2021-05-13 ENCOUNTER — Encounter: Payer: Self-pay | Admitting: Women's Health

## 2021-05-13 ENCOUNTER — Ambulatory Visit (INDEPENDENT_AMBULATORY_CARE_PROVIDER_SITE_OTHER): Payer: Self-pay | Admitting: Women's Health

## 2021-05-13 ENCOUNTER — Other Ambulatory Visit: Payer: Self-pay

## 2021-05-13 VITALS — BP 98/56 | HR 76 | Wt 182.0 lb

## 2021-05-13 DIAGNOSIS — Z3483 Encounter for supervision of other normal pregnancy, third trimester: Secondary | ICD-10-CM

## 2021-05-13 NOTE — Progress Notes (Signed)
LOW-RISK PREGNANCY VISIT Patient name: Samantha Khan MRN [id number]  Date of birth: 01-14-1991 Chief Complaint:   Routine Prenatal Visit  History of Present Illness:   Samantha Khan is a 30 y.o. [id number] female at [redacted]w[redacted]d with an Estimated Date of Delivery: 05/27/21 being seen today for ongoing management of a low-risk pregnancy.   Today she reports  irregular contractions, cold/fever last week, better now . Contractions: Irritability. Vag. Bleeding: None.  Movement: Present. denies leaking of fluid.  Depression screen [facility] 2/9 02/21/2021 11/15/2020 07/21/2017  Decreased Interest 2 2 0  Down, Depressed, Hopeless 1 1 0  PHQ - 2 Score 3 3 0  Altered sleeping 3 2 0  Tired, decreased energy 1 2 1   Change in appetite 0 0 0  Feeling bad or failure about yourself  0 0 0  Trouble concentrating 0 0 0  Moving slowly or fidgety/restless 0 0 0  Suicidal thoughts 0 0 0  PHQ-9 Score 7 7 1      GAD 7 : Generalized Anxiety Score 02/21/2021 11/15/2020  Nervous, Anxious, on Edge 0 2  Control/stop worrying 0 1  Worry too much - different things 0 2  Trouble relaxing 0 2  Restless 0 2  Easily annoyed or irritable 2 2  Afraid - awful might happen 1 0  Total GAD 7 Score 3 11      Review of Systems:   Pertinent items are noted in HPI Denies abnormal vaginal discharge w/ itching/odor/irritation, headaches, visual changes, shortness of breath, chest pain, abdominal pain, severe nausea/vomiting, or problems with urination or bowel movements unless otherwise stated above. Pertinent History Reviewed:  Reviewed past medical,surgical, social, obstetrical and family history.  Reviewed problem list, medications and allergies. Physical Assessment:   Vitals:   05/13/21 0840  BP: (!) 98/56  Pulse: 76  Weight: 182 lb (82.6 kg)  Body mass index is 36.76 kg/m.        Physical Examination:   General appearance: Well appearing, and in no distress  Mental status: Alert, oriented to person, place, and  time  Skin: Warm & dry  Cardiovascular: Normal heart rate noted  Respiratory: Normal respiratory effort, no distress  Abdomen: Soft, gravid, nontender  Pelvic: Cervical exam performed  Dilation: 3 Effacement (%): 50 Station: -2  Extremities: Edema: Trace  Fetal Status: Fetal Heart Rate (bpm): 136 Fundal Height: 37 cm Movement: Present Presentation: Vertex  Chaperone: Angel Neas   No results found for this or any previous visit (from the past 24 hour(s)).  Assessment & Plan:  1) Low-risk pregnancy 01/15/2021 at [redacted]w[redacted]d with an Estimated Date of Delivery: 05/27/21    Meds: No orders of the defined types were placed in this encounter.  Labs/procedures today: SVE  Plan:  Continue routine obstetrical care  Next visit: prefers in person    Reviewed: Term labor symptoms and general obstetric precautions including but not limited to vaginal bleeding, contractions, leaking of fluid and fetal movement were reviewed in detail with the patient.  All questions were answered. Does have home bp cuff. Office bp cuff given: not applicable. Check bp weekly, let [redacted]w[redacted]d know if consistently >140 and/or >90.  Follow-up: Return for weekly, CNM, in person.  Future Appointments  Date Time Provider Department Center  05/27/2021  8:30 AM [place], CNM [facility]  07/16/2021  9:30 AM Cheral Marker, PA-C [facility] None    No orders of the defined types were placed in this encounter.  09/13/2021 CNM, [facility] 05/13/2021  9:21 AM

## 2021-05-13 NOTE — Patient Instructions (Addendum)
Samantha Khan, thank you for choosing our office today! We appreciate the opportunity to meet your healthcare needs. You may receive a short survey by mail, e-mail, or through [employer]. If you are happy with your care we would appreciate if you could take just a few minutes to complete the survey questions. We read all of your comments and take your feedback very seriously. Thank you again for choosing our office.  Center for [employer] Team at [facility]  [facility] & [facility] at [facility] ([address] [place], [place] [address]) Entrance C, located off of [address] 24/7 valet parking   Resfriados/Tos/Alergias:  Benadryl (sin alcohol) 25 mg cada 6 horas segn lo necesite Breath Right strips (Tiras para respirar correctamente)  Claritin  Cepacol (pastillas de chupar para la garganta)  Chloraseptic (aerosol para la garganta)  Cold-Eeze- hasta tres veces por da  Cough drops (pastillas de chupar para la tos, sin alcohol)  Flonase (con receta mdica solamente)  Guaifenesin  Mucinex  Robitussin DM (simple solamente, sin alcohol)  Saline nasal spray/drops (Aerosol nasal salino/gotas) Sudafed (pseudoephedrine) y  Actifed * utilizar slo despus de 12 semanas de gestacin y si no tiene la presin arterial alta.  Tylenol Vicks  VapoRub  Zinc lozenges (pastillas para la garganta)  Zyrtec  CLASSES: Go to [email or web address] to register for classes (childbirth, breastfeeding, waterbirth, infant CPR, daddy bootcamp, etc.)  Call the office [phone]) or go to [facility] if: You begin to have strong, frequent contractions Your water breaks.  Sometimes it is a big gush of fluid, sometimes it is just a trickle that keeps getting your panties wet or running down your legs You have vaginal bleeding.  It is normal to have a small amount of spotting if your cervix was checked.  You don't feel your baby moving like normal.  If you don't, get you something to eat and drink and lay  down and focus on feeling your baby move.   If your baby is still not moving like normal, you should call the office or go to [facility].  Call the office [phone]) or go to [facility] hospital for these signs of pre-eclampsia: Severe headache that does not go away with Tylenol Visual changes- seeing spots, double, blurred vision Pain under your right breast or upper abdomen that does not go away with Tums or heartburn medicine Nausea and/or vomiting Severe swelling in your hands, feet, and face   [facility] Pediatricians/Family Doctors [facility]): [address] Colette Ribas, [phone]           [facility]: [address], [phone]                [facility]): [address], [phone] (call to ask if accepting patients) [facility] Department: [address] [place], [phone]    [facility] Pediatricians/Family Doctors [facility]): [address], [phone] [facility]: [address] [place], [phone] [facility] [place]: [address], [phone]  [facility] Doctors  [facility] [place] [facility]): [phone] [facility]: [address], [phone]   [facility] Doctors [facility]: [address], [phone]  [facility] Doctors  [facility]: [phone], [phone]  Home Blood Pressure Monitoring for Patients   Your provider has recommended that you check your blood pressure (BP) at least once a week at  home. If you do not have a blood pressure cuff at home, one will be provided for you. Contact your provider if you have not received your monitor within 1 week.   Helpful Tips for Accurate Home Blood Pressure Checks  Don't smoke, exercise, or drink caffeine 30 minutes before checking your BP Use the restroom before checking your BP  (a full bladder can raise your pressure) Relax in a comfortable upright chair Feet on the ground Left arm resting comfortably on a flat surface at the level of your heart Legs uncrossed Back supported Sit quietly and don't talk Place the cuff on your bare arm Adjust snuggly, so that only two fingertips can fit between your skin and the top of the cuff Check 2 readings separated by at least one minute Keep a log of your BP readings For a visual, please reference this diagram: http://ccnc.care/bpdiagram  Provider Name: [facility]     Phone: [phone]  Zone 1: ALL CLEAR  Continue to monitor your symptoms:  BP reading is less than 140 (top number) or less than 90 (bottom number)  No right upper stomach pain No headaches or seeing spots No feeling nauseated or throwing up No swelling in face and hands  Zone 2: CAUTION Call your doctor's office for any of the following:  BP reading is greater than 140 (top number) or greater than 90 (bottom number)  Stomach pain under your ribs in the middle or right side Headaches or seeing spots Feeling nauseated or throwing up Swelling in face and hands  Zone 3: EMERGENCY  Seek immediate medical care if you have any of the following:  BP reading is greater than160 (top number) or greater than 110 (bottom number) Severe headaches not improving with Tylenol Serious difficulty catching your breath Any worsening symptoms from Zone 2  Signos y sntomas del trabajo de parto Signs and Symptoms of Labor [place] es el proceso natural del cuerpo para Development worker, community al beb y a la placenta del tero. Por lo general, el proceso del trabajo de parto comienza cuando el embarazo ha llegado a su trmino, entre las semanas 39 y 41 del Psychiatrist. Signos y sntomas de que est cerca de empezar el [place] de parto A medida que el cuerpo se prepara para el [place] de parto y el nacimiento del beb, puede notar los siguientes sntomas en las semanas y  Advance anteriores al trabajo de parto propiamente dicho: Eliminacin de una pequea cantidad de mucosidad espesa y sanguinolenta por la vagina. A esto se lo llama aparicin normal de sangre o prdida del tapn mucoso. Esto puede suceder ms de una semana antes de que comience el [place], o justo antes de que comience el [place] de parto a medida que el cuello uterino comienza a ensancharse (dilatarse). En algunas mujeres, el tapn [employer] entero de una sola vez. En otras, pueden salir partes del tapn mucoso de forma gradual [employer]. El beb se mueve (desciende) a la parte inferior de la pelvis para ponerse en posicin para el nacimiento (aligeramiento). Cuando esto sucede, puede sentir ms presin en la vejiga y el hueso plvico, y menos presin en las costillas. Esto facilitar la respiracin. Tambin puede hacer que necesite orinar con ms frecuencia y que tenga problemas para Stage manager. Tener "contracciones de prctica", tambin llamadas contracciones de [place], o [place] de [place]. Estas se producen a intervalos irregulares (espaciadas de modo desigual) con una diferencia de ms de 10  minutos. Las contracciones de [place] de parto falso son frecuentes despus del ejercicio o la actividad sexual. Se detendrn si cambia de posicin, descansa o bebe lquidos. Estas contracciones son generalmente leves y no se tornan ms fuertes con el tiempo. Pueden sentirse como lo siguiente: Un dolor de espalda. Calambres leves, similares a los [employer]. Tirantez o presin en el abdomen. Otros sntomas tempranos pueden ser los siguientes: Nuseas o prdida del apetito. Diarrea. Una repentina explosin de energa o sentirse muy cansada. Cambios en el humor. Problemas para dormir. Signos y sntomas de que ha comenzado el trabajo de parto [place] signos de que est en trabajo de parto pueden incluir los siguientes: Contracciones a intervalos regulares (espaciadas  de modo regular) que se incrementan en intensidad. Esto puede sentirse como presin o estrechamiento intenso en el abdomen, que se desplaza hacia la espalda. Las contracciones pueden sentirse tambin como dolor rtmico en la parte superior de los muslos y la espalda que va y viene a intervalos regulares. Si es la primera vez que da a Patent examiner, este cambio en la intensidad de las contracciones ocurre generalmente a un ritmo ms gradual. Si ya ha dado a luz antes, puede notar una progresin ms rpida de los cambios de las contracciones. Sensacin de presin en el rea vaginal. Ruptura de la bolsa (ruptura de las [place]). Se produce cuando el saco de lquido que rodea al beb se rompe. La prdida de lquido de la vagina puede ser transparente o estar teida de sangre. Generalmente el trabajo de parto comienza 24 horas despus de la ruptura de [place], [place] puede tomar ms Psychologist, clinical. Algunas personas pueden sentir un chorro repentino de lquido; otras pueden observar ropa interior hmeda de forma repetida. Siga estas instrucciones en su casa:  Cuando comience el trabajo de parto o si rompe bolsa, llame al mdico o a la lnea de atencin de enfermera. Ellos determinarn, en funcin de su situacin, cundo debe ir a Location manager. Durante el trabajo de parto temprano, es posible que pueda descansar y [employer] sntomas en su casa. Algunas estrategias para probar en su casa incluyen: Tcnicas de respiracin y relajacin. Tomar una ducha o un bao de inmersin tibios. Escuchar msica. Usar una almohadilla trmica en la espalda para Engineer, materials. Si se lo indican, aplique calor en la zona con la frecuencia que le haya indicado el mdico. Use la fuente de calor que el mdico le recomiende, como una compresa de calor hmedo o una almohadilla trmica. Coloque una toalla entre la piel y la fuente de Airline pilot. Aplique calor durante 20 a 30 minutos. Retire la fuente de calor si la piel se pone de  color rojo brillante. Esto es especialmente importante si no puede sentir dolor, calor o fro. Corre un mayor riesgo de sufrir quemaduras. Comunquese con un mdico si: Comenz el trabajo de [place]. Rompe la bolsa. Tiene nuseas, vmitos o diarrea. Solicite ayuda de inmediato si: Tiene contracciones dolorosas y regulares cada 5 minutos o menos. El trabajo de parto comienza antes de que se cumplan las 37 semanas de [place]. Tiene fiebre. Elimina cogulos de sangre de color rojo brillante por la vagina. No siente que el beb se mueva. Tiene dolor de cabeza intenso con o sin problemas de visin. Siente falta de aire o Journalist, newspaper. Estos sntomas pueden representar un problema grave que constituye Radio broadcast assistant. No espere a ver si los sntomas desaparecen. Solicite atencin mdica de inmediato. Comunquese con el servicio de emergencias  de su localidad (911 en los [place]). No conduzca por sus propios medios [employer]. Resumen El trabajo de parto es el proceso natural del cuerpo por el cual se saca al beb y la placenta del tero. Por lo general, el proceso del trabajo de parto comienza cuando el embarazo ha llegado a su trmino, entre las semanas 39 y 40 de Psychiatrist. Cuando comience el trabajo de parto o si rompe bolsa, llame al mdico o a la lnea de atencin de enfermera. Ellos determinarn, en funcin de su situacin, cundo debe ir a Location manager. Esta informacin no tiene Theme park manager el consejo del mdico. Asegrese de hacerle al mdico cualquier pregunta que tenga. Document Revised: 11/27/2020 Document Reviewed: 11/27/2020 Elsevier Patient Education  2022 [employer].

## 2021-05-19 ENCOUNTER — Other Ambulatory Visit: Payer: Self-pay

## 2021-05-19 ENCOUNTER — Inpatient Hospital Stay (HOSPITAL_COMMUNITY)
Admission: AD | Admit: 2021-05-19 | Discharge: 2021-05-21 | DRG: 807 | Disposition: A | Discharge status: Home / Self Care | Payer: Medicaid Other | Attending: Family Medicine | Admitting: Family Medicine

## 2021-05-19 DIAGNOSIS — Z3A38 38 weeks gestation of pregnancy: Secondary | ICD-10-CM | POA: Diagnosis not present

## 2021-05-19 DIAGNOSIS — Z3490 Encounter for supervision of normal pregnancy, unspecified, unspecified trimester: Secondary | ICD-10-CM

## 2021-05-19 DIAGNOSIS — Q513 Bicornate uterus: Secondary | ICD-10-CM | POA: Diagnosis not present

## 2021-05-19 DIAGNOSIS — R8271 Bacteriuria: Secondary | ICD-10-CM | POA: Diagnosis present

## 2021-05-19 DIAGNOSIS — Z20822 Contact with and (suspected) exposure to covid-19: Secondary | ICD-10-CM | POA: Diagnosis present

## 2021-05-19 DIAGNOSIS — O3403 Maternal care for unspecified congenital malformation of uterus, third trimester: Secondary | ICD-10-CM | POA: Diagnosis present

## 2021-05-19 DIAGNOSIS — O26893 Other specified pregnancy related conditions, third trimester: Secondary | ICD-10-CM | POA: Diagnosis present

## 2021-05-19 DIAGNOSIS — Z3A39 39 weeks gestation of pregnancy: Secondary | ICD-10-CM

## 2021-05-19 MED ORDER — OXYTOCIN-SODIUM CHLORIDE 30-0.9 UT/500ML-% IV SOLN
2.5000 [IU]/h | INTRAVENOUS | Status: DC
  Filled 2021-05-19: qty 500

## 2021-05-19 MED ORDER — ACETAMINOPHEN 325 MG PO TABS: 650.0000 mg | ORAL_TABLET | ORAL | Status: DC | PRN

## 2021-05-19 MED ORDER — ONDANSETRON HCL 4 MG/2ML IJ SOLN: 4.0000 mg | Freq: Four times a day (QID) | INTRAMUSCULAR | Status: DC | PRN

## 2021-05-19 MED ORDER — OXYCODONE-ACETAMINOPHEN 5-325 MG PO TABS: 1.0000 | ORAL_TABLET | ORAL | Status: DC | PRN

## 2021-05-19 MED ORDER — SOD CITRATE-CITRIC ACID 500-334 MG/5ML PO SOLN: 30.0000 mL | ORAL | Status: DC | PRN

## 2021-05-19 MED ORDER — OXYCODONE-ACETAMINOPHEN 5-325 MG PO TABS: 2.0000 | ORAL_TABLET | ORAL | Status: DC | PRN

## 2021-05-19 MED ORDER — FENTANYL CITRATE (PF) 100 MCG/2ML IJ SOLN: 50.0000 ug | INTRAMUSCULAR | Status: DC | PRN

## 2021-05-19 MED ORDER — LACTATED RINGERS IV SOLN: 500.0000 mL | INTRAVENOUS | Status: DC | PRN

## 2021-05-19 MED ORDER — LIDOCAINE HCL (PF) 1 % IJ SOLN: 30.0000 mL | INTRAMUSCULAR | Status: DC | PRN

## 2021-05-19 MED ORDER — LACTATED RINGERS IV SOLN: INTRAVENOUS | Status: DC

## 2021-05-19 MED ORDER — OXYTOCIN BOLUS FROM INFUSION
333.0000 mL | Freq: Once | INTRAVENOUS | Status: AC
  Administered 2021-05-19: 333 mL via INTRAVENOUS

## 2021-05-19 NOTE — H&P (Signed)
OBSTETRIC ADMISSION HISTORY AND PHYSICAL  Samantha Khan is a 30 y.o. female [phone] with IUP at [redacted]w[redacted]d by 8 week [place] presenting for SOL. She is having regular contractions. She does not recall her water breaking before coming in. She plans on breast feeding. She is undecided about birth control postpartum.  She received her prenatal care at [facility].  Dating: By [place] --->  Estimated Date of Delivery: 05/27/21  Sono:   @[redacted]w[redacted]d , CWD, normal anatomy, cephalic presentation, fundal placental lie, 298 g, 75% EFW  Prenatal History/Complications:  Asymptomatic bacteruria  Past Medical History: Past Medical History:  Diagnosis Date   Abnormal Pap smear of cervix    Anal fissure 02/2013   Medical history non-contributory    Pregnancy    Supervision of normal pregnancy 07/21/2017    [facility] Results Initiated care at 13wk Pap  04/17/2016 negative RCHD  Dating by 1st trimester U/S 11wk GC/CT Initial:             -/-       36wks:     -/- Support Person [facility] NT/IT:    neg Flu vaccine   CF: 07/20/12 neg        SMA:                Sickle Cell:07/20/12 neg Tdap vaccine Recommended ~28wks  5/29 Blood type  O+               Rhogam:    Antibody Negativ    Past Surgical History: Past Surgical History:  Procedure Laterality Date   COLPOSCOPY  03/2011   NO PAST SURGERIES      Obstetrical History: OB History     Gravida  4   Para  2   Term  2   Preterm      AB  1   Living  2      SAB  1   IAB      Ectopic      Multiple      Live Births  2           Social History Social History   Socioeconomic History   Marital status: Single    Spouse name: Not on file   Number of children: Not on file   Years of education: Not on file   Highest education level: Not on file  Occupational History   Not on file  Tobacco Use   Smoking status: Never   Smokeless tobacco: Never  Vaping Use   Vaping Use: Never used  Substance and Sexual Activity    Alcohol use: No   Drug use: No   Sexual activity: Yes    Birth control/protection: None  Other Topics Concern   Not on file  Social History Narrative   Not on file   Social Determinants of Health   Financial Resource Strain: Medium Risk   Difficulty of Paying Living Expenses: Somewhat hard  Food Insecurity: No Food Insecurity   Worried About 04/2011 in the Last Year: Never true   Ran Out of Food in the Last Year: Never true  Transportation Needs: No Transportation Needs   Lack of Transportation (Medical): No   Lack of Transportation (Non-Medical): No  Physical Activity: Insufficiently Active   Days of Exercise per Week: 3 days   Minutes of Exercise per Session: 20 min  Stress: Stress Concern Present   Feeling of Stress : To some extent  Social Connections:  Moderately Integrated   Frequency of Communication with Friends and Family: More than three times a week   Frequency of Social Gatherings with Friends and Family: Three times a week   Attends Religious Services: 1 to 4 times per year   Active Member of Clubs or Organizations: No   Attends Banker Meetings: Never   Marital Status: Living with partner    Family History: Family History  Problem Relation Age of Onset   Hypertension Maternal Grandmother    Diabetes Maternal Grandmother    Diabetes Paternal Grandmother    Diabetes Paternal Grandfather    Diabetes Maternal Grandfather     Allergies: No Known Allergies  Medications Prior to Admission  Medication Sig Dispense Refill Last Dose   acetaminophen (TYLENOL) 500 MG tablet Take 500 mg by mouth every 6 (six) hours as needed.      Dextromethorphan-guaiFENesin (ROBITUSSIN DM PO) Take by mouth.      diphenhydrAMINE (BENADRYL) 25 mg capsule Take 25 mg by mouth at bedtime as needed.      ferrous sulfate 325 (65 FE) MG tablet Take 1 tablet (325 mg total) by mouth every other day. 45 tablet 2    phenol (SORE THROAT SPRAY) 1.4 % LIQD Use as  directed 1 spray in the mouth or throat as needed for throat irritation / pain.      Prenatal Vit-Fe Fumarate-FA (PRENATAL VITAMIN PO) Take by mouth.      promethazine (PHENERGAN) 25 MG tablet Take 0.5-1 tablets (12.5-25 mg total) by mouth every 6 (six) hours as needed for nausea or vomiting. (Patient not taking: Reported on 05/13/2021) 30 tablet 0      Review of Systems  All systems reviewed and negative except as stated in HPI  Blood pressure (!) 101/52, pulse 74.  General appearance: alert, cooperative, and no distress Lungs: normal work of breathing on room air  Heart: normal rate, warm and well perfused  Abdomen: gravid  Extremities: no LE edema or calf tenderness to palpation   Presentation:  Cephalic  Dilation: 10 Exam by:: Dr Mathis Fare   Prenatal labs: ABO, Rh: --/--/O POS (11/13 2350) Antibody: NEG (11/13 2350) Rubella: 17.10 (05/12 1231) RPR: Non Reactive (08/18 0846)  HBsAg: Negative (05/12 1231)  HIV: Non Reactive (08/18 0846)  GBS: Negative/-- (10/24 1530)  2 hr Glucola normal Genetic screening - LR NIPS female  Anatomy US normal  Prenatal Transfer Tool  Maternal Diabetes: No Genetic Screening: Normal Maternal Ultrasounds/Referrals: Normal Fetal Ultrasounds or other Referrals:  None Maternal Substance Abuse:  No Significant Maternal Medications:  None Significant Maternal Lab Results: Group B Strep negative  Results for orders placed or performed during the hospital encounter of 05/19/21 (from the past 24 hour(s))  CBC   Collection Time: 05/19/21 11:50 PM  Result Value Ref Range   WBC 17.3 (H) 4.0 - 10.5 K/uL   RBC 4.18 3.87 - 5.11 MIL/uL   Hemoglobin 13.9 12.0 - 15.0 g/dL   HCT 88.8 28.0 - 03.4 %   MCV 95.5 80.0 - 100.0 fL   MCH 33.3 26.0 - 34.0 pg   MCHC 34.8 30.0 - 36.0 g/dL   RDW 91.7 91.5 - 05.6 %   Platelets 389 150 - 400 K/uL   nRBC 0.0 0.0 - 0.2 %  Type and screen [facility]   Collection Time: 05/19/21 11:50 PM  Result  Value Ref Range   ABO/RH(D) O POS    Antibody Screen NEG    Sample Expiration  05/22/2021,2359 Performed at [facility], 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 [id number] at [redacted]w[redacted]d here for SOL.   #Labor: Patient complete and involuntarily pushing upon arrival to L&D. Anticipate SVD shortly.  #Pain: Maternal support  #FWB: Patient just placed on monitor. Baseline appears normal. Adjusting monitor to obtain better tracing.  #ID:  GBS neg #MOF: Breast #MOC: Unsure   Worthy Rancher, MD  05/20/2021, 12:47 AM

## 2021-05-20 ENCOUNTER — Encounter: Payer: Self-pay | Admitting: Women's Health

## 2021-05-20 ENCOUNTER — Encounter (HOSPITAL_COMMUNITY): Payer: Self-pay | Admitting: Family Medicine

## 2021-05-20 LAB — RESP PANEL BY RT-PCR (FLU A&B, COVID) ARPGX2
Influenza A by PCR: POSITIVE — AB
Influenza B by PCR: NEGATIVE
SARS Coronavirus 2 by RT PCR: NEGATIVE

## 2021-05-20 LAB — CBC
HCT: 39.9 % (ref 36.0–46.0)
Hemoglobin: 13.9 g/dL (ref 12.0–15.0)
MCH: 33.3 pg (ref 26.0–34.0)
MCHC: 34.8 g/dL (ref 30.0–36.0)
MCV: 95.5 fL (ref 80.0–100.0)
Platelets: 389 10*3/uL (ref 150–400)
RBC: 4.18 MIL/uL (ref 3.87–5.11)
RDW: 14.4 % (ref 11.5–15.5)
WBC: 17.3 10*3/uL — ABNORMAL HIGH (ref 4.0–10.5)
nRBC: 0 % (ref 0.0–0.2)

## 2021-05-20 LAB — TYPE AND SCREEN
ABO/RH(D): O POS
Antibody Screen: NEGATIVE

## 2021-05-20 LAB — RPR: RPR Ser Ql: NONREACTIVE

## 2021-05-20 MED ORDER — SIMETHICONE 80 MG PO CHEW: 80.0000 mg | CHEWABLE_TABLET | ORAL | Status: DC | PRN

## 2021-05-20 MED ORDER — ONDANSETRON HCL 4 MG PO TABS: 4.0000 mg | ORAL_TABLET | ORAL | Status: DC | PRN

## 2021-05-20 MED ORDER — DIBUCAINE (PERIANAL) 1 % EX OINT: 1.0000 "application " | TOPICAL_OINTMENT | CUTANEOUS | Status: DC | PRN

## 2021-05-20 MED ORDER — PRENATAL MULTIVITAMIN CH
1.0000 | ORAL_TABLET | Freq: Every day | ORAL | Status: DC
  Administered 2021-05-20 – 2021-05-21 (×2): 1 via ORAL
  Filled 2021-05-20 (×2): qty 1

## 2021-05-20 MED ORDER — ZOLPIDEM TARTRATE 5 MG PO TABS: 5.0000 mg | ORAL_TABLET | Freq: Every evening | ORAL | Status: DC | PRN

## 2021-05-20 MED ORDER — ACETAMINOPHEN 325 MG PO TABS
650.0000 mg | ORAL_TABLET | ORAL | Status: DC | PRN
  Administered 2021-05-20: 650 mg via ORAL
  Filled 2021-05-20: qty 2

## 2021-05-20 MED ORDER — DIPHENHYDRAMINE HCL 25 MG PO CAPS: 25.0000 mg | ORAL_CAPSULE | Freq: Four times a day (QID) | ORAL | Status: DC | PRN

## 2021-05-20 MED ORDER — TETANUS-DIPHTH-ACELL PERTUSSIS 5-2.5-18.5 LF-MCG/0.5 IM SUSY: 0.5000 mL | PREFILLED_SYRINGE | Freq: Once | INTRAMUSCULAR | Status: DC

## 2021-05-20 MED ORDER — IBUPROFEN 600 MG PO TABS
600.0000 mg | ORAL_TABLET | Freq: Four times a day (QID) | ORAL | Status: DC
  Administered 2021-05-20 – 2021-05-21 (×6): 600 mg via ORAL
  Filled 2021-05-20 (×7): qty 1

## 2021-05-20 MED ORDER — MEASLES, MUMPS & RUBELLA VAC IJ SOLR: 0.5000 mL | Freq: Once | INTRAMUSCULAR | Status: DC

## 2021-05-20 MED ORDER — ONDANSETRON HCL 4 MG/2ML IJ SOLN: 4.0000 mg | INTRAMUSCULAR | Status: DC | PRN

## 2021-05-20 MED ORDER — BENZOCAINE-MENTHOL 20-0.5 % EX AERO: 1.0000 "application " | INHALATION_SPRAY | CUTANEOUS | Status: DC | PRN

## 2021-05-20 MED ORDER — WITCH HAZEL-GLYCERIN EX PADS: 1.0000 "application " | MEDICATED_PAD | CUTANEOUS | Status: DC | PRN

## 2021-05-20 MED ORDER — COCONUT OIL OIL: 1.0000 "application " | TOPICAL_OIL | Status: DC | PRN

## 2021-05-20 MED ORDER — SENNOSIDES-DOCUSATE SODIUM 8.6-50 MG PO TABS
2.0000 | ORAL_TABLET | Freq: Every day | ORAL | Status: DC
  Administered 2021-05-21: 2 via ORAL
  Filled 2021-05-20: qty 2

## 2021-05-20 NOTE — Plan of Care (Signed)
  Problem: Education: °Goal: Knowledge of Childbirth will improve °Outcome: Completed/Met °Goal: Ability to make informed decisions regarding treatment and plan of care will improve °Outcome: Completed/Met °Goal: Ability to state and carry out methods to decrease the pain will improve °Outcome: Completed/Met °Goal: Individualized Educational Video(s) °Outcome: Completed/Met °  °Problem: Coping: °Goal: Ability to verbalize concerns and feelings about labor and delivery will improve °Outcome: Completed/Met °  °Problem: Life Cycle: °Goal: Ability to make normal progression through stages of labor will improve °Outcome: Completed/Met °Goal: Ability to effectively push during vaginal delivery will improve °Outcome: Completed/Met °  °Problem: Role Relationship: °Goal: Will demonstrate positive interactions with the child °Outcome: Completed/Met °  °Problem: Safety: °Goal: Risk of complications during labor and delivery will decrease °Outcome: Completed/Met °  °Problem: Pain Management: °Goal: Relief or control of pain from uterine contractions will improve °Outcome: Completed/Met °  °Problem: Education: °Goal: Knowledge of General Education information will improve °Description: Including pain rating scale, medication(s)/side effects and non-pharmacologic comfort measures °Outcome: Progressing °  °Problem: Health Behavior/Discharge Planning: °Goal: Ability to manage health-related needs will improve °Outcome: Progressing °  °Problem: Clinical Measurements: °Goal: Ability to maintain clinical measurements within normal limits will improve °Outcome: Progressing °Goal: Will remain free from infection °Outcome: Progressing °Goal: Diagnostic test results will improve °Outcome: Progressing °Goal: Respiratory complications will improve °Outcome: Progressing °Goal: Cardiovascular complication will be avoided °Outcome: Progressing °  °Problem: Activity: °Goal: Risk for activity intolerance will decrease °Outcome: Progressing °   °Problem: Nutrition: °Goal: Adequate nutrition will be maintained °Outcome: Progressing °  °Problem: Coping: °Goal: Level of anxiety will decrease °Outcome: Progressing °  °Problem: Elimination: °Goal: Will not experience complications related to bowel motility °Outcome: Progressing °Goal: Will not experience complications related to urinary retention °Outcome: Progressing °  °Problem: Pain Managment: °Goal: General experience of comfort will improve °Outcome: Progressing °  °Problem: Safety: °Goal: Ability to remain free from injury will improve °Outcome: Progressing °  °Problem: Skin Integrity: °Goal: Risk for impaired skin integrity will decrease °Outcome: Progressing °  °

## 2021-05-20 NOTE — Discharge Summary (Signed)
Postpartum Discharge Summary     Patient Name: Samantha Khan DOB: January 29, 1991 MRN: [id number]  Date of admission: 05/19/2021 Delivery date:05/19/2021  Delivering provider: Genia Del  Date of discharge: 05/21/2021  Admitting diagnosis: Indication for care in labor or delivery [O75.9] Intrauterine pregnancy: [redacted]w[redacted]d    Secondary diagnosis:  Principal Problem:   SVD (spontaneous vaginal delivery) Active Problems:   Encounter for supervision of normal pregnancy, antepartum   Asymptomatic bacteriuria during pregnancy in first trimester   Indication for care in labor or delivery  Additional problems: None    Discharge diagnosis: Term Pregnancy Delivered                                              Post partum procedures: None Augmentation: N/A Complications: None  Hospital course: Onset of Labor With Vaginal Delivery      30y.o. yo [id number] admitted in Active Labor on 05/19/2021. Patient had an uncomplicated labor course as follows:  Membrane Rupture Time/Date: 11:54 PM ,05/19/2021   Delivery Method:Vaginal, Spontaneous  Episiotomy: None  Lacerations:  None  Patient had an uncomplicated postpartum course.  She is ambulating, tolerating a regular diet, passing flatus, and urinating well. Patient is discharged home in stable condition on 05/21/21.  Newborn Data: Birth date:05/19/2021  Birth time:11:54 PM  Gender:Female  Living status:Living  Apgars:9 ,9  Weight:3070 g   Magnesium Sulfate received: No BMZ received: No Rhophylac: N/A MMR: N/A T-DaP: Given prenatally Flu: No Transfusion: No  Physical exam  Vitals:   05/20/21 0915 05/20/21 1540 05/20/21 1935 05/21/21 0500  BP: 105/61 (!) 100/56 (!) 93/58 99/67  Pulse: 64 62 63 (!) 55  Resp: _0 Temp: 97.8 F (36.6 C) 98.2 F (36.8 C) 97.7 F (36.5 C) 98.1 F (36.7 C)  TempSrc: Axillary Axillary Oral   SpO2: 99% 98%  99%  Weight:      Height:       General: alert, cooperative,  and no distress Lochia: appropriate Uterine Fundus: firm and below umbilicus  DVT Evaluation: no LE edema or calf tenderness to palpation   Labs: Lab Results  Component Value Date   WBC 17.3 (H) 05/19/2021   HGB 13.9 05/19/2021   HCT 39.9 05/19/2021   MCV 95.5 05/19/2021   PLT 389 05/19/2021   CMP Latest Ref Rng & Units 01/19/2020  Glucose 70 - 99 mg/dL 97  BUN 6 - 20 mg/dL 14  Creatinine 0.44 - 1.00 mg/dL 0.60  Sodium 135 - 145 mmol/L 135  Potassium 3.5 - 5.1 mmol/L 3.6  Chloride 98 - 111 mmol/L 103  CO2 22 - 32 mmol/L 24  Calcium 8.9 - 10.3 mg/dL 8.7(L)  Total Protein 6.5 - 8.1 g/dL 7.2  Total Bilirubin 0.3 - 1.2 mg/dL 0.5  Alkaline Phos 38 - 126 U/L 78  AST 15 - 41 U/L 36  ALT 0 - 44 U/L 56(H)   EdFlavia Shippercore: Edinburgh Postnatal Depression Scale Screening Tool 05/21/2021  I have been able to laugh and see the funny side of things. 0  I have looked forward with enjoyment to things. 1  I have blamed myself unnecessarily when things went wrong. 2  I have been anxious or worried for no good reason. 2  I have felt scared or panicky for no good reason. 0  Things have  been getting on top of me. 2  I have been so unhappy that I have had difficulty sleeping. 1  I have felt sad or miserable. 1  I have been so unhappy that I have been crying. 1  The thought of harming myself has occurred to me. 0  Edinburgh Postnatal Depression Scale Total 10     After visit meds:  Allergies as of 05/21/2021   No Known Allergies      Medication List     STOP taking these medications    diphenhydrAMINE 25 mg capsule Commonly known as: BENADRYL   promethazine 25 MG tablet Commonly known as: PHENERGAN   ROBITUSSIN DM PO   Sore Throat Spray 1.4 % Liqd Generic drug: phenol       TAKE these medications    acetaminophen 500 MG tablet Commonly known as: TYLENOL Take 2 tablets (1,000 mg total) by mouth every 8 (eight) hours as needed (pain). What changed:  how much to  take when to take this reasons to take this   ferrous sulfate 325 (65 FE) MG tablet Take 1 tablet (325 mg total) by mouth every other day.   ibuprofen 600 MG tablet Commonly known as: ADVIL Take 1 tablet (600 mg total) by mouth every 6 (six) hours as needed (pain).   PRENATAL VITAMIN PO Take by mouth.   senna-docusate 8.6-50 MG tablet Commonly known as: Senokot-S Take 2 tablets by mouth daily as needed for mild constipation.         Discharge home in stable condition Infant Feeding: Bottle and Breast Infant Disposition: home with mother Discharge instruction: per After Visit Summary and Postpartum booklet. Activity: Advance as tolerated. Pelvic rest for 6 weeks.  Diet: routine diet Future Appointments: Future Appointments  Date Time Provider [place]  07/02/2021 10:50 AM [name], DO [facility]  07/16/2021  9:30 AM Soyla Dryer, PA-C [facility] None   Follow up Visit:  [facility] Follow up on 07/16/2021.   Why: 9:30 am with Soyla Dryer PA-PCP Contact information: [place] [id number] [phone]               Please schedule this patient for a Virtual postpartum visit in 6 weeks with the following provider: Any provider. Additional Postpartum F/U: Mood check in one week, message sent 05/21/21 due to [place] of 10   Low risk pregnancy  Delivery mode:  Vaginal, Spontaneous  Anticipated Birth Control:  Condoms   05/21/2021 Genia Del, MD

## 2021-05-20 NOTE — Progress Notes (Signed)
Post Partum Day 1 Subjective: no complaints, up ad lib, voiding, and + flatus  Objective: Blood pressure (!) 106/58, pulse 70, temperature 97.9 F (36.6 C), temperature source Axillary, resp. rate 20, height 4\' 11"  (1.499 m), weight 82.6 kg, SpO2 98 %, unknown if currently breastfeeding.  Physical Exam:  General: alert, cooperative, and no distress Lochia: appropriate Uterine Fundus: firm Incision: n/a DVT Evaluation: No evidence of DVT seen on physical exam. Negative Homan's sign. No cords or calf tenderness. No significant calf/ankle edema.  Recent Labs    05/19/21 2350  HGB 13.9  HCT 39.9    Assessment/Plan: Plan for discharge tomorrow Overall patient is doing well this morning. Has not eaten yet but is hungry and waiting for breakfast to arrive. Otherwise meeting all milestones without complaints. Anticipate discharge tomorrow. #MOF: breast and bottle #MOC: condoms   LOS: 1 day   05/21/21 05/20/2021, 7:11 AM

## 2021-05-21 MED ORDER — SENNOSIDES-DOCUSATE SODIUM 8.6-50 MG PO TABS
2.0000 | ORAL_TABLET | Freq: Every day | ORAL | 0 refills | Status: DC | PRN
Start: 2021-05-21 — End: 2021-07-16

## 2021-05-21 MED ORDER — ACETAMINOPHEN 500 MG PO TABS: 1000.0000 mg | ORAL_TABLET | Freq: Three times a day (TID) | ORAL | 0 refills | Status: DC | PRN

## 2021-05-21 MED ORDER — OXYCODONE HCL 5 MG PO TABS
5.0000 mg | ORAL_TABLET | Freq: Once | ORAL | Status: AC
  Administered 2021-05-21: 5 mg via ORAL
  Filled 2021-05-21: qty 1

## 2021-05-21 MED ORDER — IBUPROFEN 600 MG PO TABS: 600.0000 mg | ORAL_TABLET | Freq: Four times a day (QID) | ORAL | 0 refills | Status: DC | PRN

## 2021-05-21 NOTE — Social Work (Signed)
CSW received consult for hx of Edinburgh 11. CSW met with MOB to offer support and complete assessment.    CSW met with MOB at bedside and introduced CSW role. CSW observed MOB lying in bed with infant next to her, and FOB sitting at bedside. MOB presented calm and welcomed CSW to complete the assessment with FOB present. CSW inquired how MOB has felt since giving birth. MOB reported feeling fine and happy about the L&D that "went fast and quick." CSW discussed the [place] and how MOB felt for the past seven days. MOB reported during the pregnancy she was happy however she felt sad at times. MOB shared she has been more worried about how she is going to manage her schedule with three children. MOB gave example, she has to take her oldest son to school, take her three-year-old to speech therapy and now caring for the baby. MOB presented tearful as she explained her worries. MOB reported she will have support from FOB and her father. CSW provided actively listening and validated MOB concerns. CSW inquired if MOB has history of mental health. MOB no history of mental health. MOB reported no history of medication or therapy treatment. CSW discussed the benefits of therapy and talking with a counselor. MOB shared she was open to seeing a Social worker. CSW provided MOB mental health resources for follow up. MOB was very Patent attorney. CSW inquired if MOB experienced PPD. MOB reported she experienced PPD as evidenced by her feeling very tearful for about two months after giving birth. CSW provided education regarding the baby blues period vs. perinatal mood disorders.  CSW recommends self-evaluation during the postpartum time period using the New Mom Checklist from Postpartum Progress and encouraged MOB to contact a medical professional if symptoms are noted at any time. MOB reported she feels comfortable reaching out to her doctor is concerns arise. CSW assessed MOB for safety. MOB denied thoughts for harm to self and  others.   CSW provided review of Sudden Infant Death Syndrome (SIDS) precautions. MOB reported she has essential items for the infant including a bassinet where the infant will sleep. MOB chosen [facility] for infant's follow up care. CSW assessed MOB for additional needs. MOB reported no further need.  CSW identifies no further need for intervention and no barriers to discharge at this time.   Kathrin Greathouse, MSW, LCSW [facility] [place] Worker  [phone] 05/21/2021  12:19 PM

## 2021-05-27 ENCOUNTER — Encounter: Payer: Self-pay | Admitting: Women's Health

## 2021-05-29 ENCOUNTER — Ambulatory Visit: Payer: Self-pay | Admitting: Women's Health

## 2021-05-31 ENCOUNTER — Telehealth (HOSPITAL_COMMUNITY): Payer: Self-pay | Admitting: *Deleted

## 2021-05-31 NOTE — Telephone Encounter (Signed)
Attempted hospital discharge follow-up call with [employer]. No answer received. Deforest Hoyles, RN, 05/31/21, [phone]

## 2021-06-27 IMAGING — US US OB < 14 WEEKS - US OB TV
1 series · 15 of 28 positions shown · non-contrast
Comparison: None

CLINICAL DATA: Supervision of first trimester pregnancy, vaginal
bleeding and pain, LMP = 12/20/2019, quantitative beta HCG = 10,593

EXAM:
OBSTETRIC <14 WK US AND TRANSVAGINAL OB US
TECHNIQUE: Both transabdominal and transvaginal ultrasound examinations were
performed for complete evaluation of the gestation as well as the
maternal uterus, adnexal regions, and pelvic cul-de-sac.
Transvaginal technique was performed to assess early pregnancy.

[Series 1: us ob < 14 weeks - us ob tv · 73 acquisitions, 15 frames shown]
[im 1/73]
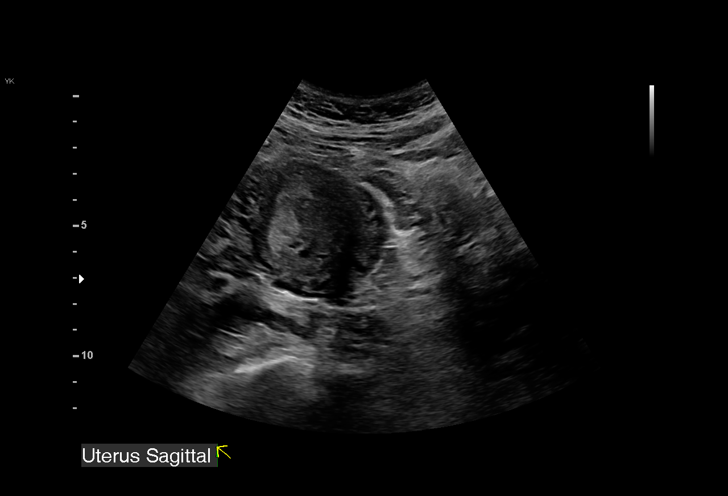
[im 6/73]
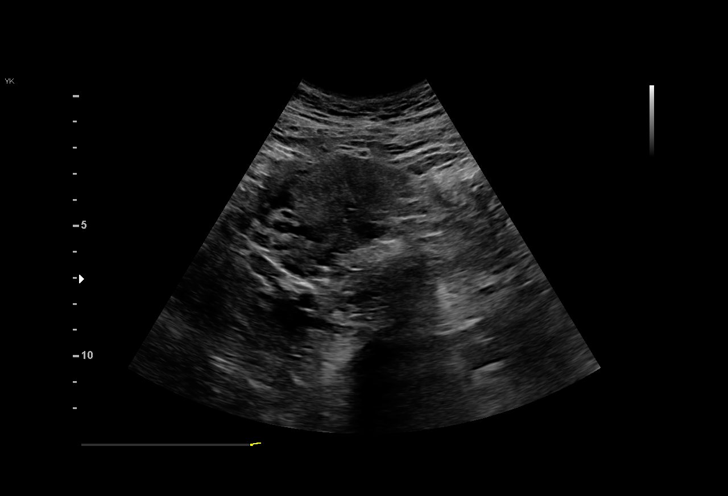
[im 11/73]
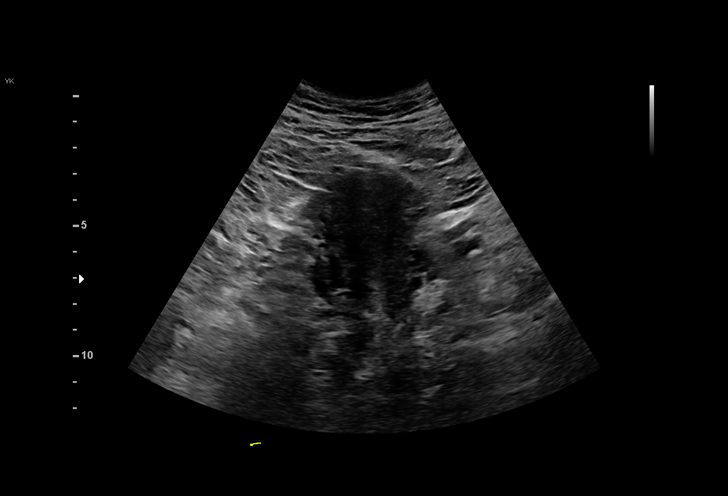
[im 17/73]
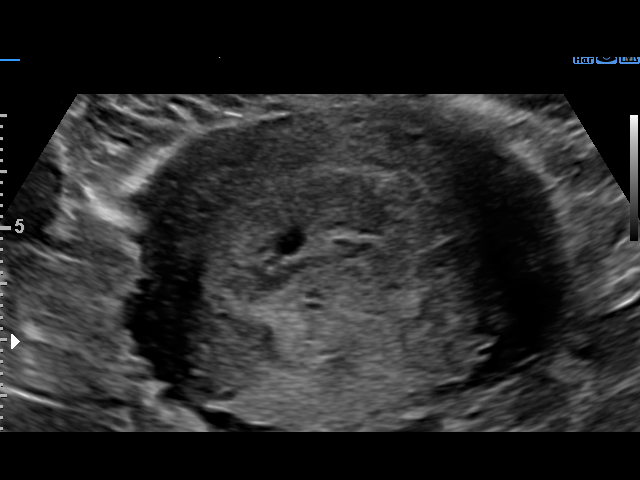
[im 22/73]
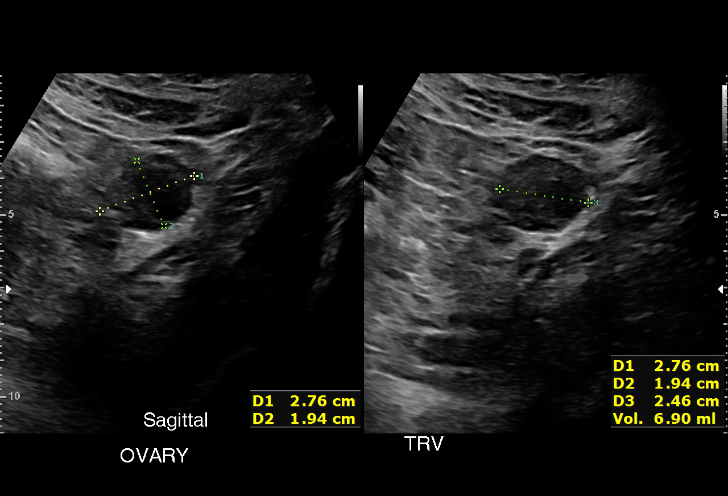
[im 27/73]
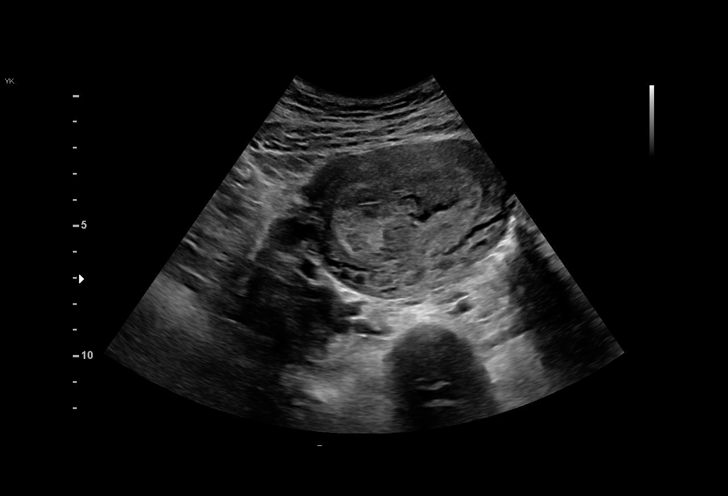
[im 33/73]
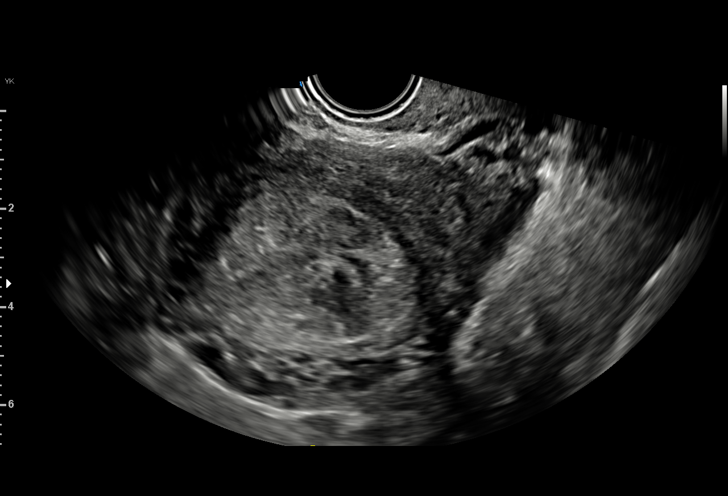
[im 38/73]
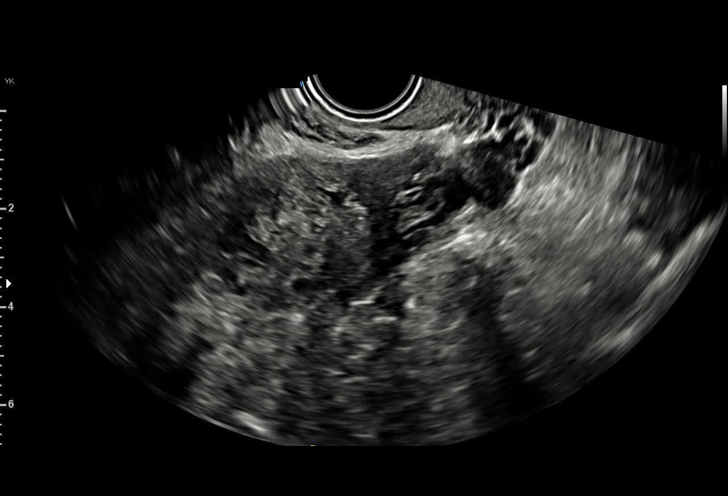
[im 41/73]
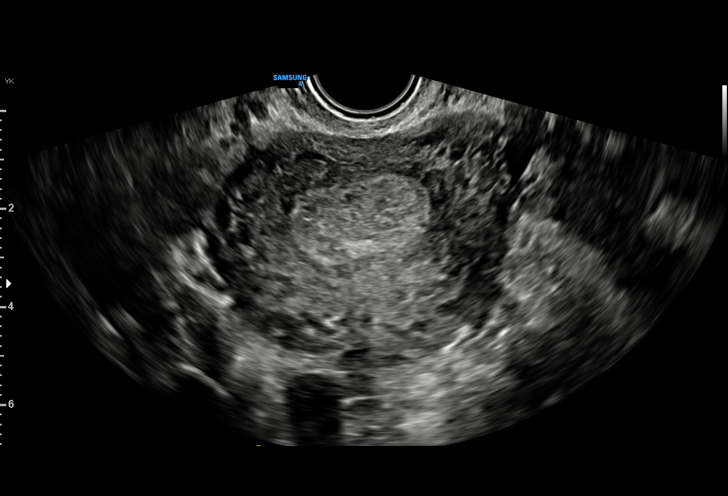
[im 46/73]
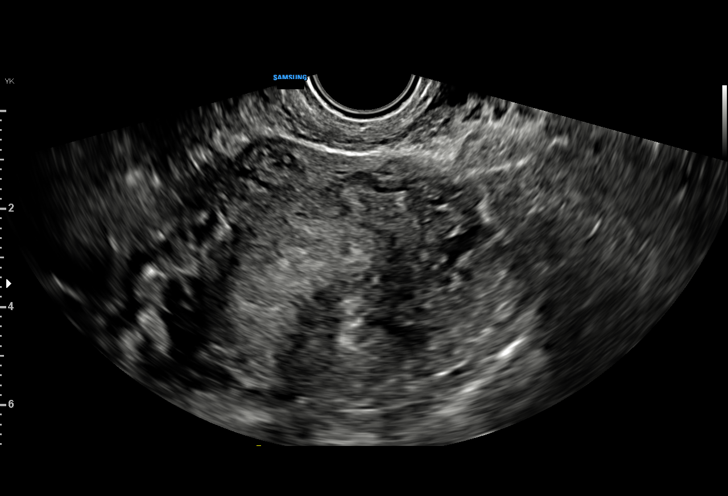
[im 51/73]
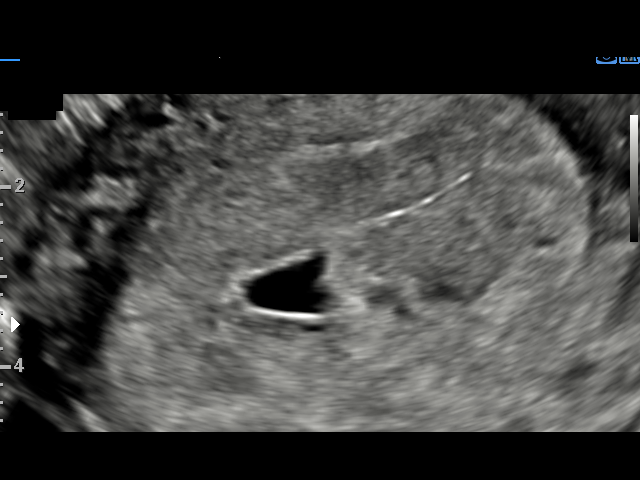
[im 57/73]
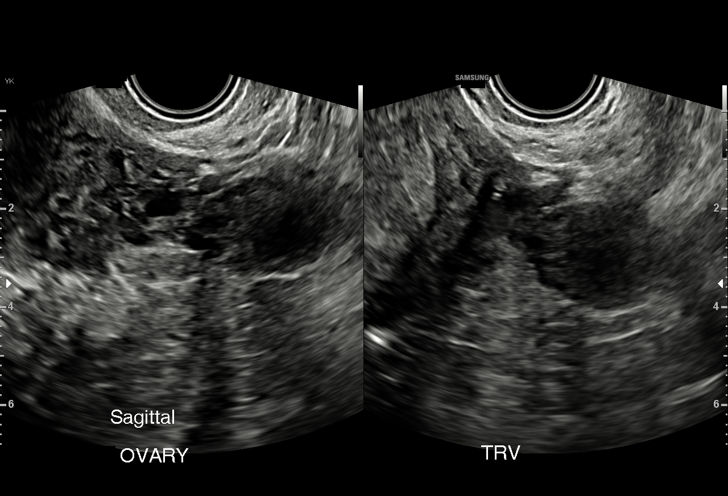
[im 62/73]
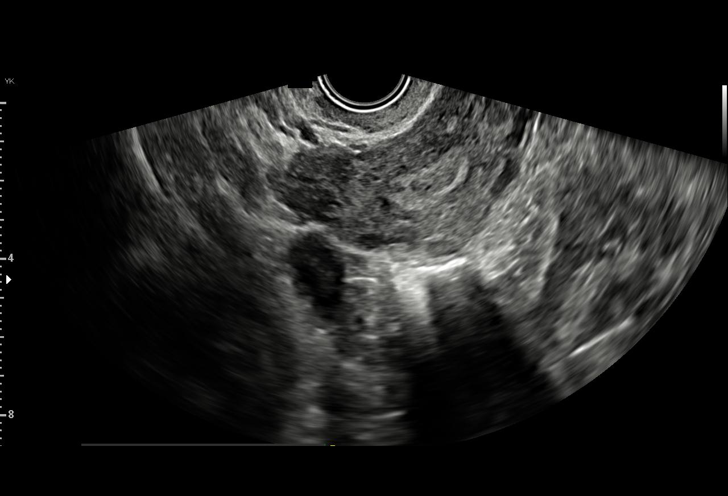
[im 67/73]
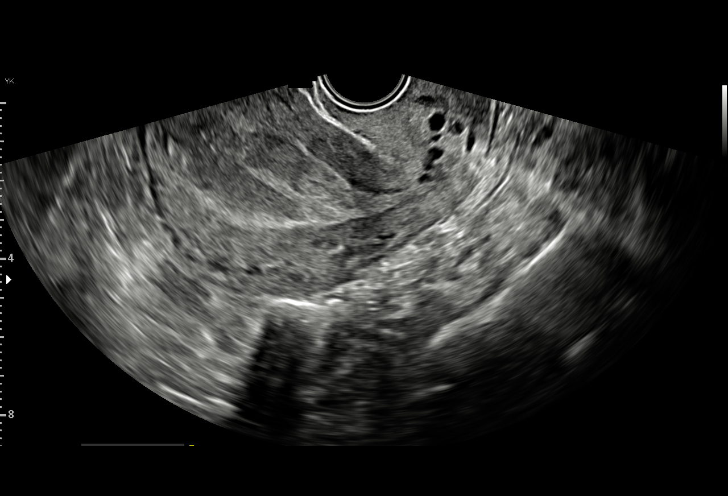
[im 73/73]
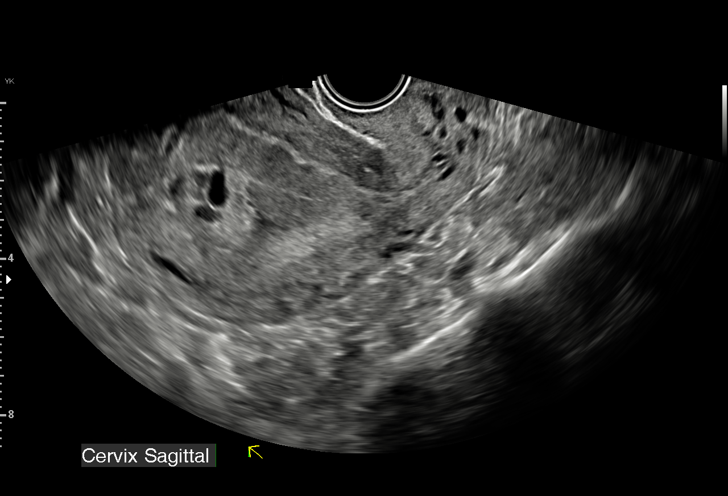

[15 of 28 positions shown; findings below may reference images not displayed]

FINDINGS: Intrauterine gestational sac: Present, single

Yolk sac:  Not identified

Embryo:  Not identified

Cardiac Activity: N/A

Heart Rate: N/A  bpm

MSD: 11.8 mm   5 w   6 d

Subchorionic hemorrhage:  Small subchronic hemorrhage

Maternal uterus/adnexae:

LEFT ovary measures 3.0 x 2.0 x 2.7 cm and contains a small corpus
luteum.

RIGHT ovary normal size and morphology 2.4 x 1.7 x 1.5 cm.

No free pelvic fluid or adnexal masses.
IMPRESSION: Small gestational sac identified within the uterus with small
adjacent subchorionic hemorrhage.

No fetal pole identified to establish viability; may consider
follow-up ultrasound in 14 days to establish viability, if
clinically indicated.

## 2021-07-02 ENCOUNTER — Telehealth: Payer: Self-pay | Admitting: Obstetrics & Gynecology

## 2021-07-02 ENCOUNTER — Ambulatory Visit (INDEPENDENT_AMBULATORY_CARE_PROVIDER_SITE_OTHER): Payer: Self-pay | Admitting: Obstetrics & Gynecology

## 2021-07-02 ENCOUNTER — Other Ambulatory Visit: Payer: Self-pay

## 2021-07-02 ENCOUNTER — Encounter: Payer: Self-pay | Admitting: Obstetrics & Gynecology

## 2021-07-02 DIAGNOSIS — F418 Other specified anxiety disorders: Secondary | ICD-10-CM

## 2021-07-02 DIAGNOSIS — O99345 Other mental disorders complicating the puerperium: Secondary | ICD-10-CM

## 2021-07-02 NOTE — Progress Notes (Signed)
POSTPARTUM VISIT Patient name: Samantha Khan MRN [id number]  Date of birth: Aug 12, 1990 Chief Complaint:   Postpartum Care  History of Present Illness:   Erik Nessel is a 30 y.o. [phone] female being seen today for a postpartum visit. She is 6 weeks postpartum following a spontaneous vaginal delivery at 43w0dgestational weeks. Presented in active labor  Pregnancy uncomplicated.  Last pap smear: 02/2020- negative    Postpartum course has been uncomplicated.  Bleeding no bleeding.  Bladder function is normal. Urinary incontinence? No, fecal incontinence? No Patient is not sexually active. Last sexual activity: prior to delivery.   Desired contraception: Condoms. Patient does want a pregnancy in the future.   Desired family size is not sure children.   Upstream - 07/02/21 1118       Pregnancy Intention Screening   Does the patient want to become pregnant in the next year? No    Does the patient's partner want to become pregnant in the next year? No    Would the patient like to discuss contraceptive options today? No      Contraception Wrap Up   Current Method Abstinence    End Method Female Condom    Contraception Counseling Provided No            The pregnancy intention screening data noted above was reviewed. Potential methods of contraception were discussed. The patient elected to proceed with condoms  Postpartum anxiety-   Edinburgh Postpartum Depression Screening: Positive see plan  Edinburgh Postnatal Depression Scale - 07/02/21 1116       Edinburgh Postnatal Depression Scale:  In the Past 7 Days   I have been able to laugh and see the funny side of things. 1    I have looked forward with enjoyment to things. 1    I have blamed myself unnecessarily when things went wrong. 2    I have been anxious or worried for no good reason. 2    I have felt scared or panicky for no good reason. 1    Things have been getting on top of me. 2    I have been so unhappy that I  have had difficulty sleeping. 1    I have felt sad or miserable. 1    I have been so unhappy that I have been crying. 3    The thought of harming myself has occurred to me. 0    Edinburgh Postnatal Depression Scale Total 14             Baby's course has been uncomplicated. Baby is feeding by breast and bottle: milk supply adequate. Infant has a pediatrician/family doctor? Yes.  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  Pt has material needs met for her and baby: Yes.    Review of Systems:   Pertinent items are noted in HPI Denies Abnormal vaginal discharge w/ itching/odor/irritation, headaches, visual changes, shortness of breath, chest pain, abdominal pain, severe nausea/vomiting, or problems with urination or bowel movements. Pertinent History Reviewed:  Reviewed past medical,surgical, obstetrical and family history.  Reviewed problem list, medications and allergies. OB History  Gravida Para Term Preterm AB Living  '4 3 3   1 3  ' SAB IAB Ectopic Multiple Live Births  1     0 3    # Outcome Date GA Lbr Len/2nd Weight Sex Delivery Anes PTL Lv  4 Term 05/19/21 376w6d0:07 / 00:02 6 lb 12.3 oz (3.07 kg) F Vag-Spont None  LIV  3 SAB 03/18/20 [redacted]w[redacted]d        2 Term 01/21/18 416w0d7 lb 14 oz (3.572 kg) F Vag-Spont EPI N LIV  1 Term 12/02/12 4139w3d:45 / 01:22 8 lb 14 oz (4.026 kg) M Vag-Spont EPI N LIV   Physical Assessment:   Vitals:   07/02/21 1112  BP: 93/60  Pulse: 66  Weight: 166 lb (75.3 kg)  Height: '4\' 11"'  (1.499 m)  Body mass index is 33.53 kg/m.       Physical Examination:   General appearance: alert, well appearing, and in no distress  Mental status: normal mood- became tearful when discussing her anxiety  Skin: warm & dry   Cardiovascular: normal heart rate noted, RRR  Respiratory: normal respiratory effort, no distress   Breasts: no masses or evidence of infection, no axillary lymphadenopathy bilaterally  Abdomen: soft, non-tender    Pelvic: normal external genitalia, vulva, vagina, cervix, uterus and adnexa  Rectal: no hemorrhoids  Extremities: no edema  Chaperone: Angel Neas         No results found for this or any previous visit (from the past 24 hour(s)).  Assessment & Plan:  1) Postpartum exam 2) Constipation -ok to continue with stool softener -reviewed conservative options  3) breast & bottle feeding 4) Depression screening- reviewed her concerns and reassured pt that postpartum adjustment can be challenging.   -Encouraged pt to use family resources when possible.  Discussed alternative options including online support groups, [facility]. -discussed medication which she declined at this time 5) Contraception counseling- for now using condoms due to insurance.  May reconsider in the future  Essential components of care per ACOG recommendations:  . Infant care and feeding:  If breastfeeding, discussed returning to work, pumping, breastfeeding-associated pain, guidance regarding return to fertility while lactating if not using another method Recommended that all caregivers be immunized for flu, pertussis and other preventable communicable diseases   Sexuality, contraception and birth spacing Provided guidance regarding sexuality, management of dyspareunia, and resumption of intercourse Discussed avoiding interpregnancy interval <6mt66mand recommended birth spacing of 18 months  Sleep and fatigue Discussed coping options for fatigue and sleep disruption Encouraged family/partner/community support of 4 hrs of uninterrupted sleep to help with mood and fatigue  5. Physical recovery  Patient is safe to resume physical activity. Discussed attainment of healthy weight.  6. Health maintenance Mammogram at 40yo3yoearlier if indicated Pap smears as indicated, up to date  Meds: No orders of the defined types were placed in this encounter.   Follow-up: Return in  about 1 year (around 07/02/2022) for Annual.  Pt also encouraged to make f/u appointment should she note worsening anxiety  No orders of the defined types were placed in this encounter.  JennJanyth Pupa Attending [facility] [employer]

## 2021-07-02 NOTE — Telephone Encounter (Signed)
Patient agreed to have virtual visit with Behavioral Clinician. She would like to know how much the visit will be before she agrees to the visit.

## 2021-07-02 NOTE — Addendum Note (Signed)
Addended by: Sharon Seller on: 07/02/2021 02:00 PM   Modules accepted: Orders

## 2021-07-03 ENCOUNTER — Ambulatory Visit: Payer: Self-pay | Admitting: Clinical

## 2021-07-03 DIAGNOSIS — F4321 Adjustment disorder with depressed mood: Secondary | ICD-10-CM

## 2021-07-03 NOTE — Patient Instructions (Addendum)
° ° ° °  Postpartum Support Groups in [facility] Mom Talk: This mom-led group offers support and connection to mothers as they journey through the adjustments and struggles of that overwhelming first year after the birth of a child. A member of the [facility] staff will be present to share resources and additional support as you seek to care for yourself and baby. It is held at [facility] at 10:00am Tuesday mornings, and at 6:00pm Thursday evenings. Babies welcome. No registration or fee. (Breastfeeding Support Group follows at 11:00am Tuesday mornings and 7:00pm each Monday evening).  [facility]-F[facility] Hearts, First Wednesday of each month at noon (feel free to bring a bagged lunch), Third Tuesday of each month at 6:30pm. [facility], 5th Floor [facility] Classroom [address], [address], [place], [place]               Contact [place], [place], [place], [place] at [phone]  Postpartum Support International(PSI) Louie Bun at (727) 466-2213 The PSI Louie Bun is a toll-free telephone number anyone can call to get basic information, support, and resources. Dial extension 1 for Spanish and extension 2 for English. The Warmline messages are returned every day of the week. You are welcome to leave a confidential message any time, and one of the warmline volunteers will return your call as soon as possible. If you are not able to talk when the volunteer calls you, you can arrange another time to connect. The volunteer will give you information, encouragement, and names of resources near you.The PSI Warmline is not a crisis hotline and does not handle emergencies. People in crisis should call their physicians, their local emergency number or the National Suicide Prevention Hotline at 1-800-273-TALK 916-347-6750). Apoyo de PSI para las familias hispano parlantes (279) 829-1024, #1 Llame al nmero de telfono gratuito para obtener recursos, apoyo e informacin gratuita. Djenos  un mensaje y un voluntario le devolver la llamada.  Postpartum Support International(PSI) online support groups at www.postpartum.net   Suicide Prevention Hotline: 7157958340

## 2021-07-03 NOTE — BH Specialist Note (Signed)
Integrated Behavioral Health via Telemedicine Visit w/ Intern   07/03/2021 Zina Pitzer [id number]  Number of Integrated Behavioral Health visits: 1 Session Start time: 8:41 am  Session End time: 9:11 am Total time: 30  Referring Provider: Myna Hidalgo, DO Patient/Family location: Home [facility] Provider location: [facility] All persons participating in visit: Intern and patient Types of Service: Individual psychotherapy and Video visit  I connected with Samantha Khan and/or [name] patient via  Telephone or Engineer, civil (consulting)  (Video is Surveyor, mining) and verified that I am speaking with the correct person using two identifiers. Discussed confidentiality: Yes   I discussed the limitations of telemedicine and the availability of in person appointments.  Discussed there is a possibility of technology failure and discussed alternative modes of communication if that failure occurs.  I discussed that engaging in this telemedicine visit, they consent to the provision of behavioral healthcare and the services will be billed under their insurance.  Patient and/or legal guardian expressed understanding and consented to Telemedicine visit: Yes   Presenting Concerns: Patient and/or family reports the following symptoms/concerns: Sadness and exhaustion Duration of problem: Since giving birth; Severity of problem: mild  Patient and/or Family's Strengths/Protective Factors: Concrete supports in place (healthy food, safe environments, etc.) and Parental Resilience  Goals Addressed: Patient will:  Reduce symptoms of: depression   Increase knowledge and/or ability of: coping skills, healthy habits, and self-management skills   Demonstrate ability to: Increase healthy adjustment to current life circumstances  Progress towards Goals: Ongoing  Interventions: Interventions utilized:  Solution-Focused Strategies and Supportive  Counseling Standardized Assessments completed:  Did not screen   Flowsheet Row Routine Prenatal from 02/21/2021 in [facility]  PHQ-9 Total Score 7        Patient and/or Family Response: Patient was eager to share today and talk about current life stressors. She was open and excited about finding support groups.  Assessment: Patient currently experiencing Sadness and fatigue.   Patient may benefit from post partum support groups for Spanish speaking women.  Plan: Follow up with behavioral health clinician on : 1/11 @ 3:15 pm Behavioral recommendations: building coping skills, joining virtual support groups. Referral(s):  None at the moment  I discussed the assessment and treatment plan with the patient and/or parent/guardian. They were provided an opportunity to ask questions and all were answered. They agreed with the plan and demonstrated an understanding of the instructions.   They were advised to call back or seek an in-person evaluation if the symptoms worsen or if the condition fails to improve as anticipated.  De Hollingshead

## 2021-07-16 ENCOUNTER — Ambulatory Visit: Payer: Self-pay | Admitting: Physician Assistant

## 2021-07-16 ENCOUNTER — Encounter: Payer: Self-pay | Admitting: Physician Assistant

## 2021-07-16 VITALS — BP 97/60 | HR 62 | Temp 97.1°F | Wt 165.0 lb

## 2021-07-16 DIAGNOSIS — Z0000 Encounter for general adult medical examination without abnormal findings: Secondary | ICD-10-CM

## 2021-07-16 NOTE — Progress Notes (Signed)
BP 97/60    Pulse 62    Temp (!) 97.1 F (36.2 C)    Wt 165 lb (74.8 kg)    SpO2 98%    BMI 33.33 kg/m    Subjective:    Patient ID: Samantha Khan, female    DOB: 1990/08/07, 31 y.o.   MRN: [id number]  HPI: Samantha Khan is a 31 y.o. female presenting on 07/16/2021 for Annual Exam   HPI   Chief Complaint  Patient presents with   Annual Exam     She had a  baby november 13.  She has had No menses since then.  She is using condoms for pregnancy prevention.   She says she had Appt with nutritionist who recomended legendary milk and fenugreek.    She is getting counseing now for post-partum depression.  She says It is helping.    She is sleeping a little bit.   She 3 other children.  She says she doesn't want any more but no one talked about that with her after her baby was born before she left the hospital.       Relevant past medical, surgical, family and social history reviewed and updated as indicated. Interim medical history since our last visit reviewed. Allergies and medications reviewed and updated.     Current Outpatient Medications:    ferrous sulfate 325 (65 FE) MG tablet, Take 1 tablet (325 mg total) by mouth every other day., Disp: 45 tablet, Rfl: 2   Prenatal Vit-Fe Fumarate-FA (PRENATAL VITAMIN PO), Take by mouth., Disp: , Rfl:      Current Outpatient Medications:    ferrous sulfate 325 (65 FE) MG tablet, Take 1 tablet (325 mg total) by mouth every other day., Disp: 45 tablet, Rfl: 2   Prenatal Vit-Fe Fumarate-FA (PRENATAL VITAMIN PO), Take by mouth., Disp: , Rfl:    Review of Systems  Per HPI unless specifically indicated above     Objective:    BP 97/60    Pulse 62    Temp (!) 97.1 F (36.2 C)    Wt 165 lb (74.8 kg)    SpO2 98%    BMI 33.33 kg/m   Wt Readings from Last 3 Encounters:  07/16/21 165 lb (74.8 kg)  07/02/21 166 lb (75.3 kg)  05/20/21 182 lb (82.6 kg)    Physical Exam Vitals reviewed.  Constitutional:      General: She  is not in acute distress.    Appearance: She is well-developed. She is not ill-appearing.  HENT:     Head: Normocephalic and atraumatic.     Right Ear: Tympanic membrane, ear canal and external ear normal.     Left Ear: Tympanic membrane, ear canal and external ear normal.  Eyes:     Extraocular Movements: Extraocular movements intact.     Conjunctiva/sclera: Conjunctivae normal.     Pupils: Pupils are equal, round, and reactive to light.  Neck:     Thyroid: No thyromegaly.  Cardiovascular:     Rate and Rhythm: Normal rate and regular rhythm.  Pulmonary:     Effort: Pulmonary effort is normal.     Breath sounds: Normal breath sounds.  Abdominal:     General: Bowel sounds are normal.     Palpations: Abdomen is soft. There is no mass.     Tenderness: There is no abdominal tenderness.  Musculoskeletal:     Cervical back: Neck supple.     Right lower leg: No edema.  Left lower leg: No edema.  Lymphadenopathy:     Cervical: No cervical adenopathy.  Skin:    General: Skin is warm and dry.  Neurological:     Mental Status: She is alert and oriented to person, place, and time.     Motor: No weakness or tremor.     Gait: Gait is intact. Gait normal.     Deep Tendon Reflexes:     Reflex Scores:      Patellar reflexes are 2+ on the right side and 2+ on the left side. Psychiatric:        Attention and Perception: Attention normal.        Speech: Speech normal.        Behavior: Behavior normal. Behavior is cooperative.           Assessment & Plan:   Encounter Diagnosis  Name Primary?   Visit for annual health examination Yes     -social services was called by pt coordinator.   Pt needs to call her social services advocate (pt knows who this is) to discuss family planning medicaid.  Pt is told to contact this office if she is unable to get family planning that she needs -pt to continue with her MH counselor.  She is made aware that [facility] now has counselor.  She will call  if she is unable to continue with her current counselor for some reason -pt to follow up 1 year.  She is to contact office sooner prn

## 2021-07-17 ENCOUNTER — Ambulatory Visit: Payer: Self-pay | Admitting: Clinical

## 2021-07-17 DIAGNOSIS — F4321 Adjustment disorder with depressed mood: Secondary | ICD-10-CM

## 2021-07-17 NOTE — BH Specialist Note (Addendum)
Integrated Behavioral Health via Telemedicine Visit w/ Intern  07/17/2021 Samantha Khan [id number]  Number of Integrated Behavioral Health visits: 2 Session Start time: 3:15 pm  Session End time: 3:50 pm Total time: 40   Referring Provider: Myna Hidalgo, DO Patient/Family location: Home  [facility] Provider location: [facility] All persons participating in visit: Patient and intern Types of Service: Individual psychotherapy  I connected with Samantha Khan and/or Samantha Khan patient via  Telephone or Engineer, civil (consulting)  (Video is Surveyor, mining) and verified that I am speaking with the correct person using two identifiers. Discussed confidentiality: Yes   I discussed the limitations of telemedicine and the availability of in person appointments.  Discussed there is a possibility of technology failure and discussed alternative modes of communication if that failure occurs.  I discussed that engaging in this telemedicine visit, they consent to the provision of behavioral healthcare and the services will be billed under their insurance.  Patient and/or legal guardian expressed understanding and consented to Telemedicine visit: Yes   Presenting Concerns: Patient and/or family reports the following symptoms/concerns: Post partum depression Duration of problem: 7 weeks; Severity of problem: mild  Patient and/or Family's Strengths/Protective Factors: Concrete supports in place (healthy food, safe environments, etc.)  Goals Addressed: Patient will:  Reduce symptoms of: depression   Increase knowledge and/or ability of: stress reduction   Demonstrate ability to: Increase healthy adjustment to current life circumstances  Progress towards Goals: Ongoing  Interventions: Interventions utilized:  Supportive Counseling Standardized Assessments completed: Not Needed  Patient and/or Family Response: Patient was receptive of plan put in place. She is  also interested in possibly taking medication to help with her emotions.   Assessment: Patient currently experiencing depression and anger triggered by stress/being overwhelmed.   Patient may benefit from medication and support groups.  Plan: Follow up with behavioral health clinician on : 07/25/2021 Behavioral recommendations: attend support groups, work on self regulation skills, and find hobbies. Referral(s): Integrated [employer] (In Clinic)  I discussed the assessment and treatment plan with the patient and/or parent/guardian. They were provided an opportunity to ask questions and all were answered. They agreed with the plan and demonstrated an understanding of the instructions.   They were advised to call back or seek an in-person evaluation if the symptoms worsen or if the condition fails to improve as anticipated. Edinburgh Postnatal Depression Scale Screening Tool 07/02/2021 05/21/2021 01/22/2018 01/22/2018  I have been able to laugh and see the funny side of things. 1 0 0 (No Data)  I have looked forward with enjoyment to things. 1 1 0 -  I have blamed myself unnecessarily when things went wrong. 2 2 0 -  I have been anxious or worried for no good reason. 2 2 1  -  I have felt scared or panicky for no good reason. 1 0 0 -  Things have been getting on top of me. 2 2 0 -  I have been so unhappy that I have had difficulty sleeping. 1 1 0 -  I have felt sad or miserable. 1 1 0 -  I have been so unhappy that I have been crying. 3 1 0 -  The thought of harming myself has occurred to me. 0 0 0 -  Edinburgh Postnatal Depression Scale Total 14 10 1  -     

## 2021-08-14 ENCOUNTER — Ambulatory Visit: Payer: Self-pay | Admitting: Clinical

## 2021-08-14 ENCOUNTER — Other Ambulatory Visit: Payer: Self-pay

## 2021-08-14 DIAGNOSIS — F4321 Adjustment disorder with depressed mood: Secondary | ICD-10-CM

## 2021-08-14 NOTE — BH Specialist Note (Signed)
Integrated Behavioral Health via Telemedicine Visit  08/14/2021 Samantha Khan [id number]  Number of [place] Clinician visits: No data recorded 3 Session Start time: No data recorded 10:25 am Session End time: No data recorded Total time in minutes: No data recorded   Referring Provider: [name], DO Patient/Family location: Home [facility] Provider location: [facility] All persons participating in visit: Patient, Intern, & Interpreter Types of Service: Individual psychotherapy  I connected with Samantha Khan and/or Samantha Khan  n/a  via  Telephone or Video Enabled Telemedicine Application  (Video is Caregility application) and verified that I am speaking with the correct person using two identifiers. Discussed confidentiality: Yes   I discussed the limitations of telemedicine and the availability of in person appointments.  Discussed there is a possibility of technology failure and discussed alternative modes of communication if that failure occurs.  I discussed that engaging in this telemedicine visit, they consent to the provision of behavioral healthcare and the services will be billed under their insurance.  Patient and/or legal guardian expressed understanding and consented to Telemedicine visit: Yes   Presenting Concerns: Patient and/or family reports the following symptoms/concerns: Depression Duration of problem: about 2 months; Severity of problem: moderate  Patient and/or Family's Strengths/Protective Factors: Social connections and Concrete supports in place (healthy food, safe environments, etc.)  Goals Addressed: Patient will:  Reduce symptoms of: depression   Increase knowledge and/or ability of: coping skills, self-management skills, and stress reduction   Demonstrate ability to: Increase healthy adjustment to current life circumstances and Increase adequate support systems for patient/family  Progress towards  Goals: Ongoing  Interventions: Interventions utilized:  Solution-Focused Strategies and CBT Cognitive Behavioral Therapy Standardized Assessments completed: PHQ [place] from 08/14/2021 in [facility] [place] [facility]  PHQ-9 Total Score 10       Patient and/or Family Response: Patient was receptive to treatment and is looking forward to continuing.  Assessment: Patient currently experiencing depression and stress.   Patient may benefit from starting medication for depression.  Plan: Follow up with behavioral health clinician on : 2/22 @ 11:15am Behavioral recommendations: continue with [facility] intern therapist and start medication for depression. Referral(s): [place] (In Clinic)  I discussed the assessment and treatment plan with the patient and/or parent/guardian. They were provided an opportunity to ask questions and all were answered. They agreed with the plan and demonstrated an understanding of the instructions.   They were advised to call back or seek an in-person evaluation if the symptoms worsen or if the condition fails to improve as anticipated.  Samantha Khan

## 2021-08-14 NOTE — Patient Instructions (Addendum)
° ° ° ° ° ° °  New Mom Support Groups  www.conehealthybaby.com (Mom Talk, Breastfeeding Support, and Baby & Me groups)  EntertainmentBlogging.es (Find a group that works best for you)  www.postpartum.net (click on the "Get Help" tab, and you can scroll down for lots of additional resources)

## 2021-08-15 ENCOUNTER — Telehealth: Payer: Self-pay | Admitting: Obstetrics & Gynecology

## 2021-08-15 DIAGNOSIS — F53 Postpartum depression: Secondary | ICD-10-CM

## 2021-08-15 MED ORDER — SERTRALINE HCL 50 MG PO TABS
50.0000 mg | ORAL_TABLET | Freq: Every day | ORAL | 6 refills | Status: DC
Start: 2021-08-15 — End: 2022-03-03

## 2021-08-15 NOTE — Telephone Encounter (Signed)
Pt being seen today by behavioral health services.  Pt meets criteria to start medication and is agreeable to do so.  Rx sent in.  Will plan for 6-8wks follow up in our office.  Rx: zoloft sent in  Communication with Hulda Marin, LCSW  Myna Hidalgo, DO Attending Obstetrician & Gynecologist, [facility] for [facility], [facility]

## 2021-08-28 ENCOUNTER — Ambulatory Visit: Payer: Self-pay | Admitting: Clinical

## 2021-08-28 DIAGNOSIS — F4321 Adjustment disorder with depressed mood: Secondary | ICD-10-CM

## 2021-08-28 NOTE — BH Specialist Note (Signed)
Integrated Behavioral Health Follow Up In-Person Visit  MRN: [id number] Name: Samantha Khan  Number of [place] Clinician visits: 4- Fourth Visit  Session Start time: [id number]   Session End time: 1010  Total time in minutes: 25   Types of Service: Individual psychotherapy  Interpretor:Yes.   Interpretor Name and Language: Spanish  Subjective: Samantha Khan is a 31 y.o. female accompanied by  n/a Patient was referred by [name], DO for high depression screening. Patient reports the following symptoms/concerns: Sadness Duration of problem: since before birth of child; Severity of problem: mild  Objective: Mood: Depressed and Affect: Appropriate Risk of harm to self or others: No plan to harm self or others  Life Context: Family and Social: Lives with children and parents Life Changes: Birth of child  Patient and/or Family's Strengths/Protective Factors: Social connections and Concrete supports in place (healthy food, safe environments, etc.)  Goals Addressed: Patient will:  Reduce symptoms of: depression   Increase knowledge and/or ability of: coping skills and stress reduction   Demonstrate ability to: Increase healthy adjustment to current life circumstances and Increase motivation to adhere to plan of care  Progress towards Goals: Ongoing  Interventions: Interventions utilized:  Medication Monitoring and Supportive Counseling Standardized Assessments completed: Not Needed  Patient and/or Family Response: Patient agrees with treatment plan.  Patient Centered Plan: Patient is on the following Treatment Plan(s): Zoloft for depression and continued IBH sessions Assessment: Patient currently experiencing sadness, has decreased since beginning medication.   Patient may benefit from continuing use of medication and BH sessions.  Plan: Follow up with behavioral health clinician on : 09/18/21. Behavioral recommendations: following treatment  plan Referral(s): [facility] (In Clinic) "From scale of 1-10, how likely are you to follow plan?": [place]

## 2021-09-18 ENCOUNTER — Ambulatory Visit: Payer: Self-pay | Admitting: Clinical

## 2021-09-18 ENCOUNTER — Other Ambulatory Visit: Payer: Self-pay

## 2021-09-18 DIAGNOSIS — F4321 Adjustment disorder with depressed mood: Secondary | ICD-10-CM

## 2021-09-18 NOTE — BH Specialist Note (Signed)
Integrated Behavioral Health Follow Up In-Person Visit ? ?MRN: [id number] ?Name: Samantha Khan ? ?Number of Integrated Behavioral Health Clinician visits: 5th ?Session Start time: 1045   ?Session End time: 1117 ?Total time in minutes: 32 ? ?Types of Service: Telephone visit ? ?Interpretor:Yes.   Interpretor Name and Language: Spanish ? ?Subjective: ?Samantha Khan is a 31 y.o. female accompanied by  n/a ?Patient was referred by Jacquelin Hawking, PA-C for high depression screening.Marland Kitchen ?Patient reports the following symptoms/concerns: depression  ?Duration of problem: since before birth of child; Severity of problem: mild ? ?Objective: ?Mood: Depressed and Affect: Appropriate ?Risk of harm to self or others: No plan to harm self or others ? ?Life Context: ?Family and Social: Lives with husband, parents, and children ?Self-Care: n/a ?Life Changes: birth of new child ? ?Patient and/or Family's Strengths/Protective Factors: ?Social connections and Concrete supports in place (healthy food, safe environments, etc.) ? ?Goals Addressed: ?Patient will: ? Reduce symptoms of: depression  ? Increase knowledge and/or ability of: coping skills and healthy habits  ? Demonstrate ability to: Increase healthy adjustment to current life circumstances ? ?Progress towards Goals: ?Ongoing and Achieved ? ?Interventions: ?Interventions utilized:  CBT Cognitive Behavioral Therapy and Supportive Counseling ?Standardized Assessments completed: Not Needed ? ?Patient and/or Family Response: Patient agrees with and follows treatment plan ? ?Patient Centered Plan: ?Patient is on the following Treatment Plan(s): increased self-care, medication compliance, and continued BH services ?Assessment: ?Patient currently experiencing improvement. Patient reports that she is doing a lot better and is now receiving tons of support from family. She is still sleep deprived, but improving.  ? ?Patient may benefit from continued [facility] services and added  self-care. ? ?Plan: ?Follow up with behavioral health clinician on : 10/03/21 ?Behavioral recommendations: continued BH services and self care ?Referral(s): Integrated [employer] [facility] (In Clinic) ?"From scale of 1-10, how likely are you to follow plan?": 10 ? ?Samantha Khan ? ?

## 2021-09-26 ENCOUNTER — Encounter: Payer: Self-pay | Admitting: Obstetrics & Gynecology

## 2021-09-26 ENCOUNTER — Other Ambulatory Visit: Payer: Self-pay

## 2021-09-26 ENCOUNTER — Ambulatory Visit (INDEPENDENT_AMBULATORY_CARE_PROVIDER_SITE_OTHER): Payer: Self-pay | Admitting: Obstetrics & Gynecology

## 2021-09-26 VITALS — BP 91/55 | HR 56 | Ht 59.0 in | Wt 163.6 lb

## 2021-09-26 DIAGNOSIS — N6459 Other signs and symptoms in breast: Secondary | ICD-10-CM

## 2021-09-26 DIAGNOSIS — F418 Other specified anxiety disorders: Secondary | ICD-10-CM

## 2021-09-26 DIAGNOSIS — O99345 Other mental disorders complicating the puerperium: Secondary | ICD-10-CM

## 2021-09-26 MED ORDER — HYDROXYZINE HCL 25 MG PO TABS: 25.0000 mg | ORAL_TABLET | Freq: Every evening | ORAL | 2 refills | Status: AC | PRN

## 2021-09-26 NOTE — Progress Notes (Signed)
? ?  GYN VISIT ?Patient name: Samantha Khan MRN [id number]  Date of birth: 18-Dec-1990 ?Chief Complaint:   ?Follow-up ? ?History of Present Illness:   ?Samantha Khan is a 31 y.o. [phone]  female being seen today for follow up regarding: ? ?Postpartum anxiety:  ? ?Taking zoloft and does feel like it's helped.  During the night still having trouble sleeping.  She notes that her mind is still racing.  Mood significantly improved.  Pt has also been seen by [facility] services. ? ?Note improvement of PHQ-9 ? ?Contraception: condoms ? ?Notes some pain in her left breast with breast feeing.  Denies redness or mass.  No fever or chills.  Declined exam ? ?No LMP recorded (lmp unknown). (Menstrual status: Lactating). ? ? ?  09/26/2021  ?  8:59 AM 08/14/2021  ? 10:29 AM 02/21/2021  ?  9:25 AM 11/15/2020  ? 11:34 AM 07/21/2017  ?  9:43 AM  ?Depression screen PHQ 2/9  ?Decreased Interest 1 2 2 2  0  ?Down, Depressed, Hopeless 1 2 1 1  0  ?PHQ - 2 Score 2 4 3 3  0  ?Altered sleeping 1 2 3 2  0  ?Tired, decreased energy 1 1 1 2 1   ?Change in appetite 0 1 0 0 0  ?Feeling bad or failure about yourself  1 2 0 0 0  ?Trouble concentrating 0 0 0 0 0  ?Moving slowly or fidgety/restless 0 0 0 0 0  ?Suicidal thoughts 0 0 0 0 0  ?PHQ-9 Score 5 10 7 7 1   ? ? ? ?Review of Systems:   ?Pertinent items are noted in HPI ?Denies fever/chills, dizziness, headaches, visual disturbances, fatigue, shortness of breath, chest pain, abdominal pain, vomiting, no problems with periods, bowel movements, urination, or intercourse unless otherwise stated above.  ?Pertinent History Reviewed:  ?Reviewed past medical,surgical, social, obstetrical and family history.  ?Reviewed problem list, medications and allergies. ?Physical Assessment:  ? ?Vitals:  ? 09/26/21 0855  ?BP: (!) 91/55  ?Pulse: (!) 56  ?Weight: 163 lb 9.6 oz (74.2 kg)  ?Height: 4\' 11"  (1.499 m)  ?Body mass index is 33.04 kg/m?. ? ?     Physical Examination:  ? General appearance: alert, well appearing, and in no  distress ? Psych: mood appropriate, normal affect ? Skin: warm & dry  ? Cardiovascular: normal heart rate noted ? Respiratory: normal respiratory effort, no distress ?  ?Chaperone: N/A   ? ?Assessment & Plan:  ?1) Postpartum anxiety ?-doing well with zoloft 50mg  dialy ?-plan to add Atarax at night time ?-f/u in 6-8wks ? ?2) Breast engorgement ?-reviewed conservative treatment ?-f/u if worsening of symptoms ? ? ?Meds ordered this encounter  ?Medications  ? hydrOXYzine (ATARAX) 25 MG tablet  ?  Sig: Take 1 tablet (25 mg total) by mouth at bedtime as needed for anxiety.  ?  Dispense:  90 tablet  ?  Refill:  2  ? ? ? ? ?Return in about 6 weeks (around 11/07/2021) for Medication follow up. ? ? ? , DO ?Attending Obstetrician & Gynecologist, Faculty Practice ?Center for , [facility] ? ? ? ?

## 2021-10-03 ENCOUNTER — Ambulatory Visit: Payer: Self-pay | Admitting: Clinical

## 2021-10-03 DIAGNOSIS — Z91199 Patient's noncompliance with other medical treatment and regimen due to unspecified reason: Secondary | ICD-10-CM

## 2021-10-03 NOTE — BH Specialist Note (Signed)
Patient did not show up to the appointment. I have attempted to contact this patient by phone with the following results: no answer. ? ?Samantha Khan  ?

## 2021-10-16 ENCOUNTER — Ambulatory Visit: Payer: Self-pay | Admitting: Clinical

## 2021-10-16 DIAGNOSIS — F4321 Adjustment disorder with depressed mood: Secondary | ICD-10-CM

## 2021-10-16 NOTE — BH Specialist Note (Signed)
Integrated Behavioral Health via Telemedicine Visit ? ?10/16/2021 ?Samantha Khan ?[id number] ? ?Number of Integrated Behavioral Health Clinician visits: 6-Sixth Visit ? ?Session Start time: 1139 ?  ?Session End time: 1208 ? ?Total time in minutes: 29 ? ? ?Referring Provider: Jacquelin Hawking, PA-C ?Patient/Family location: Home  ?[facility] Provider location: [facility] ?All persons participating in visit: BH Intern and Patient ?Types of Service: Video visit ? ?I connected with Samantha Khan and/or Samantha Khan  n/a  via  Telephone or Video Enabled Telemedicine Application  (Video is Caregility application) and verified that I am speaking with the correct person using two identifiers. Discussed confidentiality: Yes  ? ?I discussed the limitations of telemedicine and the availability of in person appointments.  Discussed there is a possibility of technology failure and discussed alternative modes of communication if that failure occurs. ? ?I discussed that engaging in this telemedicine visit, they consent to the provision of behavioral healthcare and the services will be billed under their insurance. ? ?Patient and/or legal guardian expressed understanding and consented to Telemedicine visit: Yes  ? ?Presenting Concerns: ?Patient and/or family reports the following symptoms/concerns: depression ?Duration of problem: 6 months; Severity of problem: mild ? ?Patient and/or Family's Strengths/Protective Factors: ?Social connections, Social and Emotional competence, and Concrete supports in place (healthy food, safe environments, etc.) ? ?Goals Addressed: ?Patient will: ? Reduce symptoms of: depression  ? Increase knowledge and/or ability of: coping skills and healthy habits  ? Demonstrate ability to: Increase healthy adjustment to current life circumstances ? ?Progress towards Goals: ?Ongoing ? ?Interventions: ?Interventions utilized:  Supportive Counseling and Sleep Hygiene ?Standardized Assessments completed:  Not Needed ? ?Patient and/or Family Response: Patient agrees with treatment plan and is doing well. ? ?Assessment: ?Patient currently experiencing decreasing depression symptoms.  ? ?Patient may benefit from continued [facility] sessions and medication management. ? ?Plan: ?Follow up with behavioral health clinician on : 4/20 ?Behavioral recommendations: continued BH and medication management ?Referral(s): Integrated [employer] (In Clinic) ? ?I discussed the assessment and treatment plan with the patient and/or parent/guardian. They were provided an opportunity to ask questions and all were answered. They agreed with the plan and demonstrated an understanding of the instructions. ?  ?They were advised to call back or seek an in-person evaluation if the symptoms worsen or if the condition fails to improve as anticipated. ? ?De Hollingshead ?

## 2021-10-18 ENCOUNTER — Emergency Department (HOSPITAL_COMMUNITY)
Admission: EM | Admit: 2021-10-18 | Discharge: 2021-10-19 | Disposition: A | Discharge status: Home / Self Care | Payer: Self-pay | Attending: Emergency Medicine | Admitting: Emergency Medicine

## 2021-10-18 ENCOUNTER — Encounter (HOSPITAL_COMMUNITY): Payer: Self-pay | Admitting: *Deleted

## 2021-10-18 ENCOUNTER — Ambulatory Visit
Admission: EM | Admit: 2021-10-18 | Discharge: 2021-10-18 | Disposition: A | Discharge status: Home / Self Care | Payer: Self-pay | Attending: Urgent Care | Admitting: Urgent Care

## 2021-10-18 ENCOUNTER — Emergency Department (HOSPITAL_COMMUNITY): Payer: Self-pay

## 2021-10-18 DIAGNOSIS — R197 Diarrhea, unspecified: Secondary | ICD-10-CM | POA: Insufficient documentation

## 2021-10-18 DIAGNOSIS — R1084 Generalized abdominal pain: Secondary | ICD-10-CM | POA: Insufficient documentation

## 2021-10-18 DIAGNOSIS — R100 Acute abdomen: Secondary | ICD-10-CM

## 2021-10-18 DIAGNOSIS — R1032 Left lower quadrant pain: Secondary | ICD-10-CM

## 2021-10-18 DIAGNOSIS — K760 Fatty (change of) liver, not elsewhere classified: Secondary | ICD-10-CM

## 2021-10-18 DIAGNOSIS — Z7982 Long term (current) use of aspirin: Secondary | ICD-10-CM | POA: Insufficient documentation

## 2021-10-18 DIAGNOSIS — N200 Calculus of kidney: Secondary | ICD-10-CM

## 2021-10-18 LAB — POC URINE PREG, ED: Preg Test, Ur: NEGATIVE

## 2021-10-18 LAB — URINALYSIS, ROUTINE W REFLEX MICROSCOPIC
Bilirubin Urine: NEGATIVE
Glucose, UA: NEGATIVE mg/dL
Hgb urine dipstick: NEGATIVE
Ketones, ur: NEGATIVE mg/dL
Nitrite: NEGATIVE
Protein, ur: NEGATIVE mg/dL
Specific Gravity, Urine: 1.026 (ref 1.005–1.030)
pH: 6 (ref 5.0–8.0)

## 2021-10-18 LAB — COMPREHENSIVE METABOLIC PANEL
ALT: 42 U/L (ref 0–44)
AST: 29 U/L (ref 15–41)
Albumin: 4.2 g/dL (ref 3.5–5.0)
Alkaline Phosphatase: 99 U/L (ref 38–126)
Anion gap: 7 (ref 5–15)
BUN: 16 mg/dL (ref 6–20)
CO2: 26 mmol/L (ref 22–32)
Calcium: 9.3 mg/dL (ref 8.9–10.3)
Chloride: 108 mmol/L (ref 98–111)
Creatinine, Ser: 0.59 mg/dL (ref 0.44–1.00)
GFR, Estimated: >60 mL/min (ref >60)
Glucose, Bld: 96 mg/dL (ref 70–99)
Potassium: 3.7 mmol/L (ref 3.5–5.1)
Sodium: 141 mmol/L (ref 135–145)
Total Bilirubin: 0.4 mg/dL (ref 0.3–1.2)
Total Protein: 7.4 g/dL (ref 6.5–8.1)

## 2021-10-18 LAB — CBC
HCT: 43.2 % (ref 36.0–46.0)
Hemoglobin: 14.7 g/dL (ref 12.0–15.0)
MCH: 32.5 pg (ref 26.0–34.0)
MCHC: 34 g/dL (ref 30.0–36.0)
MCV: 95.6 fL (ref 80.0–100.0)
Platelets: 310 10*3/uL (ref 150–400)
RBC: 4.52 MIL/uL (ref 3.87–5.11)
RDW: 12.1 % (ref 11.5–15.5)
WBC: 7.1 10*3/uL (ref 4.0–10.5)
nRBC: 0 % (ref 0.0–0.2)

## 2021-10-18 LAB — LIPASE, BLOOD: Lipase: 40 U/L (ref 11–51)

## 2021-10-18 MED ORDER — KETOROLAC TROMETHAMINE 30 MG/ML IJ SOLN
30.0000 mg | Freq: Once | INTRAMUSCULAR | Status: AC
  Administered 2021-10-18: 30 mg via INTRAMUSCULAR

## 2021-10-18 MED ORDER — IOHEXOL 300 MG/ML  SOLN
100.0000 mL | Freq: Once | INTRAMUSCULAR | Status: AC | PRN
  Administered 2021-10-19: 100 mL via INTRAVENOUS

## 2021-10-18 NOTE — ED Notes (Signed)
Pt given pads for her breasts due to milk leakage ?

## 2021-10-18 NOTE — ED Provider Notes (Signed)
?[place] ?Provider Note ? ? ?CSN: [id number] ?Arrival date & time: 10/18/21  1702 ? ?  ? ?History ?Chief Complaint  ?Patient presents with  ? Abdominal Pain  ? ? ?Samantha Khan is a 31 y.o. female who presents to the emergency department with diffuse abdominal pain that has been ongoing and worsening over the last 10 days.  Patient has had normal bowel movements apart from 3 days ago where she has been having some increased frequency of diarrhea.  Patient was seen and evaluated at urgent care today and sent here for CT scan of the abdomen.  She denies any nausea, vomiting, fever, chills.  Patient is currently breast-feeding her 31-month-old baby.  This is her third child.  She denies any urinary complaints.  She has been taking Alka-Seltzer.  She is also decreased chocolate and caffeine intake with little improvement. ? ? ?Abdominal Pain ? ?  ? ?Home Medications ?Prior to Admission medications   ?Medication Sig Start Date End Date Taking? Authorizing Provider  ?aspirin-sod bicarb-citric acid (ALKA-SELTZER) 325 MG TBEF tablet Take 325 mg by mouth every 6 (six) hours as needed.   Yes [provider]  ?bismuth subsalicylate (PEPTO BISMOL) 262 MG/15ML suspension Take 30 mLs by mouth every 6 (six) hours as needed for indigestion.   Yes [provider]  ?ferrous sulfate 325 (65 FE) MG tablet Take 1 tablet (325 mg total) by mouth every other day. 11/16/20  Yes Roma Schanz, CNM  ?hydrOXYzine (ATARAX) 25 MG tablet Take 1 tablet (25 mg total) by mouth at bedtime as needed for anxiety. 09/26/21 12/25/21 Yes [name], DO  ?Prenatal Vit-Fe Fumarate-FA (PRENATAL VITAMIN PO) Take by mouth.   Yes [provider]  ?sertraline (ZOLOFT) 50 MG tablet Take 1 tablet (50 mg total) by mouth daily. 08/15/21 10/18/21 Yes [name], DO  ?   ? ?Allergies    ?Patient has no known allergies.   ? ?Review of Systems   ?Review of Systems  ?Gastrointestinal:  Positive for abdominal pain.   ?All other systems reviewed and are negative. ? ?Physical Exam ?Updated Vital Signs ?BP 103/67 (BP Location: Right Arm)   Pulse (!) 58   Temp 99 ?F (37.2 ?C)   Resp 20   Ht 4\' 10"  (1.473 m)   Wt 74.4 kg   LMP  (LMP Unknown)   SpO2 98%   BMI 34.28 kg/m?  ?Physical Exam ?Vitals and nursing note reviewed.  ?Constitutional:   ?   General: She is not in acute distress. ?   Appearance: Normal appearance.  ?HENT:  ?   Head: Normocephalic and atraumatic.  ?Eyes:  ?   General:     ?   Right eye: No discharge.     ?   Left eye: No discharge.  ?Cardiovascular:  ?   Comments: Regular rate and rhythm.  S1/S2 are distinct without any evidence of murmur, rubs, or gallops.  Radial pulses are 2+ bilaterally.  Dorsalis pedis pulses are 2+ bilaterally.  No evidence of pedal edema. ?Pulmonary:  ?   Comments: Clear to auscultation bilaterally.  Normal effort.  No respiratory distress.  No evidence of wheezes, rales, or rhonchi heard throughout. ?Abdominal:  ?   General: Abdomen is flat. Bowel sounds are normal. There is no distension.  ?   Palpations: Abdomen is soft.  ?   Tenderness: There is abdominal tenderness. There is no guarding or rebound.  ?Musculoskeletal:     ?   General: Normal  range of motion.  ?   Cervical back: Neck supple.  ?Skin: ?   General: Skin is warm and dry.  ?   Findings: No rash.  ?Neurological:  ?   General: No focal deficit present.  ?   Mental Status: She is alert.  ?Psychiatric:     ?   Mood and Affect: Mood normal.     ?   Behavior: Behavior normal.  ? ? ?ED Results / Procedures / Treatments   ?Labs ?(all labs ordered are listed, but only abnormal results are displayed) ?Labs Reviewed  ?URINALYSIS, ROUTINE W REFLEX MICROSCOPIC - Abnormal; Notable for the following components:  ?    Result Value  ? APPearance HAZY (*)   ? Leukocytes,Ua SMALL (*)   ? Bacteria, UA RARE (*)   ? All other components within normal limits  ?LIPASE, BLOOD  ?COMPREHENSIVE METABOLIC PANEL  ?CBC  ?POC URINE PREG, ED   ? ? ?EKG ?None ? ?Radiology ?No results found. ? ?Procedures ?Procedures  ? ? ?Medications Ordered in ED ?Medications  ?iohexol (OMNIPAQUE) 300 MG/ML solution 100 mL (has no administration in time range)  ? ? ?ED Course/ Medical Decision Making/ A&P ?  ?                        ?Medical Decision Making ?Amount and/or Complexity of Data Reviewed ?Labs: ordered. ?Radiology: ordered. ? ?Risk ?Prescription drug management. ? ? ?This patient presents to the ED for concern of abdominal pain, this involves an extensive number of treatment options, and is a complaint that carries with it a high risk of complications and morbidity.  The differential diagnosis includes appendicitis, diverticulitis, GERD, pyelonephritis ? ? ?Co morbidities that complicate the patient evaluation ? ?Past Medical History:  ?Diagnosis Date  ? Abnormal Pap smear of cervix   ? Anal fissure 02/2013  ? Medical history non-contributory   ? Pregnancy   ? Supervision of normal pregnancy 07/21/2017  ?  [place] Results Initiated care at 13wk Pap  04/17/2016 negative RCHD  Dating by 1st trimester U/S 11wk GC/CT Initial:             -/-       36wks:     -/- Support Person [place] NT/IT:    neg Flu vaccine   CF: 07/20/12 neg        SMA:                Sickle Cell:07/20/12 neg Tdap vaccine Recommended ~28wks  5/29 Blood type  O+               Rhogam:    Antibody Negativ  ? ? ?Additional history obtained: ? ?Additional history obtained from nursing note ?External records from outside source obtained and reviewed including urgent care note where she was seen evaluated prior to arrival.  Questionable acute abdomen at urgent care.  I did not elicit this on my exam his abdomen is soft and mildly tender to deep palpation diffusely. ? ? ?Lab Tests: ? ?I Ordered, and personally interpreted labs.  The pertinent results include: CBC is normal.  CMP is normal.  Pregnancy is negative.  Urinalysis without evidence of infection.  Lipase  normal. ? ? ?Imaging Studies ordered: ? ?I ordered imaging studies including CT abdomen pelvis with contrast which is still pending ? ? ?Cardiac Monitoring: ? ?The patient was maintained on a cardiac monitor.  I personally viewed and interpreted the  cardiac monitored which showed an underlying rhythm of: Normal sinus rhythm ? ? ?Medicines ordered and prescription drug management: ? ?None ? ? ?Test Considered: ? ?N/A ? ? ?Critical Interventions: ? ?N/A ? ? ?Problem List / ED Course: ? ?Patient coming into the emergency department today with a 10-day history of constant diffuse abdominal pain.  Patient's abdomen is soft and nondistended.  There is mild tenderness to deep palpation throughout the lower abdomen Labs are all reassuring.  I would low suspicion for pancreatitis at this time.  Patient does not have a white count to maybe suggest for any surgical abdomen at this time.  Patient has not required any pain medication in the department.  There is been a significant delay in CT imaging as there were several emergencies in a short timeframe.  CT abdomen is thus still pending.  If CT is negative I believe the patient can likely go home.  The rest of her care will be transferred to Dr. Eulis Foster at shift change.  ? ?Reevaluation: ? ?After the interventions noted above, I reevaluated the patient and found that they have :stayed the same ? ? ?Social Determinants of Health: ? ?Social Determinants of Health with Concerns  ? ?Physical Activity: Inactive  ? Days of Exercise per Week: 3 days  ? Minutes of Exercise per Session: 0 min  ?Stress: Stress Concern Present  ? Feeling of Stress : To some extent  ?Depression (PHQ2-9): Medium Risk  ? PHQ-2 Score: 5  ? ? ?Dispostion: ? ?After consideration of the diagnostic results and the patients response to treatment, I feel that the patent would benefit from outpatient follow-up if CT is negative. ? ?Final Clinical Impression(s) / ED Diagnoses ?Final diagnoses:  ?None  ? ? ?Rx / DC  Orders ?ED Discharge Orders   ? ? None  ? ?  ? ? ?  ?Hendricks Limes, PA-C ?10/18/21 2335 ? ?  ?Daleen Bo, MD ?10/19/21 1101 ? ?

## 2021-10-18 NOTE — Discharge Instructions (Addendum)
Please report to the hospital now as I suspect you are having an acute abdomen, possible diverticulitis. This requires a higher level of care than we can provide in the urgent care setting including tests such as a CT scan. Please go to the emergency room now.  ?

## 2021-10-18 NOTE — ED Notes (Signed)
Pharmacy at bedside

## 2021-10-18 NOTE — ED Triage Notes (Signed)
Pt reports pain around umbilical area x 1 1/2 week; diarrhea x 2 days. Pt tried [employer], Gas relief OTC, Alka Seltzer without relief. Denies constipation, nausea, emesis, fever.  ?

## 2021-10-18 NOTE — ED Provider Notes (Signed)
?[facility] ? ? ?MRN: [id number] DOB: [birth year] ? ?Subjective:  ? ?Samantha Khan is a 31 y.o. female presenting for 10-day history of acute onset worsening abdominal pain over the lower side.  Currently she rates it an 8 out of 10, constant.  Has had 1-2 episodes of diarrhea per day for the past 2 days.  No fever, nausea, vomiting, constipation, bloody stools, history of GI issues.  Diet routine has been the same.  She is actually trying to be eating a lot healthier lately.  She is hydrating well.  Currently she is breast-feeding.  Patient reports that she does not want to be tested for pregnancy as she uses condoms and has regular periods. ? ?No current facility-administered medications for this encounter. ? ?Current Outpatient Medications:  ?  ferrous sulfate 325 (65 FE) MG tablet, Take 1 tablet (325 mg total) by mouth every other day., Disp: 45 tablet, Rfl: 2 ?  hydrOXYzine (ATARAX) 25 MG tablet, Take 1 tablet (25 mg total) by mouth at bedtime as needed for anxiety., Disp: 90 tablet, Rfl: 2 ?  Prenatal Vit-Fe Fumarate-FA (PRENATAL VITAMIN PO), Take by mouth., Disp: , Rfl:  ?  sertraline (ZOLOFT) 50 MG tablet, Take 1 tablet (50 mg total) by mouth daily., Disp: 30 tablet, Rfl: 6  ? ?No Known Allergies ? ?Past Medical History:  ?Diagnosis Date  ? Abnormal Pap smear of cervix   ? Anal fissure 02/2013  ? Medical history non-contributory   ? Pregnancy   ? Supervision of normal pregnancy 07/21/2017  ?  [facility] Results Initiated care at 13wk Pap  04/17/2016 negative RCHD  Dating by 1st trimester U/S 11wk GC/CT Initial:             -/-       36wks:     -/- Support Person Samantha Khan [employer] NT/IT:    neg Flu vaccine   CF: 07/20/12 neg        SMA:                Sickle Cell:07/20/12 neg Tdap vaccine Recommended ~28wks  5/29 Blood type  O+               Rhogam:    Antibody Negativ  ?  ? ?Past Surgical History:  ?Procedure Laterality Date  ? COLPOSCOPY  03/2011  ? NO PAST SURGERIES     ? ? ?Family History  ?Problem Relation Age of Onset  ? Hypertension Maternal Grandmother   ? Diabetes Maternal Grandmother   ? Diabetes Paternal Grandmother   ? Diabetes Paternal Grandfather   ? Diabetes Maternal Grandfather   ? ? ?Social History  ? ?Tobacco Use  ? Smoking status: Never  ? Smokeless tobacco: Never  ?Vaping Use  ? Vaping Use: Never used  ?Substance Use Topics  ? Alcohol use: No  ? Drug use: No  ? ? ?ROS ? ? ?Objective:  ? ?Vitals: ?BP 102/64 (BP Location: Right Arm)   Pulse 61   Temp 98.5 ?F (36.9 ?C) (Oral)   Resp 18   LMP  (LMP Unknown)   SpO2 95%   Breastfeeding Yes  ? ?Physical Exam ?Constitutional:   ?   General: She is not in acute distress. ?   Appearance: Normal appearance. She is well-developed. She is not ill-appearing, toxic-appearing or diaphoretic.  ?HENT:  ?   Head: Normocephalic and atraumatic.  ?   Nose: Nose normal.  ?   Mouth/Throat:  ?   Mouth:  Mucous membranes are moist.  ?   Pharynx: Oropharynx is clear.  ?Eyes:  ?   General: No scleral icterus.    ?   Right eye: No discharge.     ?   Left eye: No discharge.  ?   Extraocular Movements: Extraocular movements intact.  ?   Conjunctiva/sclera: Conjunctivae normal.  ?Cardiovascular:  ?   Rate and Rhythm: Normal rate.  ?   Heart sounds: No murmur heard. ?  No friction rub. No gallop.  ?Pulmonary:  ?   Effort: Pulmonary effort is normal. No respiratory distress.  ?   Breath sounds: No stridor. No wheezing, rhonchi or rales.  ?Chest:  ?   Chest wall: No tenderness.  ?Abdominal:  ?   General: Bowel sounds are normal. There is no distension.  ?   Palpations: Abdomen is soft. There is no mass.  ?   Tenderness: There is abdominal tenderness (worse over left lower quadrant) in the right lower quadrant and left lower quadrant. There is no right CVA tenderness, left CVA tenderness, guarding or rebound.  ?Skin: ?   General: Skin is warm and dry.  ?Neurological:  ?   General: No focal deficit present.  ?   Mental Status: She is alert and  oriented to person, place, and time.  ?Psychiatric:     ?   Mood and Affect: Mood normal.     ?   Behavior: Behavior normal.     ?   Thought Content: Thought content normal.     ?   Judgment: Judgment normal.  ? ? ?IM Toradol at 30 mg in clinic for her severe abdominal pain. ? ?Assessment and Plan :  ? ?PDMP not reviewed this encounter. ? ?1. Acute abdomen   ?2. LLQ pain   ? ?Physical exam findings warrant further evaluation and intervention through the emergency room including consideration for CT scan of the abdomen pelvis.  Patient was given Toradol as above for pain control.  She contracts for safety and will present to the emergency room now. ?  ?Wallis Bamberg, PA-C ?10/18/21 1652 ? ?

## 2021-10-18 NOTE — ED Triage Notes (Signed)
Abdominal pain for the past 10 days, diarrhea x 3 days ?

## 2021-10-19 NOTE — Discharge Instructions (Signed)
There were no serious problems found on the CAT scan.  The CAT scan did show some mild enlargement of the liver and spleen with fatty changes of the liver.  These are not serious problems.  There was also a small stone, 2 mm, in the right kidney.  Currently this is not causing any problems.  It may end up exiting your kidney and causing pain sometime in the future.  To help your pain use Tylenol every 4 hours as needed.  If you need to take something for diarrhea, use Imodium.  Make sure that you are drinking plenty of water.  Stay on a low fiber diet. ?

## 2021-10-19 NOTE — ED Provider Notes (Signed)
?  Face-to-face evaluation ? ? ?History: She complains of abdominal pain, mid abdomen, periumbilical for several days.  She is having increased frequency of stools and the stool is loose, brown in color.  She was seen in urgent care today who suggested that she come here for further care and treatment.  She is currently breast-feeding.  Her delivery was 5 months ago.  She has not had problems like this before.  She denies fever, vomiting, weakness or dizziness.  There has been no change in urine, such as frequency or dysuria. ? ?Physical exam: Alert and somewhat overweight female.  Her abdomen is soft and nontender to palpation. ? ?MDM: Evaluation for  ?Chief Complaint  ?Patient presents with  ? Abdominal Pain  ?  ? ?Abdominal pain, nonspecific, with diarrhea.  No red flags.  CT ordered to evaluate for further problems.  Normal labs.  Urinalysis appears contaminated, not indicative of UTI. ? ?Medical screening examination/treatment/procedure(s) were conducted as a shared visit with non-physician practitioner(s) and myself.  I personally evaluated the patient during the encounter ? ?  ?Daleen Bo, MD ?10/19/21 1101 ? ?

## 2021-10-24 ENCOUNTER — Ambulatory Visit: Payer: Self-pay | Admitting: Clinical

## 2021-10-24 DIAGNOSIS — F4321 Adjustment disorder with depressed mood: Secondary | ICD-10-CM

## 2021-10-24 NOTE — BH Specialist Note (Signed)
[facility] Intern met briefly with patient to discuss receiving services going forward. Patient was given information for the [facility] to contact for further therapeutic services. [facility] intern informed patient that she is still able to reach out to [facility] if she has any questions.  ? ?Burna Forts ?

## 2021-11-07 ENCOUNTER — Ambulatory Visit (INDEPENDENT_AMBULATORY_CARE_PROVIDER_SITE_OTHER): Payer: Self-pay | Admitting: Obstetrics & Gynecology

## 2021-11-07 ENCOUNTER — Encounter: Payer: Self-pay | Admitting: Obstetrics & Gynecology

## 2021-11-07 VITALS — BP 87/58 | HR 57 | Ht 59.0 in | Wt 159.6 lb

## 2021-11-07 DIAGNOSIS — F419 Anxiety disorder, unspecified: Secondary | ICD-10-CM

## 2021-11-07 NOTE — Addendum Note (Signed)
Addended by: Hulda Marin C on: 11/07/2021 09:56 AM ? ? Modules accepted: Orders ? ?

## 2021-11-07 NOTE — Progress Notes (Signed)
? ?  GYN VISIT ?Patient name: Samantha Khan MRN [id number]  Date of birth: 06-09-1991 ?Chief Complaint:   ?Follow-up ? ?History of Present Illness:   ?Samantha Khan is a 31 y.o. [phone] female being seen today for follow up regarding anxiety ? ?-currently on zoloft 50mg  daily and atarax prn ? ?Notes that she does feel like this is helping and is somewhat able to sleep better. She also notes that she is more patient and less quick to lose her temper.  Denies SI/HI.  Sounds as though she is still struggling with adjusting to the new baby, but doing better.  She is interested in following up with [facility], but states she does not have a way to reach them.    ? ?She reports no other acute complaints ? ?No LMP recorded (lmp unknown). (Menstrual status: Lactating). ? ? ?  11/07/2021  ?  9:05 AM 09/26/2021  ?  8:59 AM 08/14/2021  ? 10:29 AM 02/21/2021  ?  9:25 AM 11/15/2020  ? 11:34 AM  ?Depression screen PHQ 2/9  ?Decreased Interest 0 1 2 2 2   ?Down, Depressed, Hopeless 0 1 2 1 1   ?PHQ - 2 Score 0 2 4 3 3   ?Altered sleeping 2 1 2 3 2   ?Tired, decreased energy 1 1 1 1 2   ?Change in appetite 0 0 1 0 0  ?Feeling bad or failure about yourself  1 1 2  0 0  ?Trouble concentrating 0 0 0 0 0  ?Moving slowly or fidgety/restless 0 0 0 0 0  ?Suicidal thoughts 0 0 0 0 0  ?PHQ-9 Score 4 5 10 7 7   ? ? ? ?Review of Systems:   ?Pertinent items are noted in HPI ?Denies fever/chills, dizziness, headaches, visual disturbances, fatigue, shortness of breath, chest pain, abdominal pain, vomiting, no problems with periods, bowel movements, urination, or intercourse unless otherwise stated above.  ?Pertinent History Reviewed:  ?Reviewed past medical,surgical, social, obstetrical and family history.  ?Reviewed problem list, medications and allergies. ?Physical Assessment:  ? ?Vitals:  ? 11/07/21 0906 11/07/21 0907  ?BP: (!) 77/50 (!) 87/58  ?Pulse: (!) 57 (!) 57  ?Weight: 159 lb 9.6 oz (72.4 kg)   ?Height: 4\' 11"  (1.499 m)   ?Body mass index is 32.24  kg/m?. ? ?     Physical Examination:  ? General appearance: alert, well appearing, and in no distress ? Psych: mood appropriate, normal affect ? Skin: warm & dry  ? Cardiovascular: normal heart rate noted ? Respiratory: normal respiratory effort, no distress ?  ?Chaperone: N/A   ? ?Assessment & Plan:  ?1) Anxiety ?-continue with current medication ?-f/u in 67mos or 33yr for annual ? ? ?Return for Medication follow up, with Dr. . ? ? ? , DO ?Attending Obstetrician & Gynecologist, Faculty Practice ?Center for , [facility] ? ? ? ?

## 2021-12-19 ENCOUNTER — Telehealth: Payer: Self-pay | Admitting: Clinical

## 2021-12-19 NOTE — Telephone Encounter (Signed)
error 

## 2022-03-03 ENCOUNTER — Other Ambulatory Visit: Payer: Self-pay | Admitting: *Deleted

## 2022-03-03 DIAGNOSIS — F53 Postpartum depression: Secondary | ICD-10-CM

## 2022-03-03 MED ORDER — SERTRALINE HCL 50 MG PO TABS: 50.0000 mg | ORAL_TABLET | Freq: Every day | ORAL | 6 refills | Status: DC

## 2022-07-08 ENCOUNTER — Encounter: Payer: Self-pay | Admitting: Emergency Medicine

## 2022-07-08 ENCOUNTER — Ambulatory Visit
Admission: EM | Admit: 2022-07-08 | Discharge: 2022-07-08 | Disposition: A | Discharge status: Home / Self Care | Payer: Self-pay | Attending: Urgent Care | Admitting: Urgent Care

## 2022-07-08 DIAGNOSIS — G5602 Carpal tunnel syndrome, left upper limb: Secondary | ICD-10-CM

## 2022-07-08 MED ORDER — NAPROXEN 500 MG PO TABS: 500.0000 mg | ORAL_TABLET | Freq: Two times a day (BID) | ORAL | 0 refills | Status: DC

## 2022-07-08 NOTE — ED Provider Notes (Signed)
[facility] - URGENT [facility]  Note:  This document was prepared using Systems analyst and may include unintentional dictation errors.  MRN: [id number] DOB: August 20, 1990  Subjective:   Samantha Khan is a 32 y.o. female presenting for 3-week history of persistent numbness and tingling, pain in the past few days of the left second through fourth fingers.  No swelling, warmth, erythema.  Patient does a lot of housework.  No current facility-administered medications for this encounter.  Current Outpatient Medications:    ferrous sulfate 325 (65 FE) MG tablet, Take 1 tablet (325 mg total) by mouth every other day., Disp: 45 tablet, Rfl: 2   Prenatal Vit-Fe Fumarate-FA (PRENATAL VITAMIN PO), Take by mouth., Disp: , Rfl:    sertraline (ZOLOFT) 50 MG tablet, Take 1 tablet (50 mg total) by mouth daily., Disp: 30 tablet, Rfl: 6   No Known Allergies  Past Medical History:  Diagnosis Date   Abnormal Pap smear of cervix    Anal fissure 02/2013   Medical history non-contributory    Pregnancy    Supervision of normal pregnancy 07/21/2017    [place] Results Initiated care at 13wk Pap  04/17/2016 negative RCHD  Dating by 1st trimester U/S 11wk GC/CT Initial:             -/-       36wks:     -/- Support Person [place] NT/IT:    neg Flu vaccine   CF: 07/20/12 neg        SMA:                Sickle Cell:07/20/12 neg Tdap vaccine Recommended ~28wks  5/29 Blood type  O+               Rhogam:    Antibody Negativ     Past Surgical History:  Procedure Laterality Date   COLPOSCOPY  03/2011   NO PAST SURGERIES      Family History  Problem Relation Age of Onset   Hypertension Maternal Grandmother    Diabetes Maternal Grandmother    Diabetes Paternal Grandmother    Diabetes Paternal Grandfather    Diabetes Maternal Grandfather     Social History   Tobacco Use   Smoking status: Never   Smokeless tobacco: Never  Vaping Use   Vaping Use: Never  used  Substance Use Topics   Alcohol use: No   Drug use: No    ROS   Objective:   Vitals: BP (!) 114/49 (BP Location: Right Arm)   Pulse 80   Temp 97.9 F (36.6 C) (Oral)   Resp 18   LMP 06/02/2022 (Approximate)   SpO2 98%   Breastfeeding Yes   Physical Exam Constitutional:      General: She is not in acute distress.    Appearance: Normal appearance. She is well-developed. She is not ill-appearing, toxic-appearing or diaphoretic.  HENT:     Head: Normocephalic and atraumatic.     Nose: Nose normal.     Mouth/Throat:     Mouth: Mucous membranes are moist.  Eyes:     General: No scleral icterus.       Right eye: No discharge.        Left eye: No discharge.     Extraocular Movements: Extraocular movements intact.  Cardiovascular:     Rate and Rhythm: Normal rate.  Pulmonary:     Effort: Pulmonary effort is normal.  Musculoskeletal:  Hands:  Skin:    General: Skin is warm and dry.  Neurological:     General: No focal deficit present.     Mental Status: She is alert and oriented to person, place, and time.  Psychiatric:        Mood and Affect: Mood normal.        Behavior: Behavior normal.     Assessment and Plan :   PDMP not reviewed this encounter.  1. Carpal tunnel syndrome of left wrist     Recommended wearing a carpal tunnel brace, using naproxen and modifying work activities.  Follow-up with [facility] for further management including consultation regarding physical therapy or more aggressive management including procedural management. Counseled patient on potential for adverse effects with medications prescribed/recommended today, ER and return-to-clinic precautions discussed, patient verbalized understanding.    Jaynee Eagles, [place] 07/08/22 [phone]

## 2022-07-08 NOTE — ED Triage Notes (Signed)
Numbness to left index and middle finger x 3 weeks.  When moving fingers, she started to feel pain.

## 2022-07-16 ENCOUNTER — Ambulatory Visit: Payer: Self-pay | Admitting: Physician Assistant

## 2022-07-16 ENCOUNTER — Encounter: Payer: Self-pay | Admitting: Physician Assistant

## 2022-07-16 VITALS — BP 100/57 | HR 72 | Temp 97.2°F | Ht 59.0 in | Wt 166.2 lb

## 2022-07-16 DIAGNOSIS — F39 Unspecified mood [affective] disorder: Secondary | ICD-10-CM

## 2022-07-16 DIAGNOSIS — N949 Unspecified condition associated with female genital organs and menstrual cycle: Secondary | ICD-10-CM

## 2022-07-16 DIAGNOSIS — Z0000 Encounter for general adult medical examination without abnormal findings: Secondary | ICD-10-CM

## 2022-07-16 DIAGNOSIS — G5602 Carpal tunnel syndrome, left upper limb: Secondary | ICD-10-CM

## 2022-07-16 MED ORDER — SERTRALINE HCL 100 MG PO TABS: 100.0000 mg | ORAL_TABLET | Freq: Every day | ORAL | 3 refills | Status: DC

## 2022-07-16 NOTE — Progress Notes (Signed)
BP (!) 100/57   Pulse 72   Temp (!) 97.2 F (36.2 C)   Ht 4\' 11"  (1.499 m)   Wt 166 lb 3.2 oz (75.4 kg)   LMP 06/02/2022 (Approximate)   SpO2 96%   BMI 33.57 kg/m    Subjective:    Patient ID: Samantha Khan, female    DOB: [birth year], 32 y.o.   MRN: [id number]  HPI: Samantha Khan is a 32 y.o. female presenting on 07/16/2022 for Annual Exam, Numbness (Pt went to urgent care for numbness on fingers on L hand and was told to use a brace at night which she has been doing nightly, but has not noticed any difference. ), and Mass (Pt states she notices a bump in her vagina after having her last child Nov. 2022. Pt states not painful.)   HPI  Chief Complaint  Patient presents with   Annual Exam   Numbness    Pt went to urgent care for numbness on fingers on L hand and was told to use a brace at night which she has been doing nightly, but has not noticed any difference.    Mass    Pt states she notices a bump in her vagina after having her last child Nov. 2022. Pt states not painful.     When asked about her mood, she says she is A little bit better with sadness.   She says she had a Change about 8 months ago- she changed from feeling sad to feeling angry.   She says she is angry all the time.  She has no thoughts of hurting herself or others.   She thinks the sertraline helps   she wonders if it needs to be increased.  She has been taking it longer than 6 months.  She is using condoms for contraception   Pt reports a Bump vagina inside- noticed about a month ago.  She has No pain from the bump.  She has No pain during intercourse.     She was recently diagnosed with carpal tunnel when she went to Urgent care last week.  She Wears brace at night   she is taking naproxen.  She Doesn't ice it.  She hasn't noticed any improvement yet.    She has 3 children- 9 4 and 1  When asked if she exercises, she says She walks sometimes, maybe 20-30 minutes 2 days/week.         Relevant  past medical, surgical, family and social history reviewed and updated as indicated. Interim medical history since our last visit reviewed. Allergies and medications reviewed and updated.     Current Outpatient Medications:    naproxen (NAPROSYN) 500 MG tablet, Take 1 tablet (500 mg total) by mouth 2 (two) times daily with a meal., Disp: 30 tablet, Rfl: 0   Prenatal Vit-Fe Fumarate-FA (PRENATAL VITAMIN PO), Take by mouth., Disp: , Rfl:    sertraline (ZOLOFT) 50 MG tablet, Take 1 tablet (50 mg total) by mouth daily., Disp: 30 tablet, Rfl: 6     Review of Systems  Per HPI unless specifically indicated above     Objective:    BP (!) 100/57   Pulse 72   Temp (!) 97.2 F (36.2 C)   Ht 4\' 11"  (1.499 m)   Wt 166 lb 3.2 oz (75.4 kg)   LMP 06/02/2022 (Approximate)   SpO2 96%   BMI 33.57 kg/m   Wt Readings from Last 3 Encounters:  07/16/22 166 lb 3.2 oz (  75.4 kg)  11/07/21 159 lb 9.6 oz (72.4 kg)  10/18/21 164 lb (74.4 kg)     Vision Screening   Right eye Left eye Both eyes  Without correction     With correction 20/25 20/25 20/25        Physical Exam Vitals reviewed. Exam conducted with a chaperone present.  Constitutional:      General: She is not in acute distress.    Appearance: She is well-developed. She is not ill-appearing or toxic-appearing.  HENT:     Head: Normocephalic and atraumatic.     Right Ear: Tympanic membrane, ear canal and external ear normal.     Left Ear: Tympanic membrane, ear canal and external ear normal.  Eyes:     Extraocular Movements: Extraocular movements intact.     Conjunctiva/sclera: Conjunctivae normal.     Pupils: Pupils are equal, round, and reactive to light.  Cardiovascular:     Rate and Rhythm: Normal rate and regular rhythm.  Pulmonary:     Effort: Pulmonary effort is normal.     Breath sounds: Normal breath sounds.  Abdominal:     General: Bowel sounds are normal.     Palpations: Abdomen is soft. There is no mass.      Tenderness: There is no abdominal tenderness.  Genitourinary:    Comments: (Nurse Berenice assisted) Small pea-sized bump palpalbe 5 o'clock position just past the introitus.  Exam with speculum does not reveal any visible lesion.   No external abnormalities noted.  No discharge.  Musculoskeletal:     Cervical back: Neck supple.     Right lower leg: No edema.     Left lower leg: No edema.  Lymphadenopathy:     Cervical: No cervical adenopathy.  Skin:    General: Skin is warm and dry.  Neurological:     Mental Status: She is alert and oriented to person, place, and time.     Motor: No weakness or tremor.     Coordination: Romberg sign negative. Coordination normal. Finger-Nose-Finger Test normal.     Gait: Gait is intact. Gait normal.     Deep Tendon Reflexes:     Reflex Scores:      Patellar reflexes are 2+ on the right side and 2+ on the left side. Psychiatric:        Attention and Perception: Attention normal.        Mood and Affect: Affect is not inappropriate.        Speech: Speech normal.        Behavior: Behavior normal. Behavior is cooperative.     Comments: Engaged.  Pleasant.              Assessment & Plan:    Encounter Diagnoses  Name Primary?   Visit for annual health examination Yes   Mood disorder ([place])    Carpal tunnel syndrome of left wrist    Vaginal symptom       Mood disorder -Increase sertraline -schedule with counselor/[facility] -pt will follow up 3-4 week to check on medication effect  Carpal tunnel -pt encouraged to Add icing bid for 10-20 minutes -Increase splint time -continue naproxen  Vaginal bump -Refer to gyn for look at bump -pt was given application for [facility] financial assistance   F/u 3-4 week to recheck sertraline.  She is to contact office sooner prn

## 2022-07-22 ENCOUNTER — Ambulatory Visit: Payer: Self-pay

## 2022-07-24 ENCOUNTER — Encounter: Payer: Self-pay | Admitting: Adult Health

## 2022-07-24 ENCOUNTER — Ambulatory Visit (INDEPENDENT_AMBULATORY_CARE_PROVIDER_SITE_OTHER): Payer: Self-pay | Admitting: Adult Health

## 2022-07-24 VITALS — BP 96/52 | HR 66 | Ht 60.0 in | Wt 166.5 lb

## 2022-07-24 DIAGNOSIS — Z789 Other specified health status: Secondary | ICD-10-CM

## 2022-07-24 NOTE — Progress Notes (Addendum)
  Subjective:     Patient ID: Samantha Khan, female   DOB: [birth year], 32 y.o.   MRN: [id number]  HPI Samantha Khan is a 32 year old Hispanic female, single, [id number], in for check on bump in vaginal for a bout a month or so, no pain, she had physical 07/16/22 with Larene Beach at [facility] and she noticed pea sized area just inside introitus, at 5  o'clock.   Last pap was negative HPV, NILM 03/06/20.  PCP is Soyla Dryer.  Review of Systems Bump in vagina, no pain   Reviewed past medical,surgical, social and family history. Reviewed medications and allergies.     Objective:   Physical Exam BP (!) 96/52 (BP Location: Right Arm, Patient Position: Sitting, Cuff Size: Normal)   Pulse 66   Ht 5' (1.524 m)   Wt 166 lb 8 oz (75.5 kg)   LMP 07/13/2022   Breastfeeding Yes   BMI 32.52 kg/m     Skin warm and dry.Pelvic: external genitalia is normal in appearance no lesions, vagina: pink and moist, no mass noted, does have  skin near introitus, that is more prominent,urethra has no lesions or masses noted, cervix:smooth and bulbous, uterus: normal size, shape and contour, non tender, no masses felt, adnexa: no masses or tenderness noted. Bladder is non tender and no masses felt.  Fall risk is low  [facility] - 07/24/22 1105       Pregnancy Intention Screening   Does the patient want to become pregnant in the next year? No    Does the patient's partner want to become pregnant in the next year? No    Would the patient like to discuss contraceptive options today? No      Contraception Wrap Up   Current Method Female Condom    End Method Female Condom            Examination chaperoned by Levy Pupa LPN  Assessment:     1. Normal vagina No lesions, does have prominent skin near introitus,felt to be normal  Showed variations in vagina in [facility]     Plan:     Follow up prn

## 2022-07-29 ENCOUNTER — Ambulatory Visit: Payer: Self-pay

## 2022-07-29 DIAGNOSIS — F32A Depression, unspecified: Secondary | ICD-10-CM

## 2022-07-29 NOTE — Progress Notes (Signed)
This is session # 1 with Samantha Khan for individual counseling for anxiety and depression. Patient presents alone. BHP spent 45 minutes with patient.    Patient is presenting with anxious symptoms including autonomic hyperactivity, difficulty breathing, racing thoughts, and feelings of overwhelm, and depressive symptoms including increased crying, feelings of sadness, hopelessness, and negative thought patterns. Symptoms are related to grief, interpersonal stressors, and limited supports. Patient will likely benefit from individual counseling services with strengths-based intervention and coping skill development.   This procedure has been fully reviewed with the patient and informed consent has been obtained.   Patient location: Pt seen in clinic via telehealth.   I connected with  Samantha Khan on 07/29/22 by a video enabled telemedicine application and verified that I am speaking with the correct person.   I discussed the limitations of evaluation and management by telemedicine. The patient expressed understanding and agreed to proceed.     ASSESSMENT AND PLAN   Current diagnosis:  anxiety and depression   We created the following treatment plan at today's appointment:               1)  07/29/22               2)  Decrease anxious and depressive symptoms by increasing awareness, coping skills, self-care, supports, and overall level of lie satisfaction and well-being.                3)  Medication management with Provider.               4)  Next screening due in 4 session.     HISTORY OF PRESENT ILLNESS   Mental Status: Samantha Khan presented oriented X4. Mood and affect were anxious and depressed. Judgment was appropriate. Memory was appropriate. Thought processes and content were appropriate. Patient denies SI/HI.     Narrative: Met with pt for scheduled appointment. Person-centered approach used to begin therapeutic alliance. Explored presenting concerns, sx, stressors, hx, supports, and goals.  Normalized concerns and provided psychoeducation around self-care and coping skill development. Practiced deep breathing at the end of the visit. Assessed for any other concerns pt wanted to address today, which they denied. Scheduled follow up.   Effectiveness of the intervention(s) and the beneficiary's response or progress toward goal(s):  Patient is in a contemplative stage of change to engage in treatment plan.

## 2022-08-12 ENCOUNTER — Ambulatory Visit: Payer: Self-pay | Admitting: Physician Assistant

## 2022-08-12 ENCOUNTER — Encounter: Payer: Self-pay | Admitting: Physician Assistant

## 2022-08-12 ENCOUNTER — Ambulatory Visit: Payer: Self-pay

## 2022-08-12 ENCOUNTER — Ambulatory Visit (HOSPITAL_COMMUNITY)
Admission: RE | Admit: 2022-08-12 | Discharge: 2022-08-12 | Disposition: A | Discharge status: Home / Self Care | Payer: Self-pay | Source: Ambulatory Visit | Attending: Physician Assistant | Admitting: Physician Assistant

## 2022-08-12 VITALS — BP 110/68 | HR 89 | Temp 97.3°F | Wt 165.1 lb

## 2022-08-12 DIAGNOSIS — G5602 Carpal tunnel syndrome, left upper limb: Secondary | ICD-10-CM

## 2022-08-12 DIAGNOSIS — M25571 Pain in right ankle and joints of right foot: Secondary | ICD-10-CM

## 2022-08-12 DIAGNOSIS — F419 Anxiety disorder, unspecified: Secondary | ICD-10-CM

## 2022-08-12 MED ORDER — SERTRALINE HCL 100 MG PO TABS: 100.0000 mg | ORAL_TABLET | Freq: Every day | ORAL | 3 refills | Status: DC

## 2022-08-12 MED ORDER — NAPROXEN 500 MG PO TABS: 500.0000 mg | ORAL_TABLET | Freq: Two times a day (BID) | ORAL | 0 refills | Status: DC

## 2022-08-12 NOTE — Progress Notes (Signed)
This is session #2 with Samantha Khan for individual counseling for anxiety and depression. Patient presents alone with infant child. BHP spent 35 minutes with patient.    Patient is presenting with anxious symptoms including autonomic hyperactivity, difficulty breathing, racing thoughts, and feelings of overwhelm, and depressive symptoms including increased crying, feelings of sadness, hopelessness, and negative thought patterns. Symptoms are related to grief, interpersonal stressors, and limited supports. Patient will likely benefit from individual counseling services with strengths-based intervention and coping skill development.   This procedure has been fully reviewed with the patient and informed consent has been obtained.   Patient location: Pt seen in clinic via telehealth.   I connected with  Samantha Khan on 08/12/22 by a video enabled telemedicine application and verified that I am speaking with the correct person.   I discussed the limitations of evaluation and management by telemedicine. The patient expressed understanding and agreed to proceed.     ASSESSMENT AND PLAN   Current diagnosis:  anxiety and depression   We created the following treatment plan at today's appointment:               1)  08/12/22               2)  Decrease anxious and depressive symptoms by increasing awareness, coping skills, self-care, supports, and overall level of life satisfaction and well-being.                3)  Medication management with Provider. On 08/12/22, pt shared that dose of sertraline was increased from 5mg  to 10mg , and felt that had been an improvement to her mood overall.               4)  Next screening due in 3 sessions.     HISTORY OF PRESENT ILLNESS   Mental Status: Samantha Khan presented oriented X4. Mood and affect were anxious and depressed. Judgment was appropriate. Memory was appropriate. Thought processes and content were appropriate. Patient denies SI/HI.     Narrative: Met with pt for  scheduled appointment. Person-centered approach used to continue therapeutic alliance. Pt provided updates since previous visit including increased gratitude. Explored interpersonal conflict and desire for close relationships. Normalized concerns and anxiousness associated with confrontation. Role-played to gain insight and named that other people's thoughts/feelings are outside of one's control. Provided psychoeducation around coping skills of deep breathing and mantras. Assessed for any other concerns pt wanted to address today, which they denied. Scheduled follow up in 2 weeks.   Effectiveness of the intervention(s) and the beneficiary's response or progress toward goal(s):  Patient is in a contemplative stage of change to engage in treatment plan.

## 2022-08-12 NOTE — Progress Notes (Signed)
BP 110/68   Pulse 89   Temp (!) 97.3 F (36.3 C)   Wt 165 lb 1.6 oz (74.9 kg)   LMP 07/13/2022   SpO2 98%   BMI 32.24 kg/m    Subjective:    Patient ID: Samantha Khan, female    DOB: 1991/07/07, 32 y.o.   MRN: [id number]  HPI: Samantha Khan is a 32 y.o. female presenting on 08/12/2022 for Carpal Tunnel (Pt states no improvements.), Mental Health Problem, and Ankle Pain (Pt states she tripped and fell on 1-25. Pt states she twisted her R ankle and is still swollen and painful. Pt had been taking naproxen thinking it would help but did not. Pt states it hurts when she walks.and feels pain radiates up her calf.)   HPI  Chief Complaint  Patient presents with   Carpal Tunnel    Pt states no improvements.   Mental Health Problem   Ankle Pain    Pt states she tripped and fell on 1-25. Pt states she twisted her R ankle and is still swollen and painful. Pt had been taking naproxen thinking it would help but did not. Pt states it hurts when she walks.and feels pain radiates up her calf.     Pt says the increase sertraline helped her mood.  She is sleeping.  She has appointment with [facility] today.  She denies SI, HI.    When asked about her carpal tunnel, she says she ran out of the naproxen.  She wears splint all night and a little bit during the day.   She is icing it 2 hours/day.  She twisted her ankle while getting into her car as she was hurrying to get to dental appt.   She is using ace wrap and icy hot on the ankle.   It was bruised but bruise improved.  Pain and swelling persist.     Relevant past medical, surgical, family and social history reviewed and updated as indicated. Interim medical history since our last visit reviewed. Allergies and medications reviewed and updated.   Current Outpatient Medications:    Prenatal Vit-Fe Fumarate-FA (PRENATAL VITAMIN PO), Take by mouth., Disp: , Rfl:    sertraline (ZOLOFT) 100 MG tablet, Take 1 tablet (100 mg total) by mouth daily.,  Disp: 30 tablet, Rfl: 3   naproxen (NAPROSYN) 500 MG tablet, Take 1 tablet (500 mg total) by mouth 2 (two) times daily with a meal. (Patient not taking: Reported on 08/12/2022), Disp: 30 tablet, Rfl: 0   Review of Systems  Per HPI unless specifically indicated above     Objective:    BP 110/68   Pulse 89   Temp (!) 97.3 F (36.3 C)   Wt 165 lb 1.6 oz (74.9 kg)   LMP 07/13/2022   SpO2 98%   BMI 32.24 kg/m   Wt Readings from Last 3 Encounters:  08/12/22 165 lb 1.6 oz (74.9 kg)  07/24/22 166 lb 8 oz (75.5 kg)  07/16/22 166 lb 3.2 oz (75.4 kg)    Physical Exam Constitutional:      General: She is not in acute distress.    Appearance: She is not toxic-appearing.  HENT:     Head: Normocephalic and atraumatic.  Pulmonary:     Effort: Pulmonary effort is normal. No respiratory distress.  Musculoskeletal:     Left wrist: Tenderness present. No swelling. Normal range of motion. Normal pulse.     Right ankle: Swelling and ecchymosis present. Tenderness present over the lateral malleolus  and medial malleolus.     Right foot: Normal. Normal range of motion. No swelling, tenderness or bony tenderness. Normal pulse.     Comments: Mild right medial malleolus tenderness.  More lateral malleolus tenderness.   Left wrist + tinel  Skin:    General: Skin is warm and dry.  Neurological:     Mental Status: She is alert and oriented to person, place, and time.  Psychiatric:        Attention and Perception: Attention normal.        Mood and Affect: Mood is anxious.        Speech: Speech normal.        Behavior: Behavior normal. Behavior is cooperative.           Assessment & Plan:    Encounter Diagnoses  Name Primary?   Anxiety and depression Yes   Carpal tunnel syndrome of left wrist    Acute right ankle pain      Ankle injury -Xray ankle -continue ace wrap, elevate the ankle, naproxen, ice 10-20 minutes twice daily. -pt was given application for [employer] financial  assistance/cafa   Carpal tunnel left writst -Refer to ortho for carpal that doesn't appear to be improving -continue splinting, ice 10-20 minutes twice daily, get back on naproxen -pt was given application for [employer] financial assistance/cafa   Anxiety & depression -Continue sertraline -[facility] as scheduled.   F/u 1 month.  RTO sooner prn

## 2022-08-26 ENCOUNTER — Ambulatory Visit: Payer: Self-pay

## 2022-08-26 DIAGNOSIS — F32A Depression, unspecified: Secondary | ICD-10-CM

## 2022-08-26 NOTE — Progress Notes (Signed)
This is session #3 with [name] for individual counseling for anxiety and depression. Patient presents alone. BHP spent 40 minutes with patient.    Patient is presenting with anxious symptoms including autonomic hyperactivity, difficulty breathing, racing thoughts, and feelings of overwhelm, and depressive symptoms including increased crying, feelings of sadness, hopelessness, and negative thought patterns. Symptoms are related to grief, interpersonal stressors, and limited supports. Patient will likely benefit from individual counseling services with strengths-based intervention and coping skill development.   This procedure has been fully reviewed with the patient and informed consent has been obtained.   Patient location: Pt seen in clinic via telehealth.   I connected with  Samantha Khan on 08/26/22 by a video enabled telemedicine application and verified that I am speaking with the correct person.   I discussed the limitations of evaluation and management by telemedicine. The patient expressed understanding and agreed to proceed.     ASSESSMENT AND PLAN   Current diagnosis:  anxiety and depression   We created the following treatment plan at today's appointment:               1)  08/26/22               2)  Decrease anxious and depressive symptoms by increasing awareness, coping skills, self-care, supports, and overall level of life satisfaction and well-being.                3)  Medication management with Provider. On 08/12/22, pt shared that dose of sertraline was increased from 75m to 176m and felt that had been an improvement to her mood overall.               4)  Next screening due in 2 sessions.     HISTORY OF PRESENT ILLNESS   Mental Status: Samantha Khan oriented X4. Mood and affect were anxious and depressed. Judgment was appropriate. Memory was appropriate. Thought processes and content were appropriate. Patient denies SI/HI.     Narrative: Met with pt for scheduled appointment.  Person-centered approach used to continue therapeutic alliance. Pt provided updates since previous visit including Valentine's day and attempts to encourage support system relationships. Explored interpersonal conflict and hurt, with emphasis on enmeshment and difficulty saying "no". Named locus of control and boundaries related to others' feelings. Added goal to engage in conversation with supports through open-ended questions. Asked miracle question around making amends for past hurt. Assessed for any other concerns pt wanted to address today, which they denied. Scheduled follow up in 2 weeks.   Effectiveness of the intervention(s) and the beneficiary's response or progress toward goal(s):  Patient is in a contemplative stage of change to engage in treatment plan.

## 2022-09-09 ENCOUNTER — Encounter: Payer: Self-pay | Admitting: Physician Assistant

## 2022-09-09 ENCOUNTER — Ambulatory Visit: Payer: Self-pay

## 2022-09-09 ENCOUNTER — Ambulatory Visit: Payer: Self-pay | Admitting: Physician Assistant

## 2022-09-09 VITALS — BP 100/64 | HR 68 | Temp 97.7°F | Ht 60.0 in | Wt 161.5 lb

## 2022-09-09 DIAGNOSIS — F32A Depression, unspecified: Secondary | ICD-10-CM

## 2022-09-09 MED ORDER — SERTRALINE HCL 100 MG PO TABS
100.0000 mg | ORAL_TABLET | Freq: Every day | ORAL | 3 refills | Status: DC
Start: 2022-09-09 — End: 2022-12-09

## 2022-09-09 NOTE — Patient Instructions (Addendum)
Para opciones de anticonceptivos [facility].  [phone]

## 2022-09-09 NOTE — Progress Notes (Signed)
This is session #4 with Samantha Khan for individual counseling for anxiety and depression. Patient presents alone. BHP spent 40 minutes with patient.    Patient is presenting with anxious symptoms including autonomic hyperactivity, difficulty breathing, racing thoughts, and feelings of overwhelm, and depressive symptoms including increased crying, feelings of sadness, hopelessness, and negative thought patterns. Symptoms are related to environmental barriers, grief, and interpersonal stressors. Patient will likely benefit from individual counseling services with strengths-based intervention and coping skill development.   This procedure has been fully reviewed with the patient and informed consent has been obtained.   Patient location: Pt seen in clinic via telehealth.   I connected with  Samantha Khan on 09/09/22 by a video enabled telemedicine application and verified that I am speaking with the correct person.   I discussed the limitations of evaluation and management by telemedicine. The patient expressed understanding and agreed to proceed.     ASSESSMENT AND PLAN   Current diagnosis:  anxiety and depression   We created the following treatment plan at today's appointment:               1)  09/09/22               2)  Decrease anxious and depressive symptoms by increasing awareness, coping skills, self-care, supports, and overall level of life satisfaction and well-being.                3)  Medication management with Provider. On 08/12/22, pt shared that dose of sertraline was increased from '5mg'$  to '10mg'$ , and felt that had been an improvement to her mood overall.               4)  Next screening due in 1 session.     HISTORY OF PRESENT ILLNESS   Mental Status: Samantha Khan presented oriented X4. Mood and affect were anxious and depressed. Judgment was appropriate. Memory was appropriate. Thought processes and content were appropriate. Patient denies SI/HI.     Narrative: Met with pt for scheduled  appointment. Person-centered approach used to continue therapeutic alliance. Pt provided updates since previous visit including attempts to encourage support system relationships. Explored interpersonal hurt related to self-worth and confidence. Provided normalization and support. Added goal to engage in conversation using "I statements" to avoid blaming and improve communication. Added homework to consider positive attributes about support system that she enjoys and wants in her life. Concluded visit with psychoeducation on affirmations and practiced mindfully speaking positive affirmation over herself and her intentions to grow in confidence. Assessed for any other concerns pt wanted to address today, which they denied. Scheduled follow up in 2 weeks.   Effectiveness of the intervention(s) and the beneficiary's response or progress toward goal(s):  Patient is in a contemplative stage of change to engage in treatment plan.

## 2022-09-09 NOTE — Progress Notes (Signed)
   BP 100/64   Pulse 68   Temp 97.7 F (36.5 C)   Ht 5' (1.524 m)   Wt 161 lb 8 oz (73.3 kg)   SpO2 97%   BMI 31.54 kg/m    Subjective:    Patient ID: Samantha Khan, female    DOB: 12/27/1990, 32 y.o.   MRN: [id number]  HPI: Samantha Khan is a 32 y.o. female presenting on 09/09/2022 for Follow-up   HPI   Pt is in today for follow up mood.  She was in last month and her sertraline was increased.  She says she feels like the increase has been very helpful.  She has also been seeing [facility] regularly which she says is very helpful.  She denies SI, HI.    Relevant past medical, surgical, family and social history reviewed and updated as indicated. Interim medical history since our last visit reviewed. Allergies and medications reviewed and updated.   Current Outpatient Medications:    naproxen (NAPROSYN) 500 MG tablet, Take 1 tablet (500 mg total) by mouth 2 (two) times daily with a meal., Disp: 30 tablet, Rfl: 0   Prenatal Vit-Fe Fumarate-FA (PRENATAL VITAMIN PO), Take by mouth., Disp: , Rfl:    sertraline (ZOLOFT) 100 MG tablet, Take 1 tablet (100 mg total) by mouth daily., Disp: 30 tablet, Rfl: 3   Review of Systems  Per HPI unless specifically indicated above     Objective:    BP 100/64   Pulse 68   Temp 97.7 F (36.5 C)   Ht 5' (1.524 m)   Wt 161 lb 8 oz (73.3 kg)   SpO2 97%   BMI 31.54 kg/m   Wt Readings from Last 3 Encounters:  09/09/22 161 lb 8 oz (73.3 kg)  08/12/22 165 lb 1.6 oz (74.9 kg)  07/24/22 166 lb 8 oz (75.5 kg)    Physical Exam Constitutional:      General: She is not in acute distress.    Appearance: She is not ill-appearing or toxic-appearing.  HENT:     Head: Normocephalic and atraumatic.  Cardiovascular:     Rate and Rhythm: Normal rate and regular rhythm.  Pulmonary:     Effort: Pulmonary effort is normal. No respiratory distress.     Breath sounds: No wheezing or rhonchi.  Skin:    General: Skin is warm and dry.  Neurological:      Mental Status: She is alert and oriented to person, place, and time.  Psychiatric:        Attention and Perception: Attention normal.        Speech: Speech normal.        Behavior: Behavior normal. Behavior is cooperative.           Assessment & Plan:   Encounter Diagnosis  Name Primary?   Anxiety and depression Yes      -pt to continue sertraline -pt to continue with [facility] counselor -pt is given contact information for [facility] for depo-provera which is not offered by [facility] -pt to follow up 71month.  She is to contact office sooner prn

## 2022-09-23 ENCOUNTER — Ambulatory Visit: Payer: Self-pay

## 2022-09-23 DIAGNOSIS — F419 Anxiety disorder, unspecified: Secondary | ICD-10-CM

## 2022-09-23 NOTE — Progress Notes (Signed)
This is session #5 with Neile for individual counseling for anxiety and depression. Patient presents alone. BHP spent 40 minutes with patient.    Patient is presenting with anxious symptoms including autonomic hyperactivity, difficulty breathing, racing thoughts, and feelings of overwhelm, and depressive symptoms including increased crying, feelings of sadness, hopelessness, and negative thought patterns. Symptoms are related to environmental barriers, grief, and interpersonal stressors. Patient will likely benefit from individual counseling services with strengths-based intervention and coping skill development.   This procedure has been fully reviewed with the patient and informed consent has been obtained.   Patient location: Pt seen in clinic via telehealth.   I connected with  Danniel on 09/23/22 by a video enabled telemedicine application and verified that I am speaking with the correct person.   I discussed the limitations of evaluation and management by telemedicine. The patient expressed understanding and agreed to proceed.     ASSESSMENT AND PLAN   Current diagnosis:  anxiety and depression   We created the following treatment plan at today's appointment:               1)  09/23/22               2)  Decrease anxious and depressive symptoms by increasing awareness, coping skills, self-care, supports, and overall level of life satisfaction and well-being.                3)  Medication management with Provider. On 08/12/22, pt shared that dose of sertraline was increased from 5mg  to 10mg , and felt that had been an improvement to her mood overall.               4)  Next screening due in 1 session.     HISTORY OF PRESENT ILLNESS   Mental Status: Shayna presented oriented X4. Mood and affect were anxious and depressed. Judgment was appropriate. Memory was appropriate. Thought processes and content were appropriate. Patient denies SI/HI.     Narrative: Met with pt for scheduled  appointment. Person-centered approach used to continue therapeutic alliance. Pt provided updates since previous visit including realizations around locus of control. Provided normalization and support. Psychoeducation shared through the "Circles of Control" to identify what is and is not within individual responsibility. Added goal to use awareness and coping skills such as deep breathing to let go of what is outside of responsibility, work on her own responses and reactions, and to focus on investing in children. Pt encouraged to consider planning for Easter and researching extracurricular options for son as homework. Assessed for any other concerns pt wanted to address today, which they denied. Scheduled follow up in 2 weeks.   Effectiveness of the intervention(s) and the beneficiary's response or progress toward goal(s):  Patient is in a planning stage of change to engage in treatment plan.

## 2022-10-07 ENCOUNTER — Ambulatory Visit: Payer: Self-pay

## 2022-10-14 ENCOUNTER — Ambulatory Visit: Payer: Self-pay

## 2022-10-14 DIAGNOSIS — F419 Anxiety disorder, unspecified: Secondary | ICD-10-CM

## 2022-10-14 NOTE — Progress Notes (Signed)
This is session #6 with Shamel for individual counseling for anxiety and depression. Patient presents alone. BHP spent 20 minutes with patient.    Patient reports improvement overall in anxious and depressive symptoms, stating she feels "calm". Pt previously presented with anxious symptoms including autonomic hyperactivity, difficulty breathing, racing thoughts, and feelings of overwhelm, and depressive symptoms including increased crying, feelings of sadness, hopelessness, and negative thought patterns. Symptoms were related to environmental barriers, grief, and interpersonal stressors that have decreased at current visit. Patient will likely benefit from individual counseling services with strengths-based intervention and coping skill development with plans to prepare for termination in about 2 visits..   This procedure has been fully reviewed with the patient and informed consent has been obtained.   Patient location: Pt seen in clinic via telehealth.   I connected with  Labrenda on 10/14/22 by a video enabled telemedicine application and verified that I am speaking with the correct person.   I discussed the limitations of evaluation and management by telemedicine. The patient expressed understanding and agreed to proceed.     ASSESSMENT AND PLAN   Current diagnosis:  anxiety and depression   We created the following treatment plan at today's appointment:               1)  10/14/22               2)  Maintain decrease in anxious and depressive symptoms through self awareness, coping skills, self-care, and supports, and increase overall level of life satisfaction and well-being.                3)  Medication management with Provider. On 08/12/22, pt shared that dose of sertraline was increased from 5mg  to 10mg , and felt that had been an improvement to her mood overall.               4)  Next screening due in 1 session.     HISTORY OF PRESENT ILLNESS   Mental Status: Paulene presented oriented X4.  Mood and affect were euthymic and appropriate. Judgment was appropriate. Memory was appropriate. Thought processes and content were appropriate. Patient denies SI/HI.     Narrative: Met with pt for scheduled appointment. Person-centered approach used to continue therapeutic alliance. Pt provided updates since previous visit including positive Easter celebration and child's baptism. Reviewed locus of control and noted pt growth. Provided affirmations and support. Assessed for any other concerns pt wanted to address today, which they denied. Pt agrees to begin spacing out visits as she prepares to not need continued services. Scheduled follow up in 3 weeks.   Effectiveness of the intervention(s) and the beneficiary's response or progress toward goal(s):  Patient is in a maintenance stage of change to engage in treatment plan.

## 2022-11-04 ENCOUNTER — Ambulatory Visit: Payer: Self-pay

## 2022-11-04 DIAGNOSIS — F419 Anxiety disorder, unspecified: Secondary | ICD-10-CM

## 2022-11-04 NOTE — Progress Notes (Signed)
This is session #7 with Samantha Khan for individual behavioral health services for anxiety. Patient presents with 2 young children. BHP spent 30 minutes with patient.    Patient reports improvement overall in previous depressive symptoms, but notes continued daily anxious symptoms. Anxious symptoms include autonomic hyperactivity, difficulty breathing, racing thoughts, irritability, and feelings of overwhelm. Symptoms are related to environmental barriers and interpersonal stressors. Patient will likely benefit from individual counseling services with strengths-based intervention and coping skill development with plans to prepare for termination in about 2 visits if symptoms continue to improve..   This procedure has been fully reviewed with the patient and informed consent has been obtained.   Patient location: Pt seen in clinic via telehealth.   I connected with  Samantha Khan on 11/04/22 by a video enabled telemedicine application and verified that I am speaking with the correct person.   I discussed the limitations of evaluation and management by telemedicine. The patient expressed understanding and agreed to proceed.     ASSESSMENT AND PLAN   Current diagnosis:  anxiety  We created the following treatment plan at today's appointment:               1)  11/04/22               2)  Maintain decrease depressive symptoms, decrease anxious symptoms through self awareness, coping skills, self-care, and supports, and increase overall level of life satisfaction and well-being.                3)  Medication management with Provider. On 08/12/22, pt shared that dose of sertraline was increased from 5mg  to 10mg , and felt that had been an improvement to her mood overall.               4)  Next screening due in 1 session.     HISTORY OF PRESENT ILLNESS   Mental Status: Samantha Khan presented oriented X4. Mood and affect were anxious and appropriate. Judgment was appropriate. Memory was appropriate. Thought processes and  content were appropriate. Patient denies SI/HI.     Narrative: Met with pt for scheduled appointment. Person-centered approach used to continue therapeutic alliance. Pt provided updates since previous visit including interpersonal tension and difficulty balancing household chores and children with numerous stressors. Pt demonstrated self-awareness referring to locus of control. Explored strressors and created a plan to list "to do's" daily and prioritize by focusing on only one task at a time to minimize overwhelm (prompt support) and increase mindful presence. Reset goal to include increasing enjoyment of present moment. Assessed for any other concerns pt wanted to address today, which they denied. Scheduled follow up in 3 weeks.   Effectiveness of the intervention(s) and the beneficiary's response or progress toward goal(s):  Patient is in a preparation stage of change to engage in treatment plan.

## 2022-11-25 ENCOUNTER — Ambulatory Visit: Payer: Self-pay

## 2022-11-25 DIAGNOSIS — F419 Anxiety disorder, unspecified: Secondary | ICD-10-CM

## 2022-11-25 NOTE — Progress Notes (Signed)
This is session #8 with Arial for individual behavioral health services for anxiety. Patient presents with 1 young child. BHP spent 30 minutes with patient.    Patient reports improvement overall in previous depressive symptoms, but notes continued anxious symptoms. Anxious symptoms include autonomic hyperactivity, difficulty breathing, racing thoughts, irritability, and feelings of overwhelm. Symptoms are related to environmental barriers and interpersonal stressors. Patient will likely benefit from individual services with strengths-based intervention and coping skill development with plans to prepare for termination if symptoms continue to improve..   This procedure has been fully reviewed with the patient and informed consent has been obtained.   Patient location: Pt seen in clinic via telehealth.   I connected with  Afrika on 11/25/22 by a video enabled telemedicine application and verified that I am speaking with the correct person.   I discussed the limitations of evaluation and management by telemedicine. The patient expressed understanding and agreed to proceed.     ASSESSMENT AND PLAN   Current diagnosis:  anxiety   We created the following treatment plan at today's appointment:               1)  11/25/22               2)  Maintain decrease depressive symptoms, decrease anxious symptoms through self awareness, coping skills, self-care, and supports, and increase overall level of life satisfaction and well-being.                3)  Medication management with Provider. On 08/12/22, pt shared that dose of sertraline was increased from 5mg  to 10mg , and felt that had been an improvement to her mood overall.               4)  Next screening due in 1 session.     HISTORY OF PRESENT ILLNESS   Mental Status: Herlinda presented oriented X4. Mood and affect were anxious and appropriate. Judgment was appropriate. Memory was appropriate. Thought processes and content were appropriate. Patient  denies SI/HI.     Narrative: Met with pt for scheduled appointment. Person-centered approach used to continue therapeutic alliance. Pt provided updates since previous visit including interpersonal tension. Pt demonstrated self-awareness and maintained appropriate use of the locus of control. Explored strressors and created a plan to communicate needs as well as ask support what needs they have to minimize overwhelm and better care for the whole system. Assessed for any other concerns pt wanted to address today, which they denied. Scheduled follow up in 2 weeks.   Effectiveness of the intervention(s) and the beneficiary's response or progress toward goal(s):  Patient is in a preparation stage of change to engage in treatment plan.

## 2022-12-09 ENCOUNTER — Encounter: Payer: Self-pay | Admitting: Physician Assistant

## 2022-12-09 ENCOUNTER — Ambulatory Visit: Payer: Self-pay

## 2022-12-09 ENCOUNTER — Ambulatory Visit: Payer: Self-pay | Admitting: Physician Assistant

## 2022-12-09 VITALS — BP 96/66 | HR 91 | Temp 97.6°F | Ht 60.0 in | Wt 168.0 lb

## 2022-12-09 DIAGNOSIS — F419 Anxiety disorder, unspecified: Secondary | ICD-10-CM

## 2022-12-09 DIAGNOSIS — Z30013 Encounter for initial prescription of injectable contraceptive: Secondary | ICD-10-CM

## 2022-12-09 LAB — POCT URINE PREGNANCY: Preg Test, Ur: NEGATIVE

## 2022-12-09 MED ORDER — SERTRALINE HCL 50 MG PO TABS: 50.0000 mg | ORAL_TABLET | Freq: Every morning | ORAL | 3 refills | Status: DC

## 2022-12-09 MED ORDER — MEDROXYPROGESTERONE ACETATE 150 MG/ML IM SUSY
150.0000 mg | PREFILLED_SYRINGE | Freq: Once | INTRAMUSCULAR | Status: AC
  Administered 2022-12-09: 150 mg via INTRAMUSCULAR

## 2022-12-09 MED ORDER — SERTRALINE HCL 100 MG PO TABS: 100.0000 mg | ORAL_TABLET | Freq: Every day | ORAL | 3 refills | Status: DC

## 2022-12-09 NOTE — Patient Instructions (Signed)
Medroxyprogesterone Injection (Contraception) Qu es este medicamento? La MEDROXIPROGESTERONA evita la ovulacin y Firefighter. Pertenece a un grupo de medicamentos llamados anticonceptivos. Este medicamento es una hormona del grupo de los progestgenos. Este medicamento puede ser utilizado para otros usos; si tiene alguna pregunta consulte con su proveedor de atencin mdica o con su farmacutico. MARCAS COMUNES: Depo-Provera, Depo-subQ Provera 104 Qu le debo informar a mi profesional de la salud antes de tomar este medicamento? Necesitan saber si usted presenta alguno de los siguientes problemas o situaciones: Asma Cogulos sanguneos Cncer de mama o antecedentes familiares de cncer de mama Depresin Diabetes Trastorno de la alimentacin (anorexia nerviosa) Consume bebidas alcohlicas con frecuencia Ataque cardiaco Presin arterial alta Infeccin por VIH o SIDA Enfermedad renal Enfermedad heptica Migraas Osteoporosis, huesos dbiles Convulsiones Accidente cerebrovascular Uso de tabaco Sangrado vaginal Una reaccin alrgica o inusual a la medroxiprogesterona, a otros medicamentos, alimentos, colorantes o conservantes Si est embarazada o buscando quedar embarazada Si est amamantando a un beb Cmo debo utilizar este medicamento? Depo-Provera CI inyeccin anticonceptiva se administra en un msculo. Depo-subQ Provera 104 inyectable se administra por va subcutnea. Se administra en un hospital o en un entorno clnico. Esta inyeccin generalmente se aplica durante los primeros 5 das del inicio de un periodo menstrual o 6 semanas despus de haber tenido un beb. Recibir un folleto de informacin para el paciente con cada receta y en cada ocasin que la vuelva a surtir. Asegrese de leer esta informacin cada vez cuidadosamente. El folleto puede cambiar frecuentemente. Hable con su equipo de atencin sobre el uso de este medicamento en nios. Puede requerir atencin especial.  Estas inyecciones han sido usadas en nias que han empezado a tener perodos [place]. Sobredosis: Pngase en contacto inmediatamente con un centro toxicolgico o una sala de urgencia si usted cree que haya tomado demasiado medicamento. ATENCIN: [employer] es solo para usted. No comparta este medicamento con nadie. Qu sucede si me olvido de una dosis? Cumpla con las citas para dosis de seguimiento. Debe recibir Neomia Dear inyeccin una vez cada 3 meses. Es importante no olvidar ninguna dosis. Llame a su equipo de atencin si no puede asistir a una cita. Qu puede interactuar con este medicamento? Antibiticos o medicamentos para inyecciones, especialmente rifampicina y [address] antivirales para VIH o hepatitis Aprepitant Armodafinilo Bexaroteno Bosentano Medicamentos para convulsiones, tales como [place], [place], [place], [place], [place], primidona, topiramato Mit[place]ano Modafinilo Hierba de [address] ser que esta lista no menciona todas las posibles interacciones. Informe a su profesional de [employer] de [employer] productos a base de hierbas, medicamentos de [place] o suplementos nutritivos que est tomando. Si usted fuma, consume bebidas alcohlicas o si utiliza drogas ilegales, indqueselo tambin a su profesional de [employer]. Algunas sustancias pueden interactuar con su medicamento. A qu debo estar atento al usar [employer]? Este medicamento no la protege de la infeccin por VIH (SIDA) ni de ninguna otra enfermedad de transmisin sexual. Usar este producto puede causarle prdida de calcio de los huesos. La prdida de calcio puede causar debilidad sea (osteoporosis). Solamente use este producto por ms de 2 aos si no hay otro mtodo anticonceptivo que sea adecuado para usted. Cuanto ms tiempo use este producto como anticonceptivo, ms posibilidades tendr de [employer] de debilitar sus huesos. Consulte a su equipo de atencin  sobre cmo Pharmacologist fuertes sus [employer]. Es posible que tenga un cambio en el patrn de sangrado o perodos menstruales irregulares. Muchas mujeres dejan de tener perodos [employer]  toman [employer]. Si recibi sus inyecciones a tiempo, sus probabilidades de estar embarazada son Rolm Bapt[place]. Si cree que podra estar embarazada, consulte a su equipo de atencin tan pronto como sea posible. Informe a su equipo de atencin si desea quedar embarazada en el prximo ao. [place] de este medicamento puede durar mucho tiempo despus de recibir la ltima inyeccin. Qu efectos secundarios puedo tener al [employer] este medicamento? Efectos secundarios que debe informar a su equipo de atencin tan pronto como sea posible: Reacciones alrgicas: erupcin cutnea, comezn/picazn, urticaria, hinchazn de la cara, los labios, la lengua o la garganta Cogulo sanguneo: Engineer, mining, hinchazn, calor en una pierna, falta de aire, dolor en el pecho Problemas en la vescula biliar: dolor de estmago intenso, nuseas, vmitos, fiebre Aumento de la presin arterial Lesin en el hgado: dolor en la regin abdominal superior derecha, prdida de apetito, nuseas, heces de color claro, orina amarilla oscura o marrn, color amarillento de los ojos o la piel, debilidad o fatiga inusuales Migraas o dolores de cabeza nuevos o peores Convulsiones Accidente cerebrovascular: entumecimiento o debilidad repentinos de la cara, un brazo o una pierna, dificultad para hablar, confusin, dificultad para caminar, prdida de equilibrio o coordinacin, mareos, dolor de cabeza intenso, cambio en la visin Flujo vaginal inusual, comezn/picazn u olor Empeoramiento del estado de nimo, sentimientos de depresin Efectos secundarios que generalmente no requieren atencin mdica (debe informarlos a su equipo de atencin si persisten o si son molestos): Engineer, mining o sensibilidad de las Designer, fashion/clothing oscuras de la piel en la cara u otras  reas expuestas al sol Ciclos menstruales irregulares o sangrado ligero entre periodos menstruales Nuseas Aumento de peso Puede ser que esta lista no menciona todos los posibles efectos secundarios. Comunquese a su mdico por asesoramiento mdico [employer]. Usted puede informar los efectos secundarios a la FDA por telfono al 1-800-FDA-1088. Dnde debo guardar mi medicina? Solamente un equipo de atencin puede Building control surveyor inyeccin. No se guarda en su casa. ATENCIN: Este folleto es un resumen. Puede ser que no cubra toda la posible informacin. Si usted tiene preguntas acerca de esta medicina, consulte con su mdico, su farmacutico o su profesional de Radiographer, therapeutic.  2024 Elsevier/Gold Standard (2022-05-14 00:00:00)

## 2022-12-09 NOTE — Progress Notes (Signed)
This is session #9 with Samantha Khan for individual behavioral health services for anxiety. Patient presents with 1 young child. BHP spent 40 minutes with patient.    Patient reports improvement overall in previous depressive symptoms, but notes continued anxious symptoms. Anxious symptoms include autonomic hyperactivity, difficulty breathing, racing thoughts, irritability, and feelings of overwhelm. Symptoms are related to environmental barriers and interpersonal stressors. Patient will likely benefit from individual services with strengths-based intervention and coping skill development with plans to prepare for termination if symptoms continue to improve..   This procedure has been fully reviewed with the patient and informed consent has been obtained.   Patient location: Pt seen in clinic via telehealth.   I connected with  Samantha Khan on 12/09/22 by a video enabled telemedicine application and verified that I am speaking with the correct person.   I discussed the limitations of evaluation and management by telemedicine. The patient expressed understanding and agreed to proceed.     ASSESSMENT AND PLAN   Current diagnosis:  anxiety   We created the following treatment plan at today's appointment:               1)  12/09/22               2)  Maintain decrease depressive symptoms, decrease anxious symptoms through self awareness, coping skills, self-care, and supports, and increase overall level of life satisfaction and well-being.                3)  Medication management with Provider. On 08/12/22, pt shared that dose of sertraline was increased from 5mg  to 10mg , and felt that had been an improvement to her mood overall. On 12/09/2022, pt requested an increase with Provider.               4)  Next screening due in 1 session.     HISTORY OF PRESENT ILLNESS   Mental Status: Samantha Khan presented oriented X4. Mood and affect were anxious and appropriate. Judgment was appropriate. Memory was appropriate. Thought  processes and content were appropriate. Patient denies SI/HI.     Narrative: Met with pt for scheduled appointment. Person-centered approach used to continue therapeutic alliance. Pt provided updates since previous visit including children's awards at school and continued interpersonal tension. Pt explored symptom of anger (which prompted desire for medication increase) tied to interpersonal stressor. Explored themes of comparison, resentment, attitude, and desire for help. Created a plan to communicate around two areas of desired support making the discussion positive and specific. Assessed for any other concerns pt wanted to address today, which they denied. Scheduled follow up in 3 weeks.   Effectiveness of the intervention(s) and the beneficiary's response or progress toward goal(s):  Patient is in a preparation stage of change to engage in treatment plan.

## 2022-12-09 NOTE — Progress Notes (Signed)
BP 96/66   Pulse 91   Temp 97.6 F (36.4 C)   Ht 5' (1.524 m)   Wt 168 lb (76.2 kg)   SpO2 96%   BMI 32.81 kg/m    Subjective:    Patient ID: Samantha Khan, female    DOB: 1990-11-23, 32 y.o.   MRN: [id number]  HPI: Samantha Khan is a 32 y.o. female presenting on 12/09/2022 for Mental Health Problem and Contraception   HPI   Chief Complaint  Patient presents with   Mental Health Problem   Contraception    Pt requests increase in sertraline.  She isn't sleeping much due to children and stress.  She denies SI, HI.   She works some  with her boyfriend's boss but doesn't have a regular job.  She has been seeing [facility] regularly and has appointment today.   She is currently using condoms for contraception.  She says she does not always use them.   She says she has never used other type but wants to.  She has 3 children and does not want any more at this time.    She is a non-smoker.  Her PAP is up to date.  LMP - 2 weeks ago.    Last intercourse was yesteday.  She Used a condom.  Last time she did not using condom was during her menses.     Relevant past medical, surgical, family and social history reviewed and updated as indicated. Interim medical history since our last visit reviewed. Allergies and medications reviewed and updated.   Current Outpatient Medications:    Prenatal Vit-Fe Fumarate-FA (PRENATAL VITAMIN PO), Take by mouth., Disp: , Rfl:    sertraline (ZOLOFT) 100 MG tablet, Take 1 tablet (100 mg total) by mouth daily., Disp: 30 tablet, Rfl: 3   naproxen (NAPROSYN) 500 MG tablet, Take 1 tablet (500 mg total) by mouth 2 (two) times daily with a meal. (Patient not taking: Reported on 12/09/2022), Disp: 30 tablet, Rfl: 0    Review of Systems  Per HPI unless specifically indicated above     Objective:    BP 96/66   Pulse 91   Temp 97.6 F (36.4 C)   Ht 5' (1.524 m)   Wt 168 lb (76.2 kg)   SpO2 96%   BMI 32.81 kg/m   Wt Readings from Last 3 Encounters:   12/09/22 168 lb (76.2 kg)  09/09/22 161 lb 8 oz (73.3 kg)  08/12/22 165 lb 1.6 oz (74.9 kg)    Physical Exam Constitutional:      General: She is not in acute distress.    Appearance: She is not ill-appearing or toxic-appearing.  HENT:     Head: Normocephalic and atraumatic.  Pulmonary:     Effort: Pulmonary effort is normal. No respiratory distress.  Neurological:     Mental Status: She is alert and oriented to person, place, and time.  Psychiatric:        Attention and Perception: Attention normal.        Speech: Speech normal.        Behavior: Behavior normal. Behavior is cooperative.     U preg - negative      Assessment & Plan:   Encounter Diagnoses  Name Primary?   Anxiety and depression Yes   Encounter for initial prescription of injectable contraceptive      Contraceptive management Spent long time discussing risks and benefits of different contraceptive options.  Pt prefers depo / shot.  Discussed risks/benefits of this in particular.  She was given reading information on it and allowed time to finish reading and ask questions.  Pt was given her first dose today and will RTO in 12 weeks for next dose.    Anxiety/depression Will increase her zoloft.  She will see [facility] today.  She will f/u in 2 week for recheck.  She is to contact office sooner prn

## 2022-12-23 ENCOUNTER — Ambulatory Visit: Payer: Self-pay | Admitting: Physician Assistant

## 2022-12-23 ENCOUNTER — Ambulatory Visit: Payer: Self-pay

## 2022-12-23 ENCOUNTER — Encounter: Payer: Self-pay | Admitting: Physician Assistant

## 2022-12-23 VITALS — BP 96/62 | HR 78 | Temp 97.9°F | Wt 166.0 lb

## 2022-12-23 DIAGNOSIS — F419 Anxiety disorder, unspecified: Secondary | ICD-10-CM

## 2022-12-23 NOTE — Progress Notes (Signed)
   BP 96/62   Pulse 78   Temp 97.9 F (36.6 C)   Wt 166 lb (75.3 kg)   SpO2 98%   BMI 32.42 kg/m    Subjective:    Patient ID: Samantha Khan, female    DOB: 09-Jan-1991, 32 y.o.   MRN: [id number]  HPI: Samantha Khan is a 32 y.o. female presenting on 12/23/2022 for Mental Health Problem   HPI  Pt says the increase in her zoloft has been very helpful.  She says she is sleeping okay from [facility] standpoint but still not sleeping well due to her three children.   She has no SI, HI.   Relevant past medical, surgical, family and social history reviewed and updated as indicated. Interim medical history since our last visit reviewed. Allergies and medications reviewed and updated.   Current Outpatient Medications:    medroxyPROGESTERone (DEPO-PROVERA) 150 MG/ML injection, Inject 150 mg into the muscle every 3 (three) months., Disp: , Rfl:    Prenatal Vit-Fe Fumarate-FA (PRENATAL VITAMIN PO), Take by mouth., Disp: , Rfl:    sertraline (ZOLOFT) 100 MG tablet, Take 1 tablet (100 mg total) by mouth at bedtime., Disp: 30 tablet, Rfl: 3   sertraline (ZOLOFT) 50 MG tablet, Take 1 tablet (50 mg total) by mouth every morning., Disp: 30 tablet, Rfl: 3    Review of Systems  Per HPI unless specifically indicated above     Objective:    BP 96/62   Pulse 78   Temp 97.9 F (36.6 C)   Wt 166 lb (75.3 kg)   SpO2 98%   BMI 32.42 kg/m   Wt Readings from Last 3 Encounters:  12/23/22 166 lb (75.3 kg)  12/09/22 168 lb (76.2 kg)  09/09/22 161 lb 8 oz (73.3 kg)    Physical Exam Constitutional:      General: She is not in acute distress.    Appearance: She is not toxic-appearing.  HENT:     Head: Normocephalic and atraumatic.  Pulmonary:     Effort: Pulmonary effort is normal. No respiratory distress.  Neurological:     Mental Status: She is alert and oriented to person, place, and time.  Psychiatric:        Attention and Perception: Attention normal.        Mood and Affect: Affect normal.  Affect is not inappropriate.        Speech: Speech normal.        Behavior: Behavior normal. Behavior is cooperative.           Assessment & Plan:   Encounter Diagnosis  Name Primary?   Anxiety and depression Yes     -continue current rx -continue with [facility] -pt to follow up 6 weeks.  She is to contact office sooner prn

## 2022-12-24 NOTE — Progress Notes (Signed)
This is session #10 with Samantha Khan for individual behavioral health services for anxiety. Patient presents alone. BHP spent 45 minutes with patient.    Patient reports improvement overall in previous depressive symptoms, but notes continued anxious symptoms. Anxious symptoms include autonomic hyperactivity, difficulty breathing, racing thoughts, irritability, and feelings of overwhelm. Symptoms are related to environmental barriers and interpersonal stressors. Patient will likely benefit from individual services with strengths-based intervention and coping skill development with plans to prepare for termination if symptoms continue to improve..   This procedure has been fully reviewed with the patient and informed consent has been obtained.   Patient location: Pt seen in clinic via telehealth.   I connected with  Samantha Khan on 12/23/22 by a video enabled telemedicine application and verified that I am speaking with the correct person.   I discussed the limitations of evaluation and management by telemedicine. The patient expressed understanding and agreed to proceed.     ASSESSMENT AND PLAN   Current diagnosis:  anxiety   We created the following treatment plan at today's appointment:               1)  12/23/22               2)  Maintain decrease depressive symptoms, decrease anxious symptoms through self awareness, coping skills, self-care, and supports, and increase overall level of life satisfaction and well-being.                3)  Medication management with Provider. On 08/12/22, pt shared that dose of sertraline was increased from 5mg  to 10mg , and felt that had been an improvement to her mood overall. On 12/09/2022, pt requested an increase with Provider.               4)  Next screening due in 1 session.     HISTORY OF PRESENT ILLNESS   Mental Status: Samantha Khan presented oriented X4. Mood and affect were anxious and appropriate. Judgment was appropriate. Memory was appropriate. Thought processes  and content were appropriate. Patient denies SI/HI.     Narrative: Met with pt for scheduled appointment. Person-centered approach used to continue therapeutic alliance. Pt provided updates since previous visit including interpersonal tension. Pt explored concerns . Psychoeducation provided on stress responses of fight/flight/freeze to increase understanding of symptoms tied to  interpersonal stressors. Explored motivation and provided normalization for frustrations. Created a plan to increase communication and maintain regulation. Assessed for any other concerns pt wanted to address today, which they denied. Scheduled follow up in 3 weeks.   Effectiveness of the intervention(s) and the beneficiary's response or progress toward goal(s):  Patient is in a preparation stage of change to engage in treatment plan.

## 2023-01-13 ENCOUNTER — Ambulatory Visit: Payer: Self-pay

## 2023-01-20 ENCOUNTER — Ambulatory Visit: Payer: Self-pay

## 2023-01-27 IMAGING — CT CT ABD-PELV W/ CM
2 of 4 series · 16 of 46 positions shown, 18 images · IV contrast (Omnipaque or Isovue)
Comparison: None.

CLINICAL DATA: Abdominal pain, acute, nonlocalized diffuse
abdominal pain. Diarrhea

EXAM:
CT ABDOMEN AND PELVIS WITH CONTRAST
TECHNIQUE: Multidetector CT imaging of the abdomen and pelvis was performed
using the standard protocol following bolus administration of
intravenous contrast.

[Series 2: axial st · axial · 0.87mm/px · z∈[+1458,+1918]mm · 13 of 102 slices shown, 15 images]
[im 5/102  soft-tissue]
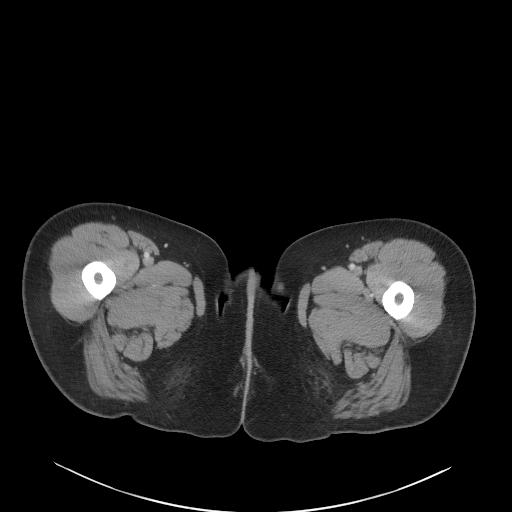
[im 5/102  bone]
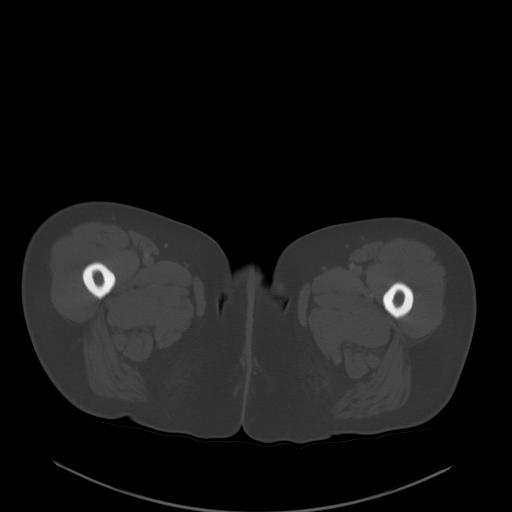
[im 13/102  soft-tissue]
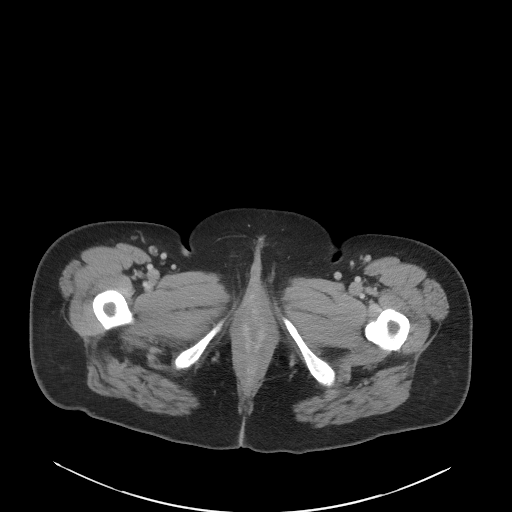
[im 21/102  soft-tissue]
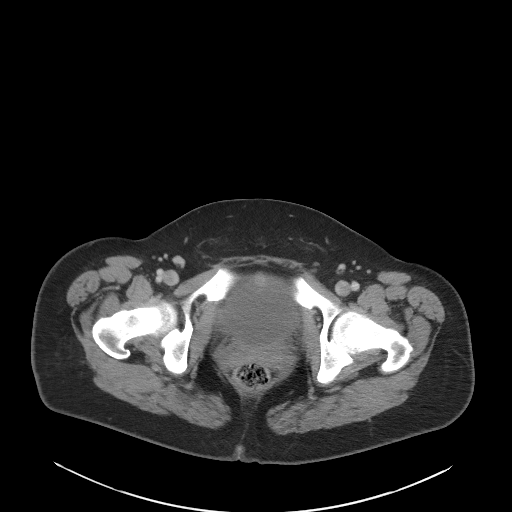
[im 29/102  soft-tissue]
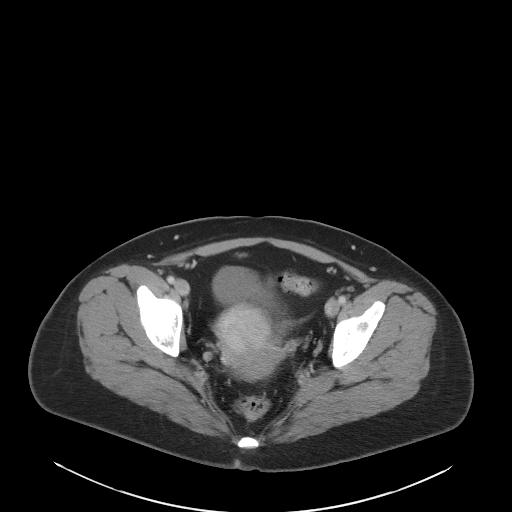
[im 37/102  soft-tissue]
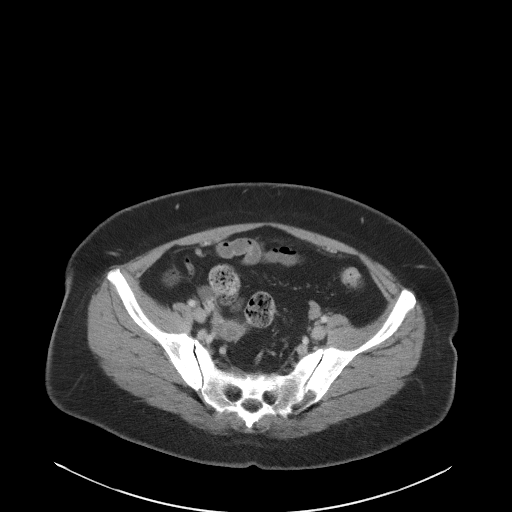
[im 45/102  soft-tissue]
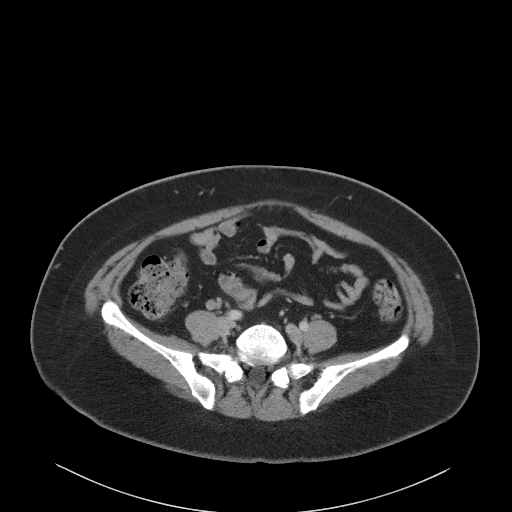
[im 53/102  soft-tissue]
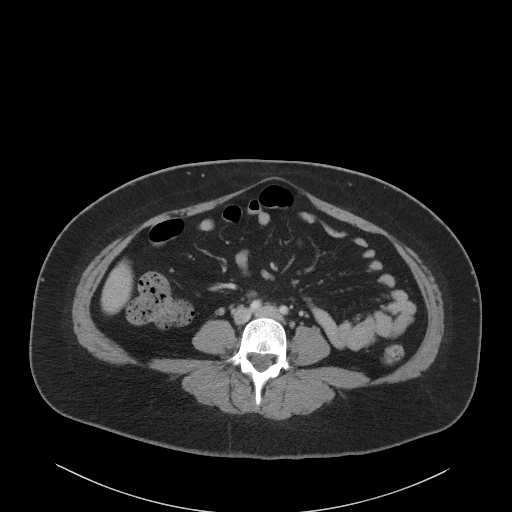
[im 57/102  soft-tissue]
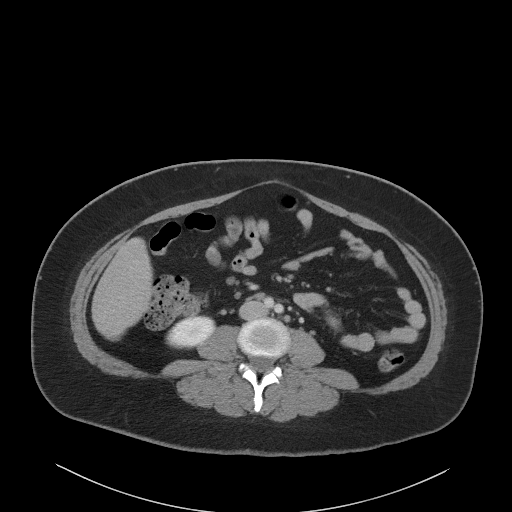
[im 65/102  soft-tissue]
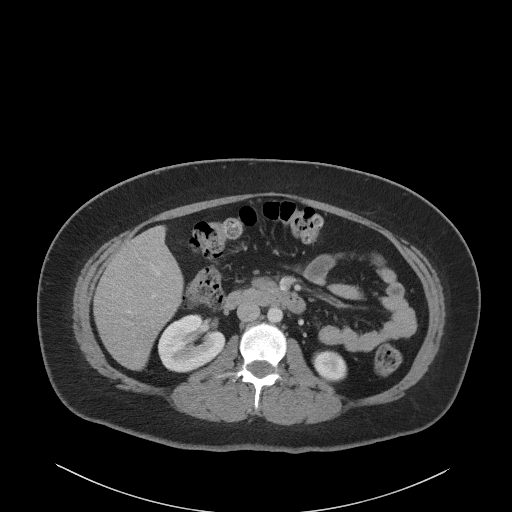
[im 65/102  bone]
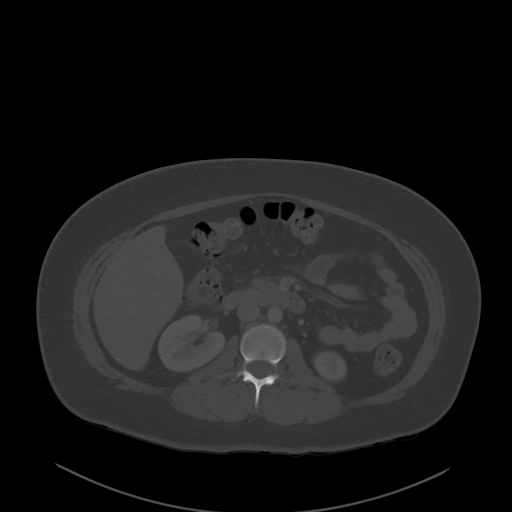
[im 73/102  soft-tissue]
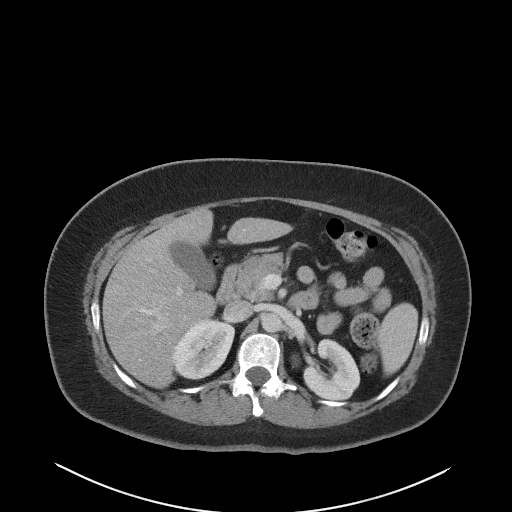
[im 81/102  soft-tissue]
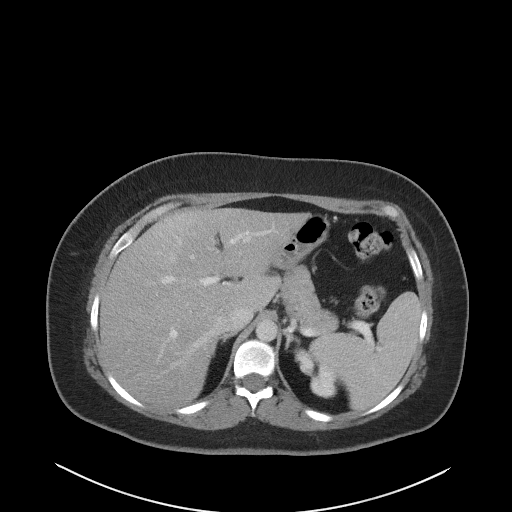
[im 89/102  soft-tissue]
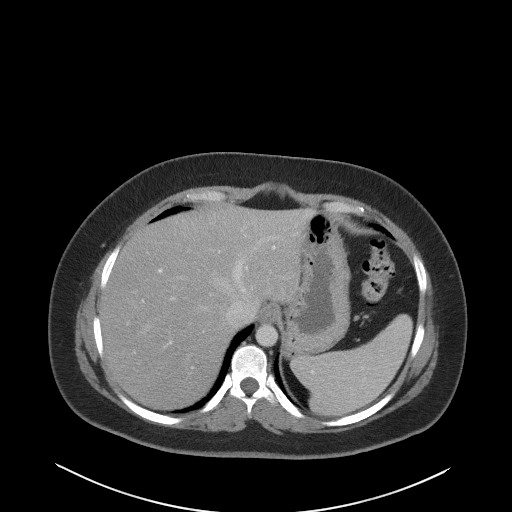
[im 97/102  soft-tissue]
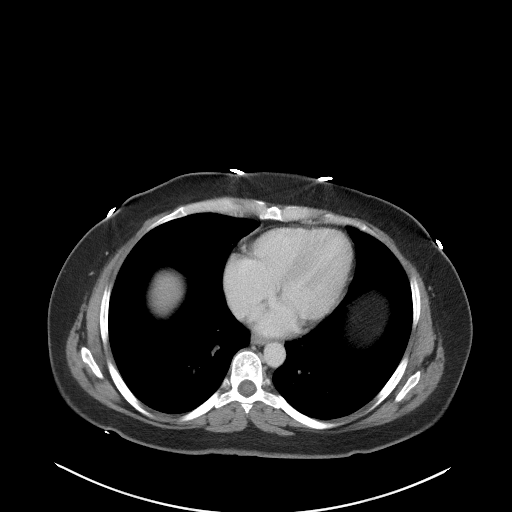

[Series 5: coronal st · coronal · 0.80mm/px · 3 of 93 slices shown]
[im 31/93  soft-tissue]
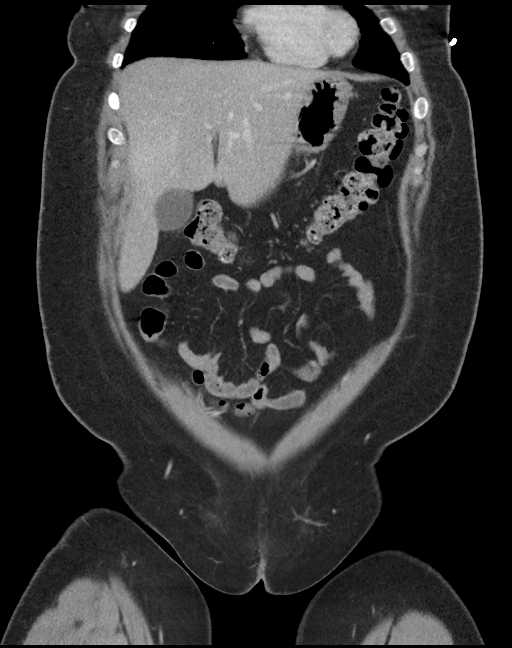
[im 41/93  soft-tissue]
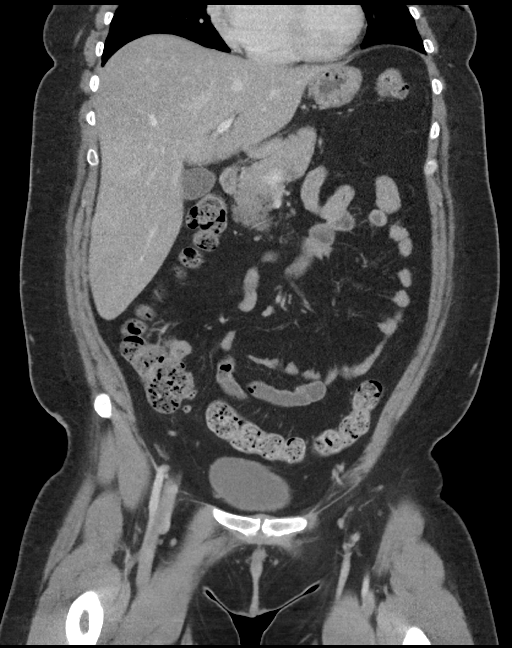
[im 52/93  soft-tissue]
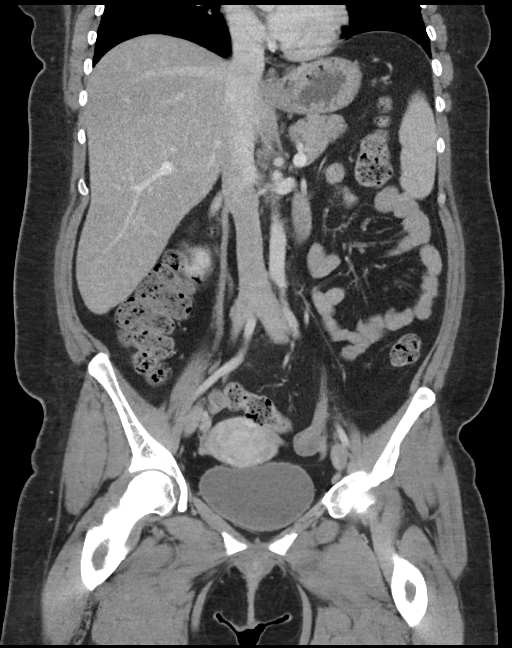

[16 of 46 positions shown; findings below may reference images not displayed]

RADIATION DOSE REDUCTION: This exam was performed according to the
departmental dose-optimization program which includes automated
exposure control, adjustment of the mA and/or kV according to
patient size and/or use of iterative reconstruction technique.

CONTRAST:  100mL OMNIPAQUE IOHEXOL 300 MG/ML  SOLN
FINDINGS: Lower chest: No acute abnormality.

Hepatobiliary: Mild hepatomegaly and hepatic steatosis. No enhancing
intrahepatic mass. No intra or extrahepatic biliary ductal dilation.
Gallbladder unremarkable.

Pancreas: Unremarkable

Spleen: Mild splenomegaly with the spleen measuring 13.5 cm in
greatest dimension. No focal intrasplenic lesion identified. Splenic
vein is patent.

Adrenals/Urinary Tract: The adrenal glands are unremarkable. The
kidneys are normal in position. There is mild cortical scarring
involving the upper pole the left kidney. No enhancing cortical
masses. No hydronephrosis. 2 mm nonobstructing calculus noted within
the lower pole of the right kidney. No ureteral calculi. The bladder
is unremarkable.

Stomach/Bowel: Stomach is within normal limits. Appendix appears
normal. No evidence of bowel wall thickening, distention, or
inflammatory changes.

Vascular/Lymphatic: No significant vascular findings are present. No
enlarged abdominal or pelvic lymph nodes.

Reproductive: Uterus and bilateral adnexa are unremarkable.

Other: No abdominal wall hernia or abnormality. No abdominopelvic
ascites.

Musculoskeletal: No acute or significant osseous findings.
IMPRESSION: No acute intra-abdominal pathology. No definite radiographic
explanation for the patient's reported symptoms.

Mild hepatic steatosis.  Mild hepatosplenomegaly.

Minimal right nonobstructing nephrolithiasis

## 2023-02-03 ENCOUNTER — Ambulatory Visit: Payer: Self-pay | Admitting: Physician Assistant

## 2023-02-03 ENCOUNTER — Ambulatory Visit: Payer: Self-pay

## 2023-02-03 ENCOUNTER — Encounter: Payer: Self-pay | Admitting: Physician Assistant

## 2023-02-03 VITALS — BP 100/66 | HR 83 | Temp 97.7°F | Ht 60.0 in | Wt 167.0 lb

## 2023-02-03 DIAGNOSIS — R42 Dizziness and giddiness: Secondary | ICD-10-CM

## 2023-02-03 DIAGNOSIS — F419 Anxiety disorder, unspecified: Secondary | ICD-10-CM

## 2023-02-03 MED ORDER — MECLIZINE HCL 25 MG PO TABS: 25.0000 mg | ORAL_TABLET | Freq: Three times a day (TID) | ORAL | 0 refills | Status: DC | PRN

## 2023-02-03 MED ORDER — SERTRALINE HCL 100 MG PO TABS: 100.0000 mg | ORAL_TABLET | Freq: Two times a day (BID) | ORAL | 2 refills | Status: DC

## 2023-02-03 NOTE — Progress Notes (Signed)
BP 100/66   Pulse 83   Temp 97.7 F (36.5 C)   Ht 5' (1.524 m)   Wt 167 lb (75.8 kg)   SpO2 98%   BMI 32.61 kg/m    Subjective:    Patient ID: Samantha Khan, female    DOB: 07-03-1991, 32 y.o.   MRN: [id number]  HPI: [name] is a 32 y.o. female presenting on 02/03/2023 for Mental Health Problem and Dizziness (Pt has been feeling dizzy for the past 3 weeks with HA. Pt states dizziness happens at random time when she is sitting, then suddenly feels dizzy. Pt states dizzines happens 2x day, 2x a week. Pt states she wakes up with HA, pt states she takes tylenol which eases the pain but does not rid it. Pt denies blurred vision.)   HPI   Chief Complaint  Patient presents with   Mental Health Problem   Dizziness    Pt has been feeling dizzy for the past 3 weeks with HA. Pt states dizziness happens at random time when she is sitting, then suddenly feels dizzy. Pt states dizzines happens 2x day, 2x a week. Pt states she wakes up with HA, pt states she takes tylenol which eases the pain but does not rid it. Pt denies blurred vision.    Pt says dizziness feels like spinning.  She says she is sleeping well.  She is not having depression.  She says she is still anxious and thinks increase in medication may help.  She is not having any CP or sob.  The head hurts all over when it hurts.   She has had no N/V   her symptoms have not changed her daily activity.  Relevant past medical, surgical, family and social history reviewed and updated as indicated. Interim medical history since our last visit reviewed. Allergies and medications reviewed and updated.    Current Outpatient Medications:    medroxyPROGESTERone (DEPO-PROVERA) 150 MG/ML injection, Inject 150 mg into the muscle every 3 (three) months., Disp: , Rfl:    Prenatal Vit-Fe Fumarate-FA (PRENATAL VITAMIN PO), Take by mouth., Disp: , Rfl:    sertraline (ZOLOFT) 100 MG tablet, Take 1 tablet (100 mg total) by mouth at bedtime.,  Disp: 30 tablet, Rfl: 3   sertraline (ZOLOFT) 50 MG tablet, Take 1 tablet (50 mg total) by mouth every morning., Disp: 30 tablet, Rfl: 3    Review of Systems  Per HPI unless specifically indicated above     Objective:    BP 100/66   Pulse 83   Temp 97.7 F (36.5 C)   Ht 5' (1.524 m)   Wt 167 lb (75.8 kg)   SpO2 98%   BMI 32.61 kg/m   Wt Readings from Last 3 Encounters:  02/03/23 167 lb (75.8 kg)  12/23/22 166 lb (75.3 kg)  12/09/22 168 lb (76.2 kg)    Orthostatic VS for the past 24 hrs (Last 3 readings):  BP- Lying Pulse- Lying BP- Sitting Pulse- Sitting BP- Standing at 0 minutes Pulse- Standing at 0 minutes  02/03/23 1054 95/59 65 (!) 89/57 67 92/56 71     Physical Exam Vitals reviewed.  Constitutional:      General: She is not in acute distress.    Appearance: She is well-developed. She is not ill-appearing or toxic-appearing.  HENT:     Head: Normocephalic and atraumatic.     Right Ear: Tympanic membrane, ear canal and external ear normal.     Left Ear: Tympanic membrane, ear  canal and external ear normal.  Eyes:     Extraocular Movements: Extraocular movements intact.     Conjunctiva/sclera: Conjunctivae normal.     Pupils: Pupils are equal, round, and reactive to light.  Cardiovascular:     Rate and Rhythm: Normal rate and regular rhythm.     Pulses: Normal pulses.     Heart sounds: Normal heart sounds.  Pulmonary:     Effort: Pulmonary effort is normal.     Breath sounds: Normal breath sounds.  Abdominal:     General: Bowel sounds are normal.     Palpations: Abdomen is soft. There is no mass.     Tenderness: There is no abdominal tenderness.  Musculoskeletal:     Cervical back: Neck supple.     Right lower leg: No edema.     Left lower leg: No edema.  Lymphadenopathy:     Cervical: No cervical adenopathy.  Skin:    General: Skin is warm and dry.  Neurological:     Mental Status: She is alert and oriented to person, place, and time.     Cranial  Nerves: Cranial nerves 2-12 are intact. No facial asymmetry.     Motor: No weakness or tremor.     Coordination: Coordination normal. Heel to Shin Test normal.     Gait: Gait is intact. Gait normal.     Deep Tendon Reflexes:     Reflex Scores:      Patellar reflexes are 2+ on the right side and 2+ on the left side. Psychiatric:        Behavior: Behavior normal.         Assessment & Plan:    Encounter Diagnoses  Name Primary?   Anxiety Yes   Vertigo      Increase sertraline to max 200mg /day.  Rx meclizine to use prn dizziness.  Pt was counseled that the meclizine may cause sedation and to use caution.  She is encouraged to get plenty of sleep and to drink 6-8 glasses water/day.   She will follow up in 2 weeks for recheck.  She is to contact office sooner for new symptoms or for worsening.  She has appointment with [facility] today.   She is reminded to update enrollment.

## 2023-02-03 NOTE — Patient Instructions (Addendum)
Update enrollment- [facility]

## 2023-02-03 NOTE — Progress Notes (Signed)
This is session #11 with Samantha Khan for individual behavioral health services for anxiety. Patient presents alone. BHP spent 45 minutes with patient.    Patient reports improvement overall in previous depressive symptoms, but notes continued anxious symptoms. Anxious symptoms include autonomic hyperactivity, difficulty breathing, racing thoughts, irritability, and feelings of overwhelm. Symptoms are related to environmental barriers and interpersonal stressors. Patient will likely benefit from individual services with strengths-based intervention and coping skill development..   This procedure has been fully reviewed with the patient and informed consent has been obtained.   Patient location: Pt seen in clinic via telehealth.   I connected with  Samantha Khan on 02/03/23 by a video enabled telemedicine application and verified that I am speaking with the correct person.   I discussed the limitations of evaluation and management by telemedicine. The patient expressed understanding and agreed to proceed.     ASSESSMENT AND PLAN   Current diagnosis:  anxiety   We created the following treatment plan at today's appointment:               1)  02/03/23               2)  Maintain decrease depressive symptoms, decrease anxious symptoms through self awareness, coping skills, self-care, and supports, and increase overall level of life satisfaction and well-being.                3)  Medication management with Provider. On 08/12/22, pt shared that dose of sertraline was increased from 5mg  to 10mg , and felt that had been an improvement to her mood overall. On 12/09/2022, pt requested an increase with Provider. On 02/03/23 asked Provider about adjusting medication due to headaches.               4)  Next screening due in 1 session.     HISTORY OF PRESENT ILLNESS   Mental Status: Samantha Khan presented oriented X4. Mood and affect were anxious and appropriate. Judgment was appropriate. Memory was appropriate. Thought processes  and content were appropriate. Patient denies SI/HI.     Narrative: Met with pt for scheduled appointment. Person-centered approach used to continue therapeutic alliance. Pt provided updates since previous visit including positive birthday celebration with daughter and interpersonal tension. Pt explored interpersonal concerns and feelings of hurt. Noted connection of hurt to protective defenses. Identified themes of low confidence/assertiveness, boundaries, and negative cognitions. Introduced perspective shift, asking "what has gone well?" to limit negativity bias and impact to mood and esteem. Scheduled follow up in 3 weeks.   Effectiveness of the intervention(s) and the beneficiary's response or progress toward goal(s):  Patient is in a preparation stage of change to engage in treatment plan.

## 2023-02-17 ENCOUNTER — Ambulatory Visit: Payer: Self-pay

## 2023-02-17 ENCOUNTER — Ambulatory Visit: Payer: Self-pay | Admitting: Physician Assistant

## 2023-03-03 ENCOUNTER — Ambulatory Visit: Payer: Self-pay | Admitting: Physician Assistant

## 2023-03-03 ENCOUNTER — Encounter: Payer: Self-pay | Admitting: Physician Assistant

## 2023-03-03 ENCOUNTER — Ambulatory Visit: Payer: Self-pay

## 2023-03-03 VITALS — BP 103/58 | HR 82 | Temp 97.8°F | Ht 60.0 in | Wt 176.0 lb

## 2023-03-03 DIAGNOSIS — Z3042 Encounter for surveillance of injectable contraceptive: Secondary | ICD-10-CM

## 2023-03-03 DIAGNOSIS — F419 Anxiety disorder, unspecified: Secondary | ICD-10-CM

## 2023-03-03 MED ORDER — MEDROXYPROGESTERONE ACETATE 150 MG/ML IM SUSY
150.0000 mg | PREFILLED_SYRINGE | Freq: Once | INTRAMUSCULAR | Status: AC
  Administered 2023-03-03: 150 mg via INTRAMUSCULAR

## 2023-03-03 NOTE — Progress Notes (Signed)
This is session #12 with Samantha Khan for individual behavioral health services for anxiety. Patient presents alone. BHP spent 25 minutes with patient.    Patient reports anxious symptoms include autonomic hyperactivity, difficulty breathing, racing thoughts, irritability, and feelings of overwhelm. Symptoms are related to environmental barriers and interpersonal stressors. Patient will likely benefit from individual services with strengths-based intervention and coping skill development..   This procedure has been fully reviewed with the patient and informed consent has been obtained.   Patient location: Pt seen in clinic via telehealth.   I connected with  Samantha Khan on 03/03/23 by a video enabled telemedicine application and verified that I am speaking with the correct person.   I discussed the limitations of evaluation and management by telemedicine. The patient expressed understanding and agreed to proceed.     ASSESSMENT AND PLAN   Current diagnosis:  anxiety   We created the following treatment plan at today's appointment:               1)  03/03/23               2)  Maintain decrease depressive symptoms, decrease anxious symptoms through self awareness, coping skills, self-care, and supports, and increase overall level of life satisfaction and well-being.                3)  Medication management with Provider. On 08/12/22, pt shared that dose of sertraline was increased from 5mg  to 10mg , and felt that had been an improvement to her mood overall. On 12/09/2022, pt requested an increase with Provider. On 02/03/23 asked Provider about adjusting medication due to headaches.               4)  Next screening due in 1 session.     HISTORY OF PRESENT ILLNESS   Mental Status: Samantha Khan presented oriented X4. Mood and affect were anxious and appropriate. Judgment was appropriate. Memory was appropriate. Thought processes and content were appropriate. Patient denies SI/HI.     Narrative: Met with pt for  scheduled appointment. Person-centered approach used to continue therapeutic alliance. 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 Identified interpersonal concern and created a plan to increase communication and manage anger. Pt explored moment of overwhelm with negative cognitions tied to anger and crying. CBT used to explore cognitions and impact on emotions as well as question the validity of cognitions. Disclosure used to normalize experience. As cognitions are related to low confidence, created a plan to intentionally notice moments where pt is proud of herself. Scheduled follow up in 2 weeks.   Effectiveness of the intervention(s) and the beneficiary's response or progress toward goal(s):  Patient is in a preparation stage of change to engage in treatment plan.

## 2023-03-03 NOTE — Patient Instructions (Addendum)
Don't forget to update your enrollment! No olvides actualizar tu inscripcin!

## 2023-03-03 NOTE — Progress Notes (Unsigned)
BP (!) 103/58   Pulse 82   Temp 97.8 F (36.6 C)   Ht 5' (1.524 m)   Wt 176 lb (79.8 kg)   SpO2 98%   BMI 34.37 kg/m    Subjective:    Patient ID: Samantha Khan, female    DOB: [birth year], 32 y.o.   MRN: [id number]  HPI: Samantha Khan is a 32 y.o. female presenting on 03/03/2023 for Follow-up (Pt states the first month (June) after getting depo she did not have a cycle, in July her cycle lasted 3 weeks with the same amount of bleeding for all days with light bleeding with some clots. Pt states she is due to have her cycle soon for the month of August.)   HPI  Chief Complaint  Patient presents with   Follow-up    Pt states the first month (June) after getting depo she did not have a cycle, in July her cycle lasted 3 weeks with the same amount of bleeding for all days with light bleeding with some clots. Pt states she is due to have her cycle soon for the month of August.    Pt says the Increased sertaline helped.  She is still having anxiety and depression but says she feels better able to deal with it.  She continues with [facility].   She has no SI, HI.    Relevant past medical, surgical, family and social history reviewed and updated as indicated. Interim medical history since our last visit reviewed. Allergies and medications reviewed and updated.   Current Outpatient Medications:    meclizine (ANTIVERT) 25 MG tablet, Take 1 tablet (25 mg total) by mouth 3 (three) times daily as needed for dizziness., Disp: 30 tablet, Rfl: 0   medroxyPROGESTERone (DEPO-PROVERA) 150 MG/ML injection, Inject 150 mg into the muscle every 3 (three) months., Disp: , Rfl:    Prenatal Vit-Fe Fumarate-FA (PRENATAL VITAMIN PO), Take by mouth., Disp: , Rfl:    sertraline (ZOLOFT) 100 MG tablet, Take 1 tablet (100 mg total) by mouth 2 (two) times daily., Disp: 60 tablet, Rfl: 2    Review of Systems  Per HPI unless specifically indicated above     Objective:    BP (!) 103/58   Pulse 82   Temp  97.8 F (36.6 C)   Ht 5' (1.524 m)   Wt 176 lb (79.8 kg)   SpO2 98%   BMI 34.37 kg/m   Wt Readings from Last 3 Encounters:  03/03/23 176 lb (79.8 kg)  02/03/23 167 lb (75.8 kg)  12/23/22 166 lb (75.3 kg)    Physical Exam Vitals reviewed.  Constitutional:      General: She is not in acute distress.    Appearance: She is well-developed. She is not toxic-appearing.  HENT:     Head: Normocephalic and atraumatic.  Cardiovascular:     Rate and Rhythm: Normal rate and regular rhythm.  Pulmonary:     Effort: Pulmonary effort is normal.     Breath sounds: Normal breath sounds.  Abdominal:     General: Bowel sounds are normal.     Palpations: Abdomen is soft. There is no mass.     Tenderness: There is no abdominal tenderness.  Musculoskeletal:     Cervical back: Neck supple.  Lymphadenopathy:     Cervical: No cervical adenopathy.  Skin:    General: Skin is warm and dry.  Neurological:     Mental Status: She is alert and oriented to person, place, and time.  Psychiatric:        Attention and Perception: Attention normal.        Mood and Affect: Affect is not inappropriate.        Speech: Speech normal.        Behavior: Behavior normal. Behavior is cooperative.           Assessment & Plan:   Encounter Diagnoses  Name Primary?   Anxiety and depression Yes   Encounter for management and injection of injectable progestin contraceptive      Anxiety/depression Pt to continue sertraline and counseling with [facility]  Contraception Pt received second depo-provera injection today  -pt is reminded again to update her enrollment -pt to follow up 12 weeks for recheck mood and depo  she is to contact office sooner prn

## 2023-03-17 ENCOUNTER — Ambulatory Visit: Payer: Self-pay

## 2023-03-24 ENCOUNTER — Ambulatory Visit: Payer: Self-pay

## 2023-03-24 DIAGNOSIS — F419 Anxiety disorder, unspecified: Secondary | ICD-10-CM

## 2023-03-24 NOTE — Progress Notes (Signed)
This is session #13 with Samantha Khan for individual behavioral health services for anxiety. Patient presents alone. BHP spent 40 minutes with patient.    Patient reports anxious symptoms include autonomic hyperactivity, difficulty breathing, racing thoughts, irritability, and feelings of overwhelm. Symptoms are related to environmental barriers and interpersonal stressors. Patient will likely benefit from individual services with strengths-based intervention and coping skill development..   This procedure has been fully reviewed with the patient and informed consent has been obtained.   Patient location: Pt seen in clinic via telehealth.   I connected with  [name] on 03/24/23 by a video enabled telemedicine application and verified that I am speaking with the correct person.   I discussed the limitations of evaluation and management by telemedicine. The patient expressed understanding and agreed to proceed.     ASSESSMENT AND PLAN   Current diagnosis:  anxiety   We created the following treatment plan at today's appointment:               1)  03/24/23               2)  Maintain decrease depressive symptoms, decrease anxious symptoms through self awareness, coping skills, self-care, and supports, and increase overall level of life satisfaction and well-being.                3)  Medication management with Provider. On 08/12/22, pt shared that dose of sertraline was increased from 5mg  to 10mg , and felt that had been an improvement to her mood overall. On 12/09/2022, pt requested an increase with Provider. On 02/03/23 asked Provider about adjusting medication due to headaches.               4)  Next screening due in 1 session.     HISTORY OF PRESENT ILLNESS   Mental Status: [name] presented oriented X4. Mood and affect were anxious and appropriate. Judgment was appropriate. Memory was appropriate. Thought processes and content were appropriate. Patient denies SI/HI.     Narrative: Met with pt for  scheduled appointment. Person-centered approach used to continue therapeutic alliance. Pt provided updates since previous visit including upcoming surgery, financial and interpersonal concerns. Explored concerns and identified related emotions and cognitions. Created a plan to increase communication through psychoeduation around love languages, and reframes of perspective and assertiveness. Scheduled follow up in 2 weeks.   Effectiveness of the intervention(s) and the beneficiary's response or progress toward goal(s):  Patient is in a preparation stage of change to engage in treatment plan.

## 2023-04-07 ENCOUNTER — Ambulatory Visit: Payer: Self-pay

## 2023-04-14 ENCOUNTER — Ambulatory Visit: Payer: Self-pay

## 2023-04-14 DIAGNOSIS — F419 Anxiety disorder, unspecified: Secondary | ICD-10-CM

## 2023-04-14 NOTE — Progress Notes (Signed)
This is session #14 with [facility] for individual behavioral health services for anxiety. Patient presents alone. BHP spent 40 minutes with patient.    Patient reports anxious symptoms include autonomic hyperactivity, difficulty breathing, racing thoughts, irritability, and feelings of overwhelm. Symptoms are related to environmental barriers and interpersonal stressors. Patient will likely benefit from individual services with strengths-based intervention and coping skill development..   This procedure has been fully reviewed with the patient and informed consent has been obtained.   Patient location: Pt seen in clinic via telehealth.   I connected with  Samantha Khan on 04/14/23 by a video enabled telemedicine application and verified that I am speaking with the correct person.   I discussed the limitations of evaluation and management by telemedicine. The patient expressed understanding and agreed to proceed.     ASSESSMENT AND PLAN   Current diagnosis:  anxiety   We created the following treatment plan at today's appointment:               1)  04/14/23               2)  Maintain decreased depressive symptoms, decrease anxious symptoms through self awareness, coping skills, self-care, and supports, and increase overall level of life satisfaction and well-being.                3)  Medication management with Provider. On 08/12/22, pt shared that dose of sertraline was increased from 5mg  to 10mg , and felt that had been an improvement to her mood overall. On 12/09/2022, pt requested an increase with Provider. On 02/03/23 asked Provider about adjusting medication due to headaches.               4)  Next screening due in 0 session.     HISTORY OF PRESENT ILLNESS   Mental Status: Samantha Khan presented oriented X4. Mood and affect were anxious and appropriate. Judgment was appropriate. Memory was appropriate. Thought processes and content were appropriate. Patient denies SI/HI.     Narrative: Met with pt for  scheduled appointment. Person-centered approach used to continue therapeutic alliance. Pt provided updates since previous visit including recent surgery, and interpersonal concerns. Provided space to process stress around day of surgery. Explored concerns and identified related emotions and cognitions. Affirmed positive mantras and self-talk, and provided reflections of experience. Created a plan to continue building confidence and boundaries while increasing protective factors. Scheduled follow up.   Effectiveness of the intervention(s) and the beneficiary's response or progress toward goal(s):  Patient is in a contemplative stage of change to engage in treatment plan.

## 2023-04-24 ENCOUNTER — Other Ambulatory Visit: Payer: Self-pay | Admitting: Physician Assistant

## 2023-04-28 ENCOUNTER — Telehealth: Payer: Self-pay

## 2023-04-28 NOTE — Telephone Encounter (Signed)
Attempted wellness/follow up with interpreter services. No answer, left VM requesting return call to [facility]. Client needs to renew enrollment.   Next appointment at [facility] 05/05/23   Francee Nodal RN  Clara Gunn/[facility]

## 2023-05-05 ENCOUNTER — Ambulatory Visit: Payer: Self-pay

## 2023-05-26 ENCOUNTER — Ambulatory Visit: Payer: Self-pay

## 2023-05-26 ENCOUNTER — Ambulatory Visit: Payer: Self-pay | Admitting: Physician Assistant

## 2023-05-26 DIAGNOSIS — Z3042 Encounter for surveillance of injectable contraceptive: Secondary | ICD-10-CM

## 2023-05-26 DIAGNOSIS — F419 Anxiety disorder, unspecified: Secondary | ICD-10-CM

## 2023-05-26 MED ORDER — MEDROXYPROGESTERONE ACETATE 150 MG/ML IM SUSP
150.0000 mg | Freq: Once | INTRAMUSCULAR | Status: AC
  Administered 2023-05-26: 150 mg via INTRAMUSCULAR

## 2023-05-26 MED ORDER — METHYLPREDNISOLONE ACETATE 80 MG/ML IJ SUSP: 150.0000 mg | Freq: Once | INTRAMUSCULAR | Status: DC

## 2023-05-26 NOTE — Progress Notes (Signed)
Pt is here today for 3 month depo shot. Pt states she is doing well.  MedroxyPROGESTERone 150mg /ml was administered and documented in pt's MAR.  Pt to f/u in 12 weeks for her next dose.

## 2023-05-26 NOTE — Progress Notes (Signed)
This is session #15 with Samantha Khan for individual behavioral health services for anxiety. Patient presents alone. BHP spent 30 minutes with patient.    Patient reports anxious symptoms include racing thoughts, irritability, and feelings of overwhelm. Symptoms are related to environmental barriers and interpersonal stressors. Patient will likely benefit from individual services with strengths-based intervention and coping skill development..   This procedure has been fully reviewed with the patient and informed consent has been obtained.   Patient location: Pt seen in clinic via telehealth.   I connected with  Samantha Khan on 05/26/23 by a video enabled telemedicine application and verified that I am speaking with the correct person.   I discussed the limitations of evaluation and management by telemedicine. The patient expressed understanding and agreed to proceed.     ASSESSMENT AND PLAN   Current diagnosis:  anxiety   We created the following treatment plan at today's appointment:               1)  05/26/23               2)  Maintain decreased depressive symptoms, decrease anxious symptoms through self awareness, coping skills, self-care, and supports, and increase overall level of life satisfaction and well-being.                3)  Medication management with Provider. On 08/12/22, pt shared that dose of sertraline was increased from 5mg  to 10mg , and felt that had been an improvement to her mood overall. On 12/09/2022, pt requested an increase with Provider. On 02/03/23 asked Provider about adjusting medication due to headaches.               4)  Next screening due in 0 session.     HISTORY OF PRESENT ILLNESS   Mental Status: Samantha Khan presented oriented X4. Mood and affect were anxious and appropriate. Judgment was appropriate. Memory was appropriate. Thought processes and content were appropriate. Patient denies SI/HI.     Narrative: Met with pt for scheduled appointment. Person-centered approach  used to continue therapeutic alliance. Pt provided updates since previous visit including interpersonal concerns. Provided support and explored concerns. Created a plan to decrease anger through perspective shifts and intentional pauses. Reframed goals to include protecting her own peace and that of her household. Scheduled follow up.   Effectiveness of the intervention(s) and the beneficiary's response or progress toward goal(s):  Patient is in a contemplative stage of change to engage in treatment plan.

## 2023-06-11 ENCOUNTER — Other Ambulatory Visit: Payer: Self-pay | Admitting: Physician Assistant

## 2023-06-23 ENCOUNTER — Ambulatory Visit: Payer: Self-pay | Admitting: Physician Assistant

## 2023-06-23 ENCOUNTER — Encounter: Payer: Self-pay | Admitting: Physician Assistant

## 2023-06-23 ENCOUNTER — Ambulatory Visit: Payer: Self-pay

## 2023-06-23 VITALS — BP 102/66 | HR 97 | Temp 97.9°F | Ht 60.0 in | Wt 163.2 lb

## 2023-06-23 DIAGNOSIS — F419 Anxiety disorder, unspecified: Secondary | ICD-10-CM

## 2023-06-23 MED ORDER — SERTRALINE HCL 100 MG PO TABS: 100.0000 mg | ORAL_TABLET | Freq: Two times a day (BID) | ORAL | 1 refills | Status: DC

## 2023-06-23 NOTE — Progress Notes (Signed)
This is session #15 with Samantha Khan for individual behavioral health services for anxiety. Patient presents alone. BHP spent 45 minutes with patient.    Patient reports anxious symptoms include racing thoughts, irritability, and feelings of overwhelm. Symptoms are related to environmental barriers and interpersonal stressors. Patient will likely benefit from individual services with strengths-based intervention and coping skill development..   This procedure has been fully reviewed with the patient and informed consent has been obtained.   Patient location: Pt seen in clinic via telehealth.   I connected with  Samantha Khan on 06/23/23 by a video enabled telemedicine application and verified that I am speaking with the correct person.   I discussed the limitations of evaluation and management by telemedicine. The patient expressed understanding and agreed to proceed.     ASSESSMENT AND PLAN   Current diagnosis:  anxiety   We created the following treatment plan at today's appointment:               1)  06/23/23               2)  Maintain decreased depressive symptoms, decrease anxious symptoms through self awareness, coping skills, self-care, and supports, and increase overall level of life satisfaction and well-being.                3)  Medication management with Provider. On 08/12/22, pt shared that dose of sertraline was increased from 5mg  to 10mg , and felt that had been an improvement to her mood overall. On 12/09/2022, pt requested an increase with Provider. On 02/03/23 asked Provider about adjusting medication due to headaches. 06/23/23 pt requested increase in sertraline.               4)  Next screening due in 0 session.     HISTORY OF PRESENT ILLNESS   Mental Status: Samantha Khan presented oriented X4. Mood and affect were anxious. Judgment was appropriate. Memory was appropriate. Thought processes and content were appropriate. Patient denies SI/HI.     Narrative: Met with pt for scheduled  appointment. Person-centered approach used to continue therapeutic alliance. Pt provided updates since previous visit including interpersonal concerns. Provided support and explored feelings of anger, guilt, and sadness. Identified increased stressors contributing to overwhelm.  Normalized experience and highlighted growth. Scheduled follow up.   Effectiveness of the intervention(s) and the beneficiary's response or progress toward goal(s):  Patient is in a contemplative stage of change to engage in treatment plan.

## 2023-06-23 NOTE — Progress Notes (Signed)
   BP 102/66   Pulse 97   Temp 97.9 F (36.6 C)   Ht 5' (1.524 m)   Wt 163 lb 4 oz (74 kg)   SpO2 97%   BMI 31.88 kg/m    Subjective:    Patient ID: Samantha Khan, female    DOB: [birth year], 32 y.o.   MRN: [id number]  HPI: Samantha Khan is a 32 y.o. female presenting on 06/23/2023 for Follow-up   HPI  She is still anxious but says less than before she was on the sertraline  She says she still has Anxious and times when she Yells and cries.   She has been working with [facility] but still feels like hard time coping.  She denies SI, HI.  She says she never gets mad at her children but at others in her life.   She is taking 200mg  sertraline every day.    Relevant past medical, surgical, family and social history reviewed and updated as indicated. Interim medical history since our last visit reviewed. Allergies and medications reviewed and updated.   Current Outpatient Medications:    meclizine (ANTIVERT) 25 MG tablet, Take 1 tablet (25 mg total) by mouth 3 (three) times daily as needed for dizziness., Disp: 30 tablet, Rfl: 0   medroxyPROGESTERone (DEPO-PROVERA) 150 MG/ML injection, Inject 150 mg into the muscle every 3 (three) months., Disp: , Rfl:    Prenatal Vit-Fe Fumarate-FA (PRENATAL VITAMIN PO), Take by mouth., Disp: , Rfl:    sertraline (ZOLOFT) 100 MG tablet, Take 1 tablet by mouth twice daily, Disp: 60 tablet, Rfl: 0    Review of Systems  Per HPI unless specifically indicated above     Objective:    BP 102/66   Pulse 97   Temp 97.9 F (36.6 C)   Ht 5' (1.524 m)   Wt 163 lb 4 oz (74 kg)   SpO2 97%   BMI 31.88 kg/m   Wt Readings from Last 3 Encounters:  06/23/23 163 lb 4 oz (74 kg)  03/03/23 176 lb (79.8 kg)  02/03/23 167 lb (75.8 kg)    Physical Exam Vitals reviewed.  Constitutional:      General: She is not in acute distress.    Appearance: She is well-developed. She is not toxic-appearing.  HENT:     Head: Normocephalic and atraumatic.   Cardiovascular:     Rate and Rhythm: Normal rate and regular rhythm.  Pulmonary:     Effort: Pulmonary effort is normal.     Breath sounds: Normal breath sounds.  Musculoskeletal:     Cervical back: Neck supple.  Lymphadenopathy:     Cervical: No cervical adenopathy.  Skin:    General: Skin is warm and dry.  Neurological:     Mental Status: She is alert and oriented to person, place, and time.  Psychiatric:        Attention and Perception: Attention normal.        Behavior: Behavior normal. Behavior is cooperative.           Assessment & Plan:     Encounter Diagnosis  Name Primary?   Anxiety and depression Yes    Pt would like to stay on current sertraline for now.  She will follow up in 7 weeks (when she comes in for her depo).  She is to contact office sooner if she would like

## 2023-07-14 ENCOUNTER — Ambulatory Visit: Payer: Self-pay

## 2023-07-28 ENCOUNTER — Ambulatory Visit: Payer: Self-pay

## 2023-07-28 DIAGNOSIS — F32A Depression, unspecified: Secondary | ICD-10-CM

## 2023-07-28 NOTE — Progress Notes (Signed)
This is session #16 with Samantha Khan for individual behavioral health services for anxiety. Patient presents alone with child in the room. BHP spent 50 minutes with patient.    Patient reports anxious symptoms including racing thoughts, irritability, and feelings of overwhelm. Pt names depressive symptoms including increased crying, hopelessness, and low self-worth. Symptoms are related to environmental barriers and interpersonal stressors. Patient will likely benefit from individual services with strengths-based intervention and coping skill development..   This procedure has been fully reviewed with the patient and informed consent has been obtained.   Patient location: Pt seen in clinic via telehealth.   I connected with  Samantha Khan on 07/28/23 by a video enabled telemedicine application and verified that I am speaking with the correct person.   I discussed the limitations of evaluation and management by telemedicine. The patient expressed understanding and agreed to proceed.     ASSESSMENT AND PLAN   Current diagnosis:  anxiety and depression   We created the following treatment plan at today's appointment:               1)  07/28/23               2)  Decrease anxious and depressive symptoms through self awareness, coping skills, self-care, and supports, and increase overall level of life satisfaction and well-being.                3)  Medication management with Provider. On 08/12/22, pt shared that dose of sertraline was increased from 5mg  to 10mg , and felt that had been an improvement to her mood overall. On 12/09/2022, pt requested an increase with Provider. On 02/03/23 asked Provider about adjusting medication due to headaches. 06/23/23 pt requested increase in sertraline.               4)  Next screening due in 0 session.     HISTORY OF PRESENT ILLNESS   Mental Status: Samantha Khan presented oriented X4. Mood and affect were anxious and depressed. Judgment was appropriate. Memory was appropriate.  Thought processes and content were appropriate. Patient denies SI/HI.     Narrative: Met with pt for scheduled appointment. Person-centered approach used to continue therapeutic alliance. Pt provided updates since previous visit including continued interpersonal concerns and exhaustion. Provided support to explore feelings and desire for change. Normalized experience and identified roots in self-esteem. Discussed "homework" to journal about dreams for herself that would build purpose or feeling "enough" for next appointment. Scheduled follow up.   Effectiveness of the intervention(s) and the beneficiary's response or progress toward goal(s):  Patient is in a contemplative stage of change to engage in treatment plan.

## 2023-08-18 ENCOUNTER — Encounter: Payer: Self-pay | Admitting: Physician Assistant

## 2023-08-18 ENCOUNTER — Ambulatory Visit: Payer: Self-pay | Admitting: Physician Assistant

## 2023-08-18 ENCOUNTER — Ambulatory Visit: Payer: Self-pay

## 2023-08-18 VITALS — BP 96/59 | HR 70 | Temp 97.0°F | Ht 60.0 in | Wt 162.0 lb

## 2023-08-18 DIAGNOSIS — R208 Other disturbances of skin sensation: Secondary | ICD-10-CM

## 2023-08-18 DIAGNOSIS — Z3042 Encounter for surveillance of injectable contraceptive: Secondary | ICD-10-CM

## 2023-08-18 DIAGNOSIS — F32A Depression, unspecified: Secondary | ICD-10-CM

## 2023-08-18 LAB — POCT URINALYSIS DIPSTICK
Bilirubin, UA: NEGATIVE
Blood, UA: NEGATIVE
Glucose, UA: NEGATIVE
Ketones, UA: NEGATIVE
Nitrite, UA: NEGATIVE
Protein, UA: NEGATIVE
Spec Grav, UA: 1.015 (ref 1.010–1.025)
Urobilinogen, UA: 0.2 U/dL
pH, UA: 6.5 (ref 5.0–8.0)

## 2023-08-18 MED ORDER — FLUCONAZOLE 150 MG PO TABS: 150.0000 mg | ORAL_TABLET | Freq: Once | ORAL | 0 refills | Status: AC

## 2023-08-18 MED ORDER — MEDROXYPROGESTERONE ACETATE 150 MG/ML IM SUSP
150.0000 mg | Freq: Once | INTRAMUSCULAR | Status: AC
  Administered 2023-08-18: 150 mg via INTRAMUSCULAR

## 2023-08-18 NOTE — Progress Notes (Signed)
This is session #17 with Samantha Khan for individual behavioral health services for anxiety. Patient presents alone with child in the room. BHP spent 40 minutes with patient.    Patient reports anxious symptoms including racing thoughts, irritability, and feelings of overwhelm. Pt names depressive symptoms including increased crying, hopelessness, and low self-worth. Symptoms are related to environmental barriers and interpersonal stressors. Patient will likely benefit from individual services with strengths-based intervention and coping skill development..   This procedure has been fully reviewed with the patient and informed consent has been obtained.   Patient location: Pt seen in clinic via telehealth.   I connected with  [name] on 08/18/23 by a video enabled telemedicine application and verified that I am speaking with the correct person.   I discussed the limitations of evaluation and management by telemedicine. The patient expressed understanding and agreed to proceed.     ASSESSMENT AND PLAN   Current diagnosis:  anxiety and depression   We created the following treatment plan at today's appointment:               1)  08/18/23               2)  Decrease anxious and depressive symptoms through self awareness, coping skills, self-care, and supports, and increase overall level of life satisfaction and well-being.                3)  Medication management with Provider. On 08/12/22, pt shared that dose of sertraline was increased from 5mg  to 10mg , and felt that had been an improvement to her mood overall. On 12/09/2022, pt requested an increase with Provider. On 02/03/23 asked Provider about adjusting medication due to headaches. 06/23/23 pt requested increase in sertraline.               4)  Next screening due in 0 session.     HISTORY OF PRESENT ILLNESS   Mental Status: Destinae presented oriented X4. Mood and affect were anxious and depressed. Judgment was appropriate. Memory was appropriate.  Thought processes and content were appropriate. Patient denies SI/HI.     Narrative: Met with pt for scheduled appointment. Person-centered approach used to continue therapeutic alliance. Pt provided updates since previous visit including continued interpersonal concerns. Provided support to explore feelings and desire for change. Identified possibility of children contributing to the care of the home through chores to provide some relief. Discussed increase interpersonal tension and considered a conversation about expectations to help them be on the same page and strive for improved communication overall. Also challenged pt to look for small moments for herself with a focus on gratitude. Scheduled follow up.   Effectiveness of the intervention(s) and the beneficiary's response or progress toward goal(s):  Patient is in a contemplative stage of change to engage in treatment plan

## 2023-08-18 NOTE — Progress Notes (Signed)
BP (!) 96/59   Pulse 70   Temp (!) 97 F (36.1 C)   Ht 5' (1.524 m)   Wt 162 lb (73.5 kg)   SpO2 98%   BMI 31.64 kg/m    Subjective:    Patient ID: Samantha Khan, female    DOB: 05-09-1991, 33 y.o.   MRN: [id number]  HPI: Samantha Khan is a 33 y.o. female presenting on 08/18/2023 for Mental Health Problem and Contraception   HPI  Chief Complaint  Patient presents with   Mental Health Problem   Contraception   Vaginal Itching    Vaginal itching and white discharge for about 2 weeks . Pt states she used monistat 3 days and it helped with the burning, but is still itching.    She is still having some discharge.   She used the monistat about 2 wk ago.  Still with burning.    She has a lot of dryness with intercourse.    Pt is supported financially by her boyfriend with whom she lives.  Pt has three children.   Pt says she is doing well with the sertraline.  She is no longer having episodes of crying.  She says sometimes she does yell/argue with her boyfriend.  She denies SI, HI.    Relevant past medical, surgical, family and social history reviewed and updated as indicated. Interim medical history since our last visit reviewed. Allergies and medications reviewed and updated.   Current Outpatient Medications:    medroxyPROGESTERone (DEPO-PROVERA) 150 MG/ML injection, Inject 150 mg into the muscle every 3 (three) months., Disp: , Rfl:    Prenatal Vit-Fe Fumarate-FA (PRENATAL VITAMIN PO), Take by mouth., Disp: , Rfl:    sertraline (ZOLOFT) 100 MG tablet, Take 1 tablet (100 mg total) by mouth 2 (two) times daily., Disp: 60 tablet, Rfl: 1   meclizine (ANTIVERT) 25 MG tablet, Take 1 tablet (25 mg total) by mouth 3 (three) times daily as needed for dizziness. (Patient not taking: Reported on 08/18/2023), Disp: 30 tablet, Rfl: 0    Review of Systems  Per HPI unless specifically indicated above     Objective:    BP (!) 96/59   Pulse 70   Temp (!) 97 F (36.1 C)   Ht 5'  (1.524 m)   Wt 162 lb (73.5 kg)   SpO2 98%   BMI 31.64 kg/m   Wt Readings from Last 3 Encounters:  08/18/23 162 lb (73.5 kg)  06/23/23 163 lb 4 oz (74 kg)  03/03/23 176 lb (79.8 kg)    Physical Exam Constitutional:      General: She is not in acute distress.    Appearance: She is not toxic-appearing.  HENT:     Head: Normocephalic and atraumatic.  Pulmonary:     Effort: Pulmonary effort is normal. No respiratory distress.  Neurological:     Mental Status: She is alert and oriented to person, place, and time.  Psychiatric:        Attention and Perception: Attention normal.        Mood and Affect: Affect is not inappropriate.        Speech: Speech normal.        Behavior: Behavior normal. Behavior is cooperative.           Assessment & Plan:   Encounter Diagnoses  Name Primary?   Anxiety and depression Yes   Burning sensation    Encounter for management and injection of injectable progestin contraceptive  Anxiety/depression -continue sertraline -continue with [facility]  Contraception -depo-provera given today -next dose due 12 week  Vaginal burning and discharge -rx diflucan  -discussed with pt that if her symptoms persist, she will need STD testing.  Discussed that [facility] does not provide this service but she can get tested at [facility]  Pt to follow up 12 weeks.  She is to contact office sooner prn

## 2023-09-01 ENCOUNTER — Ambulatory Visit: Payer: Self-pay

## 2023-09-07 ENCOUNTER — Other Ambulatory Visit: Payer: Self-pay | Admitting: Physician Assistant

## 2023-09-22 ENCOUNTER — Ambulatory Visit: Payer: Self-pay

## 2023-09-29 ENCOUNTER — Ambulatory Visit: Payer: Self-pay

## 2023-09-29 DIAGNOSIS — F32A Depression, unspecified: Secondary | ICD-10-CM

## 2023-10-06 NOTE — Progress Notes (Signed)
 This is session #18 with Chi for individual behavioral health services for anxiety and depression. Patient presents alone with child in the room. BHP spent 30 minutes with patient.    Patient reports anxious symptoms including racing thoughts, irritability, and feelings of overwhelm. Pt names depressive symptoms including increased crying, hopelessness, and low self-worth. Symptoms are related to environmental barriers and interpersonal stressors. Patient will likely benefit from individual services with strengths-based intervention and coping skill development..   This procedure has been fully reviewed with the patient and informed consent has been obtained.   Patient location: Pt seen in clinic via telehealth.   I connected with  Samantha Khan on 09/29/23 by a video enabled telemedicine application and verified that I am speaking with the correct person.   I discussed the limitations of evaluation and management by telemedicine. The patient expressed understanding and agreed to proceed.     ASSESSMENT AND PLAN   Current diagnosis:  anxiety and depression   We created the following treatment plan at today's appointment:               1)  09/29/23               2)  Decrease anxious and depressive symptoms through self awareness, coping skills, self-care, and supports, and increase overall level of life satisfaction and well-being.                3)  Medication management with Provider. On 08/12/22, pt shared that dose of sertraline was increased from 5mg  to 10mg , and felt that had been an improvement to her mood overall. On 12/09/2022, pt requested an increase with Provider. On 02/03/23 asked Provider about adjusting medication due to headaches. 06/23/23 pt requested increase in sertraline.               4)  Next screening due in 0 session.     HISTORY OF PRESENT ILLNESS   Mental Status: Samantha Khan presented oriented X4. Mood and affect were anxious and depressed. Judgment was appropriate. Memory was  appropriate. Thought processes and content were appropriate. Patient denies SI/HI.     Narrative: Met with pt for scheduled appointment. Person-centered approach used to continue therapeutic alliance. Pt provided updates since previous visit including mother's birthday, poor sleep (3-4 hours only a night), and continued interpersonal concerns contributing to hopelessness. Provided support to explore feelings and desire for change. Reminded pt of supports available including the possibility for older children to help through chores to provide some relief. Noting (through MI) lack of change within interpersonal dynamic, offered reframe to shift focus to what lies within her control. Encouraged pt to notice what is helpful to her and what is valuable for her to focus on. Scheduled follow up.   Effectiveness of the intervention(s) and the beneficiary's response or progress toward goal(s):  Patient is in a contemplative stage of change to engage in treatment plan

## 2023-10-13 ENCOUNTER — Ambulatory Visit: Payer: Self-pay

## 2023-10-13 DIAGNOSIS — F419 Anxiety disorder, unspecified: Secondary | ICD-10-CM

## 2023-10-21 NOTE — Progress Notes (Signed)
 This is session #19 with Samantha Khan for individual behavioral health services for anxiety and depression. Patient presents alone. BHP spent 40 minutes with patient.    Patient reports anxious symptoms including racing thoughts, irritability, and feelings of overwhelm. Pt names depressive symptoms including increased crying, hopelessness, and low self-worth. Symptoms are related to environmental barriers and interpersonal stressors. Patient will likely benefit from individual services with strengths-based intervention and coping skill development..   This procedure has been fully reviewed with the patient and informed consent has been obtained.   Patient location: Pt seen in clinic via telehealth.   I connected with  Samantha Khan on 10/13/23 by a video enabled telemedicine application and verified that I am speaking with the correct person.   I discussed the limitations of evaluation and management by telemedicine. The patient expressed understanding and agreed to proceed.     ASSESSMENT AND PLAN   Current diagnosis:  anxiety and depression   We created the following treatment plan at today's appointment:               1)  10/13/23               2)  Decrease anxious and depressive symptoms through self awareness, coping skills, self-care, and supports, and increase overall level of life satisfaction and well-being.                3)  Medication management with Provider. On 08/12/22, pt shared that dose of sertraline was increased from 5mg  to 10mg , and felt that had been an improvement to her mood overall. On 12/09/2022, pt requested an increase with Provider. On 02/03/23 asked Provider about adjusting medication due to headaches. 06/23/23 pt requested increase in sertraline.               4)  Next screening due in 0 session.     HISTORY OF PRESENT ILLNESS   Mental Status: Samantha Khan presented oriented X4. Mood and affect were anxious and depressed. Judgment was appropriate. Memory was appropriate. Thought  processes and content were appropriate. Patient denies SI/HI.     Narrative: Met with pt for scheduled appointment. Person-centered approach used to continue therapeutic alliance. Pt provided updates since previous visit including continued poor sleep and interpersonal concerns contributing to hopelessness. Provided support to explore feelings and recent tensions. Psychoeducation provided around relationship dynamics and appropriate boundary setting. Assessed for additional concerns pt wanted to address which they denied. Scheduled follow up.   Effectiveness of the intervention(s) and the beneficiary's response or progress toward goal(s):  Patient is in a contemplative stage of change to engage in treatment plan

## 2023-10-27 ENCOUNTER — Ambulatory Visit: Payer: Self-pay

## 2023-11-03 ENCOUNTER — Ambulatory Visit: Payer: Self-pay

## 2023-11-10 ENCOUNTER — Ambulatory Visit: Payer: Self-pay

## 2023-11-10 ENCOUNTER — Ambulatory Visit: Payer: Self-pay | Admitting: Physician Assistant

## 2023-11-11 ENCOUNTER — Ambulatory Visit: Payer: Self-pay | Admitting: Physician Assistant

## 2023-11-11 ENCOUNTER — Encounter: Payer: Self-pay | Admitting: Physician Assistant

## 2023-11-11 VITALS — BP 110/63 | HR 79 | Temp 97.7°F | Ht 60.0 in | Wt 167.5 lb

## 2023-11-11 DIAGNOSIS — F419 Anxiety disorder, unspecified: Secondary | ICD-10-CM

## 2023-11-11 DIAGNOSIS — Z3042 Encounter for surveillance of injectable contraceptive: Secondary | ICD-10-CM

## 2023-11-11 MED ORDER — MEDROXYPROGESTERONE ACETATE 150 MG/ML IM SUSP
150.0000 mg | Freq: Once | INTRAMUSCULAR | Status: AC
  Administered 2023-11-11: 150 mg via INTRAMUSCULAR

## 2023-11-11 NOTE — Patient Instructions (Signed)
Preventive Care 40-34 Years Old, Female Preventive care refers to lifestyle choices and visits with your health care provider that can promote health and wellness. Preventive care visits are also called wellness exams. What can I expect for my preventive care visit? Counseling Your health care provider may ask you questions about your: Medical history, including: Past medical problems. Family medical history. Pregnancy history. Current health, including: Menstrual cycle. Method of birth control. Emotional well-being. Home life and relationship well-being. Sexual activity and sexual health. Lifestyle, including: Alcohol, nicotine or tobacco, and drug use. Access to firearms. Diet, exercise, and sleep habits. Work and work environment. Sunscreen use. Safety issues such as seatbelt and bike helmet use. Physical exam Your health care provider will check your: Height and weight. These may be used to calculate your BMI (body mass index). BMI is a measurement that tells if you are at a healthy weight. Waist circumference. This measures the distance around your waistline. This measurement also tells if you are at a healthy weight and may help predict your risk of certain diseases, such as type 2 diabetes and high blood pressure. Heart rate and blood pressure. Body temperature. Skin for abnormal spots. What immunizations do I need?  Vaccines are usually given at various ages, according to a schedule. Your health care provider will recommend vaccines for you based on your age, medical history, and lifestyle or other factors, such as travel or where you work. What tests do I need? Screening Your health care provider may recommend screening tests for certain conditions. This may include: Lipid and cholesterol levels. Diabetes screening. This is done by checking your blood sugar (glucose) after you have not eaten for a while (fasting). Pelvic exam and Pap test. Hepatitis B test. Hepatitis C  test. HIV (human immunodeficiency virus) test. STI (sexually transmitted infection) testing, if you are at risk. Lung cancer screening. Colorectal cancer screening. Mammogram. Talk with your health care provider about when you should start having regular mammograms. This may depend on whether you have a family history of breast cancer. BRCA-related cancer screening. This may be done if you have a family history of breast, ovarian, tubal, or peritoneal cancers. Bone density scan. This is done to screen for osteoporosis. Talk with your health care provider about your test results, treatment options, and if necessary, the need for more tests. Follow these instructions at home: Eating and drinking  Eat a diet that includes fresh fruits and vegetables, whole grains, lean protein, and low-fat dairy products. Take vitamin and mineral supplements as recommended by your health care provider. Do not drink alcohol if: Your health care provider tells you not to drink. You are pregnant, may be pregnant, or are planning to become pregnant. If you drink alcohol: Limit how much you have to 0-1 drink a day. Know how much alcohol is in your drink. In the [place] one drink equals one 12 oz bottle of beer (355 mL), one 5 oz glass of wine (148 mL), or one 1 oz glass of hard liquor (44 mL). Lifestyle Brush your teeth every morning and night with fluoride toothpaste. Floss one time each day. Exercise for at least 30 minutes 5 or more days each week. Do not use any products that contain nicotine or tobacco. These products include cigarettes, chewing tobacco, and vaping devices, such as e-cigarettes. If you need help quitting, ask your health care provider. Do not use drugs. If you are sexually active, practice safe sex. Use a condom or other form of protection to   prevent STIs. If you do not wish to become pregnant, use a form of birth control. If you plan to become pregnant, see your health care provider for a  prepregnancy visit. Take aspirin only as told by your health care provider. Make sure that you understand how much to take and what form to take. Work with your health care provider to find out whether it is safe and beneficial for you to take aspirin daily. Find healthy ways to manage stress, such as: Meditation, yoga, or listening to music. Journaling. Talking to a trusted person. Spending time with friends and family. Minimize exposure to UV radiation to reduce your risk of skin cancer. Safety Always wear your seat belt while driving or riding in a vehicle. Do not drive: If you have been drinking alcohol. Do not ride with someone who has been drinking. When you are tired or distracted. While texting. If you have been using any mind-altering substances or drugs. Wear a helmet and other protective equipment during sports activities. If you have firearms in your house, make sure you follow all gun safety procedures. Seek help if you have been physically or sexually abused. What's next? Visit your health care provider once a year for an annual wellness visit. Ask your health care provider how often you should have your eyes and teeth checked. Stay up to date on all vaccines. This information is not intended to replace advice given to you by your health care provider. Make sure you discuss any questions you have with your health care provider. Document Revised: 12/19/2020 Document Reviewed: 12/19/2020 Elsevier Patient Education  2024 [employer].

## 2023-11-11 NOTE — Progress Notes (Signed)
 BP 110/63   Pulse 79   Temp 97.7 F (36.5 C)   Ht 5' (1.524 m)   Wt 167 lb 8 oz (76 kg)   SpO2 98%   BMI 32.71 kg/m    Subjective:    Patient ID: Samantha Khan, female    DOB: May 13, 1991, 33 y.o.   MRN: [id number]  HPI: Samantha Khan is a 33 y.o. female presenting on 11/11/2023 for Mental Health Problem, Contraception (Pt states she has been doing good on the depo, but would like to discuss other options as she has heard that it could harm her bones causing osteoporosis. Other than that she likes the depo.), and Dizziness (Pt states she has been dizzy for over a week. Pt states she gets dizzy when she is standing or changing positions. Pt also c/o HA all over head. Pt states she has had very light sob like she has been tried or overexerted. )   HPI  Chief Complaint  Patient presents with   Mental Health Problem   Contraception    Pt states she has been doing good on the depo, but would like to discuss other options as she has heard that it could harm her bones causing osteoporosis. Other than that she likes the depo.   Dizziness    Pt states she has been dizzy for over a week. Pt states she gets dizzy when she is standing or changing positions. Pt also c/o HA all over head. Pt states she has had very light sob like she has been tried or overexerted.      Pt says her mood has been stable.  She still has a lot of stressors but says the medication and her appointments with [facility] are very helpful.  She has no SI, HI.    Pt admits that she has not drank much today, maybe 2 cups.  She says she sometimes doesn't drink much because she sometimes has trouble holding urine when she gets the urge.    Relevant past medical, surgical, family and social history reviewed and updated as indicated. Interim medical history since our last visit reviewed. Allergies and medications reviewed and updated.   Current Outpatient Medications:    meclizine  (ANTIVERT ) 25 MG tablet, Take 1 tablet (25 mg  total) by mouth 3 (three) times daily as needed for dizziness., Disp: 30 tablet, Rfl: 0   medroxyPROGESTERone  (DEPO-PROVERA ) 150 MG/ML injection, Inject 150 mg into the muscle every 3 (three) months., Disp: , Rfl:    Prenatal Vit-Fe Fumarate-FA (PRENATAL VITAMIN PO), Take by mouth., Disp: , Rfl:    sertraline  (ZOLOFT ) 100 MG tablet, Take 1 tablet by mouth twice daily, Disp: 60 tablet, Rfl: 3    Review of Systems  Per HPI unless specifically indicated above     Objective:    BP 110/63   Pulse 79   Temp 97.7 F (36.5 C)   Ht 5' (1.524 m)   Wt 167 lb 8 oz (76 kg)   SpO2 98%   BMI 32.71 kg/m   Wt Readings from Last 3 Encounters:  11/11/23 167 lb 8 oz (76 kg)  08/18/23 162 lb (73.5 kg)  06/23/23 163 lb 4 oz (74 kg)    Physical Exam Vitals reviewed.  Constitutional:      General: She is not in acute distress.    Appearance: She is well-developed. She is not toxic-appearing.  HENT:     Head: Normocephalic and atraumatic.  Cardiovascular:     Rate and Rhythm:  Normal rate and regular rhythm.  Pulmonary:     Effort: Pulmonary effort is normal.     Breath sounds: Normal breath sounds.  Abdominal:     General: Bowel sounds are normal.     Palpations: Abdomen is soft. There is no mass.     Tenderness: There is no abdominal tenderness.  Musculoskeletal:     Cervical back: Neck supple.     Right lower leg: No edema.     Left lower leg: No edema.  Lymphadenopathy:     Cervical: No cervical adenopathy.  Skin:    General: Skin is warm and dry.  Neurological:     Mental Status: She is alert and oriented to person, place, and time.  Psychiatric:        Attention and Perception: Attention normal.        Mood and Affect: Affect is not inappropriate.        Speech: Speech normal.        Behavior: Behavior normal. Behavior is cooperative.            Assessment & Plan:    Encounter Diagnoses  Name Primary?   Anxiety and depression Yes   Encounter for management and  injection of injectable progestin contraceptive      -Bp improved when pt drank a bottle of water.  She is counseled to hydrate to avoid dehydration and hypotension.  This should help her dizziness too.  If it persists, she should RTO -discussed contraceptive options including OCP and IUD.  She wants to continue with depo for now -pt to continue sertraline  and [facility] -pt counseled on kegel exercises and was given handout -f/u 12 wk

## 2023-11-24 ENCOUNTER — Ambulatory Visit: Payer: Self-pay

## 2023-11-24 DIAGNOSIS — F419 Anxiety disorder, unspecified: Secondary | ICD-10-CM

## 2023-11-25 ENCOUNTER — Ambulatory Visit: Payer: Self-pay

## 2023-12-01 NOTE — Progress Notes (Signed)
 This is session #20 with Samantha Khan for individual behavioral health services for anxiety and depression. Patient presents alone. BHP spent 45 minutes with patient.    Patient reports anxious symptoms including racing thoughts, irritability, and feelings of overwhelm. Pt names depressive symptoms including increased crying, hopelessness, and low self-worth. Symptoms are related to environmental barriers and interpersonal stressors. Patient will likely benefit from individual services with strengths-based intervention and coping skill development..   This procedure has been fully reviewed with the patient and informed consent has been obtained.   Patient location: Pt seen in clinic via telehealth.   I connected with  Samantha Khan on 11/24/23 by a video enabled telemedicine application and verified that I am speaking with the correct person.   I discussed the limitations of evaluation and management by telemedicine. The patient expressed understanding and agreed to proceed.     ASSESSMENT AND PLAN   Current diagnosis:  anxiety and depression   We created the following treatment plan at today's appointment:               1)  11/24/23               2)  Decrease anxious and depressive symptoms through self awareness, coping skills, self-care, and supports, and increase overall level of life satisfaction and well-being.                3)  Medication management with Provider. On 08/12/22, pt shared that dose of sertraline  was increased from 5mg  to 10mg , and felt that had been an improvement to her mood overall. On 12/09/2022, pt requested an increase with Provider. On 02/03/23 asked Provider about adjusting medication due to headaches. 06/23/23 pt requested increase in sertraline .               4)  Next screening due in 0 session.     HISTORY OF PRESENT ILLNESS   Mental Status: Samantha Khan presented oriented X4. Mood and affect were anxious and depressed. Judgment was appropriate. Memory was appropriate. Thought  processes and content were appropriate. Patient denies SI/HI.     Narrative: Met with pt for scheduled appointment. Person-centered approach used to continue therapeutic alliance. Pt provided updates since previous visit including continued poor sleep but felt improvement around interpersonal concerns attributed to space. Provided support to explore feelings and cognitions. Identified anger as a barrier to effective communication. Roleplayed communication strategies. Pt named enjoying sessions to vent and feel understood. Assessed for additional concerns pt wanted to address which they denied. Scheduled follow up.   Effectiveness of the intervention(s) and the beneficiary's response or progress toward goal(s):  Patient is in a contemplative stage of change to engage in treatment plan

## 2023-12-15 ENCOUNTER — Ambulatory Visit: Payer: Self-pay

## 2023-12-22 ENCOUNTER — Ambulatory Visit: Payer: Self-pay

## 2023-12-29 ENCOUNTER — Ambulatory Visit: Payer: Self-pay

## 2023-12-29 ENCOUNTER — Encounter: Payer: Self-pay | Admitting: Physician Assistant

## 2023-12-29 ENCOUNTER — Ambulatory Visit: Payer: Self-pay | Admitting: Physician Assistant

## 2023-12-29 VITALS — HR 80 | Temp 98.0°F

## 2023-12-29 DIAGNOSIS — B353 Tinea pedis: Secondary | ICD-10-CM

## 2023-12-29 DIAGNOSIS — F419 Anxiety disorder, unspecified: Secondary | ICD-10-CM

## 2023-12-29 MED ORDER — CLOTRIMAZOLE-BETAMETHASONE 1-0.05 % EX CREA
1.0000 | TOPICAL_CREAM | Freq: Every day | CUTANEOUS | Status: DC
Start: 2023-12-29 — End: 2024-02-02

## 2023-12-29 NOTE — Patient Instructions (Signed)
 Pie de atleta Athlete's Foot El pie de atleta (tinea pedis) es una infeccin por hongos en la piel de los pies. Generalmente se produce en la piel que se encuentra entre los dedos o debajo de ellos. Tambin puede aparecer en la planta de los pies. La infeccin puede transmitirse de ignacia persona a otra (es contagiosa). Tambin puede propagarse cuando los pies descalzos de una persona entran en contacto con el hongo que est en el piso de la ducha o sobre artculos como zapatos. Cules son las causas? Esta afeccin es causada por un hongo que crece en lugares clidos y hmedos. Puede contagiarse el pie de atleta al compartir zapatos, duchas, toallas y pisos mojados con una persona infectada. La falta de higiene en los pies o no cambiarse los calcetines con frecuencia puede ocasionar pie de atleta. Qu incrementa el riesgo? Es ms probable que esta afeccin se manifieste en: Hombres. Las personas cuyo sistema de defensa del organismo (sistema inmunitario) es dbil. Las personas con diabetes. Las personas que usan duchas pblicas, por ejemplo, en un gimnasio. Las personas que usan zapatos reforzados, [employer] de uso industrial o Civil engineer, contracting. Las [employer] que el clima es clido y hmedo. Cules son los signos o sntomas? Los sntomas de esta afeccin incluyen: Zonas que pican entre los dedos o en las plantas de los pies. Zonas blancas speras o escamosas entre los dedos o en las plantas de los pies. Ampollas muy pequeas que pican mucho entre los dedos o en las plantas de los pies. Pequeos cortes en la piel. Estos cortes pueden infectarse. Uas de los pies engrosadas o pigmentadas. Cmo se diagnostica? Esta afeccin puede diagnosticarse con un examen fsico y ignacia revisin de sus antecedentes mdicos. El mdico tambin puede obtener [place] de piel o de una ua del pie para examinarla bajo microscopio. Cmo se trata? Esta afeccin se trata con antimicticos. Estos medicamentos se  pueden aplicar como polvos, ungentos o cremas. En los casos graves, se pueden Building services engineer antimicticos por va oral. Siga estas instrucciones en su casa: Medicamentos Aplique o tome los medicamentos de venta libre y los recetados solamente como se lo haya indicado el mdico. Aplique el medicamento antimictico como se lo haya indicado el mdico. No deje de usar el antimictico, aunque su afeccin mejore. Cuidados de los pies No se rasque los pies. Mantenga los pies secos: Use calcetines de algodn o lana. Cmbiese los calcetines [employer] o si se mojan. Use un tipo de calzado que permita que el aire [place], como sandalias o zapatillas de lona. Lvese y [employer], incluida el rea entre los dedos de los pies. Tambin, lvese y squese los pies: [employer] o como se lo haya indicado el mdico. Despus de hacer actividad fsica. Instrucciones generales No comparta con otras personas toallas, alicates para las uas u otros objetos de uso personal que tengan contacto con los pies. Use sandalias cuando tenga que pisar zonas hmedas, como vestuarios y duchas compartidas. Concurra a todas las visitas de seguimiento. Esto es importante. Si tiene diabetes, mantenga bajo control el nivel de Banker. Comunquese con un mdico si: Tiene fiebre. Aumenta la hinchazn, el dolor, el calor o el enrojecimiento en el pie. Los pies no mejoran con Scientist, research (medical). Los sntomas empeoran. Aparecen nuevos sntomas. Siente dolor intenso. Resumen El pie de atleta (tinea pedis) es una infeccin por hongos en la piel de los pies. Generalmente se produce en la piel que se encuentra entre  los dedos o debajo de ellos. Esta afeccin es causada por un hongo que crece en lugares clidos y hmedos. Los sntomas incluyen zonas blancas speras o escamosas entre los dedos o en las plantas de los pies. Esta afeccin se trata con antimicticos. Mantenga los pies limpios. Siempre squelos  cuidadosamente. Esta informacin no tiene Theme park manager el consejo del mdico. Asegrese de hacerle al mdico cualquier pregunta que tenga. Document Revised: 11/01/2020 Document Reviewed: 11/01/2020 Elsevier Patient Education  2024 [employer].

## 2023-12-29 NOTE — Progress Notes (Signed)
   Pulse 80   Temp 98 F (36.7 C)   SpO2 97%    Subjective:    Patient ID: Samantha Khan, female    DOB: Mar 10, 1991, 33 y.o.   MRN: [id number]  HPI: Samantha Khan is a 34 y.o. female presenting on 12/29/2023 for No chief complaint on file.   HPI  Problems with her feet started about a month ago.  She has tried Lamisil,  lotrimin, all kinds of OTC creams, stuff from the  [place] store.     Relevant past medical, surgical, family and social history reviewed and updated as indicated. Interim medical history since our last visit reviewed. Allergies and medications reviewed and updated.   Current Outpatient Medications:    sertraline  (ZOLOFT ) 100 MG tablet, Take 1 tablet by mouth twice daily, Disp: 60 tablet, Rfl: 3   meclizine  (ANTIVERT ) 25 MG tablet, Take 1 tablet (25 mg total) by mouth 3 (three) times daily as needed for dizziness., Disp: 30 tablet, Rfl: 0   medroxyPROGESTERone  (DEPO-PROVERA ) 150 MG/ML injection, Inject 150 mg into the muscle every 3 (three) months., Disp: , Rfl:    Prenatal Vit-Fe Fumarate-FA (PRENATAL VITAMIN PO), Take by mouth., Disp: , Rfl:    Review of Systems  Per HPI unless specifically indicated above     Objective:    Pulse 80   Temp 98 F (36.7 C)   SpO2 97%   Wt Readings from Last 3 Encounters:  11/11/23 167 lb 8 oz (76 kg)  08/18/23 162 lb (73.5 kg)  06/23/23 163 lb 4 oz (74 kg)    Physical Exam Constitutional:      General: She is not in acute distress.    Appearance: She is not toxic-appearing.  HENT:     Head: Atraumatic.  Pulmonary:     Effort: Pulmonary effort is normal. No respiratory distress.  Feet:     Right foot:     Skin integrity: No ulcer.     Left foot:     Skin integrity: No ulcer.     Comments: Dry peeling skin plantar surface bilateral feet.  No maceration webspaces. No secondary infection.   Neurological:     Mental Status: She is alert and oriented to person, place, and time.   Psychiatric:        Behavior:  Behavior normal.            Assessment & Plan:    Encounter Diagnosis  Name Primary?   Tinea pedis of both feet Yes     -pt was counseled on housekeeping and hygiene with tinea,  she was given reading information on it as well -pt was given sample lotrisone and counseled on use -she has follow up appointment next month.  She is to contact office sooner prn

## 2024-01-11 NOTE — Progress Notes (Signed)
 This is session #21 with Samantha Khan for individual behavioral health services for anxiety and depression. Patient presents alone. BHP spent 45 minutes with patient.    Patient reports anxious symptoms including racing thoughts, irritability, and feelings of overwhelm. Pt names depressive symptoms including increased crying, hopelessness, and low self-worth. Symptoms are related to environmental barriers and interpersonal stressors. Patient will likely benefit from individual services with strengths-based intervention and coping skill development..   This procedure has been fully reviewed with the patient and informed consent has been obtained.   Patient location: Pt seen in clinic via telehealth.   I connected with  Samantha Khan on 12/29/23 by a video enabled telemedicine application and verified that I am speaking with the correct person.   I discussed the limitations of evaluation and management by telemedicine. The patient expressed understanding and agreed to proceed.     ASSESSMENT AND PLAN   Current diagnosis:  anxiety and depression   We created the following treatment plan at today's appointment:               1)  12/29/23               2)  Decrease anxious and depressive symptoms through self awareness, coping skills, self-care, and supports, and increase overall level of life satisfaction and well-being.                3)  Medication management with Provider. On 08/12/22, pt shared that dose of sertraline  was increased from 5mg  to 10mg , and felt that had been an improvement to her mood overall. On 12/09/2022, pt requested an increase with Provider. On 02/03/23 asked Provider about adjusting medication due to headaches. 06/23/23 pt requested increase in sertraline .               4)  Next screening due in 0 session.     HISTORY OF PRESENT ILLNESS   Mental Status: [place] presented oriented X4. Mood and affect were anxious and depressed. Judgment was appropriate. Memory was appropriate. Thought  processes and content were appropriate. Patient denies SI/HI.     Narrative: Met with pt for scheduled appointment. Person-centered approach used to continue therapeutic alliance. Pt provided updates since previous visit including continued felt improvement around interpersonal concerns but increased anger. Provided support to explore feelings and cognitions. Pt names desire to continue sessions to vent and feel understood, introduced spacing out visits. Assessed for additional concerns pt wanted to address which they denied. Scheduled follow up.   Effectiveness of the intervention(s) and the beneficiary's response or progress toward goal(s):  Patient is in a contemplative stage of change to engage in treatment plan

## 2024-01-14 ENCOUNTER — Other Ambulatory Visit: Payer: Self-pay | Admitting: Physician Assistant

## 2024-01-19 ENCOUNTER — Ambulatory Visit: Payer: Self-pay

## 2024-01-26 ENCOUNTER — Ambulatory Visit: Payer: Self-pay

## 2024-01-26 DIAGNOSIS — F32A Depression, unspecified: Secondary | ICD-10-CM

## 2024-02-02 ENCOUNTER — Ambulatory Visit: Payer: Self-pay

## 2024-02-02 ENCOUNTER — Encounter: Payer: Self-pay | Admitting: Physician Assistant

## 2024-02-02 ENCOUNTER — Ambulatory Visit: Payer: Self-pay | Admitting: Physician Assistant

## 2024-02-02 VITALS — BP 98/68 | HR 68 | Temp 98.0°F | Ht 60.0 in | Wt 168.5 lb

## 2024-02-02 DIAGNOSIS — B353 Tinea pedis: Secondary | ICD-10-CM

## 2024-02-02 DIAGNOSIS — F419 Anxiety disorder, unspecified: Secondary | ICD-10-CM

## 2024-02-02 DIAGNOSIS — Z3042 Encounter for surveillance of injectable contraceptive: Secondary | ICD-10-CM

## 2024-02-02 MED ORDER — SERTRALINE HCL 100 MG PO TABS: 100.0000 mg | ORAL_TABLET | Freq: Two times a day (BID) | ORAL | 3 refills | Status: DC

## 2024-02-02 MED ORDER — MEDROXYPROGESTERONE ACETATE 150 MG/ML IM SUSP
150.0000 mg | Freq: Once | INTRAMUSCULAR | Status: AC
  Administered 2024-02-02: 150 mg via INTRAMUSCULAR

## 2024-02-02 MED ORDER — CLOTRIMAZOLE-BETAMETHASONE 1-0.05 % EX CREA: 1.0000 | TOPICAL_CREAM | Freq: Every day | CUTANEOUS | Status: DC

## 2024-02-02 NOTE — Progress Notes (Signed)
 BP 98/68   Pulse 68   Temp 98 F (36.7 C)   Ht 5' (1.524 m)   Wt 168 lb 8 oz (76.4 kg)   SpO2 96%   BMI 32.91 kg/m    Subjective:    Patient ID: Samantha Khan, female    DOB: January 08, 1991, 33 y.o.   MRN: [id number]  HPI: Samantha Khan is a 33 y.o. female presenting on 02/02/2024 for Contraception (Depo - pt states only one mensus lasting a week within the last 3 months, otherwise everything is well. ) and Mental Health Problem   HPI  Chief Complaint  Patient presents with   Contraception    Depo - pt states only one mensus lasting a week within the last 3 months, otherwise everything is well.    Mental Health Problem    Pt says she is doing well.  She still has a lot of stress and depression but the medication helps and her appointments with [facility] also help.  Her son is getting lots of education and therapy for obesity which is helping the pt to learn a lot and is helping motivate her for exercise.   Relevant past medical, surgical, family and social history reviewed and updated as indicated. Interim medical history since our last visit reviewed. Allergies and medications reviewed and updated.   Current Outpatient Medications:    meclizine  (ANTIVERT ) 25 MG tablet, Take 1 tablet (25 mg total) by mouth 3 (three) times daily as needed for dizziness., Disp: 30 tablet, Rfl: 0   medroxyPROGESTERone  (DEPO-PROVERA ) 150 MG/ML injection, Inject 150 mg into the muscle every 3 (three) months., Disp: , Rfl:    Prenatal Vit-Fe Fumarate-FA (PRENATAL VITAMIN PO), Take by mouth., Disp: , Rfl:    sertraline  (ZOLOFT ) 100 MG tablet, Take 1 tablet by mouth twice daily, Disp: 60 tablet, Rfl: 0   clotrimazole -betamethasone  (LOTRISONE ) cream, Apply 1 Application topically daily. (Patient not taking: Reported on 02/02/2024), Disp: , Rfl:   Review of Systems  Per HPI unless specifically indicated above     Objective:    BP 98/68   Pulse 68   Temp 98 F (36.7 C)   Ht 5' (1.524 m)   Wt 168  lb 8 oz (76.4 kg)   SpO2 96%   BMI 32.91 kg/m   Wt Readings from Last 3 Encounters:  02/02/24 168 lb 8 oz (76.4 kg)  11/11/23 167 lb 8 oz (76 kg)  08/18/23 162 lb (73.5 kg)    Physical Exam Vitals reviewed.  Constitutional:      General: She is not in acute distress.    Appearance: She is well-developed. She is obese. She is not toxic-appearing.  HENT:     Head: Normocephalic and atraumatic.  Cardiovascular:     Rate and Rhythm: Normal rate and regular rhythm.  Pulmonary:     Effort: Pulmonary effort is normal.     Breath sounds: Normal breath sounds.  Abdominal:     General: Bowel sounds are normal.     Palpations: Abdomen is soft. There is no mass.     Tenderness: There is no abdominal tenderness.  Musculoskeletal:     Cervical back: Neck supple.     Right lower leg: No edema.     Left lower leg: No edema.  Lymphadenopathy:     Cervical: No cervical adenopathy.  Skin:    General: Skin is warm and dry.  Neurological:     Mental Status: She is alert and oriented to person, place,  and time.  Psychiatric:        Attention and Perception: Attention normal.        Mood and Affect: Affect is not inappropriate.        Speech: Speech normal.        Behavior: Behavior normal. Behavior is cooperative.           Assessment & Plan:    Encounter Diagnoses  Name Primary?   Anxiety and depression Yes   Encounter for management and injection of injectable progestin contraceptive    Tinea pedis of both feet      -Depo-provera  given by nurse -sertraline  rx renewed -pt to continue with [facility] -pt to follow up 12 weeks.  She is to contact office sooner prn

## 2024-02-05 NOTE — Progress Notes (Signed)
 This is session #22 with [facility] for individual behavioral health services for anxiety and depression. Patient presents alone. BHP spent 45 minutes with patient.    Patient reports anxious symptoms including racing thoughts, irritability, and feelings of overwhelm. Pt names depressive symptoms including increased crying, hopelessness, and low self-worth. Symptoms are related to environmental barriers and interpersonal stressors. Patient will likely benefit from individual services with strengths-based intervention and coping skill development..    This procedure has been fully reviewed with the patient and informed consent has been obtained.   Patient location: Pt seen in clinic via telehealth.   I connected with  Taji on 01/26/24 by a video enabled telemedicine application and verified that I am speaking with the correct person.   I discussed the limitations of evaluation and management by telemedicine. The patient expressed understanding and agreed to proceed.     ASSESSMENT AND PLAN   Current diagnosis:  anxiety and depression   We created the following treatment plan at today's appointment:               1)  01/26/24               2)  Decrease anxious and depressive symptoms through self awareness, coping skills, self-care, and supports, and increase overall level of life satisfaction and well-being.                3)  Medication management with Provider. On 08/12/22, pt shared that dose of sertraline  was increased from 5mg  to 10mg , and felt that had been an improvement to her mood overall. On 12/09/2022, pt requested an increase with Provider. On 02/03/23 asked Provider about adjusting medication due to headaches. 06/23/23 pt requested increase in sertraline .               4)  Next screening due in 0 session.     HISTORY OF PRESENT ILLNESS   Mental Status: Scarlettrose presented oriented X4. Mood and affect were anxious and depressed. Judgment was appropriate. Memory was appropriate. Thought  processes and content were appropriate. Patient denies SI/HI.     Narrative: Met with pt for scheduled appointment. Person-centered approach used to continue therapeutic alliance. Pt provided updates since previous visit including continued felt improvement overall but with continued poor sleep due to youngest child. Provided support to explore feelings and cognitions around interpersonal themes. Assessed for additional concerns pt wanted to address which they denied. Scheduled follow up.   Effectiveness of the intervention(s) and the beneficiary's response or progress toward goal(s):  Patient is in a contemplative stage of change to engage in treatment plan

## 2024-02-09 NOTE — Progress Notes (Signed)
 This is session #23 with Samantha Khan for individual behavioral health services for anxiety and depression. Patient presents alone. BHP spent 30 minutes with patient.    Patient reports anxious symptoms including racing thoughts, irritability, and feelings of overwhelm. Pt names depressive symptoms including increased crying, hopelessness, and low self-worth. Symptoms are related to environmental barriers and interpersonal stressors. Patient will likely benefit from individual services with strengths-based intervention and coping skill development..    This procedure has been fully reviewed with the patient and informed consent has been obtained.   Patient location: Pt seen in clinic via telehealth.   I connected with  Samantha Khan on 02/02/24 by a video enabled telemedicine application and verified that I am speaking with the correct person.   I discussed the limitations of evaluation and management by telemedicine. The patient expressed understanding and agreed to proceed.     ASSESSMENT AND PLAN   Current diagnosis:  anxiety and depression   We created the following treatment plan at today's appointment:               1)  02/02/24               2)  Decrease anxious and depressive symptoms through self awareness, coping skills, self-care, and supports, and increase overall level of life satisfaction and well-being.                3)  Medication management with Provider. On 08/12/22, pt shared that dose of sertraline  was increased from 5mg  to 10mg , and felt that had been an improvement to her mood overall. On 12/09/2022, pt requested an increase with Provider. On 02/03/23 asked Provider about adjusting medication due to headaches. 06/23/23 pt requested increase in sertraline .               4)  Next screening due in 0 session.     HISTORY OF PRESENT ILLNESS   Mental Status: Samantha Khan presented oriented X4. Mood and affect were anxious and depressed. Judgment was appropriate. Memory was appropriate. Thought  processes and content were appropriate. Patient denies SI/HI.     Narrative: Met with pt for scheduled appointment. Person-centered approach used to continue therapeutic alliance. Pt provided updates since previous visit including increased irritability. Provided support to explore concerns, noting feelings of anger and sadness stemming from intepersonal patterns of disappointment, loneliness, and comparison. Challenged pt to shift perspective through looking for any good things and voicing those outloud. Assessed for additional concerns pt wanted to address which they denied. Scheduled follow up.   Effectiveness of the intervention(s) and the beneficiary's response or progress toward goal(s):  Patient is in a contemplative stage of change to engage in treatment plan

## 2024-02-23 ENCOUNTER — Ambulatory Visit: Payer: Self-pay

## 2024-03-01 ENCOUNTER — Ambulatory Visit: Payer: Self-pay

## 2024-03-08 ENCOUNTER — Ambulatory Visit: Payer: Self-pay

## 2024-03-08 DIAGNOSIS — F419 Anxiety disorder, unspecified: Secondary | ICD-10-CM

## 2024-03-22 ENCOUNTER — Ambulatory Visit: Payer: Self-pay

## 2024-03-22 NOTE — Progress Notes (Signed)
 This is session #24 with Samantha Khan for individual behavioral health services for anxiety and depression. Patient presents alone. BHP spent 30 minutes with patient.    Patient reports anxious symptoms including racing thoughts, irritability, and feelings of overwhelm. Pt names depressive symptoms including increased crying, hopelessness, and low self-worth. Symptoms are related to environmental barriers and interpersonal stressors. Patient will likely benefit from individual services with strengths-based intervention and coping skill development..    This procedure has been fully reviewed with the patient and informed consent has been obtained.   Patient location: Pt seen in clinic via telehealth.   I connected with  Samantha Khan on 03/08/24 by a video enabled telemedicine application and verified that I am speaking with the correct person.   I discussed the limitations of evaluation and management by telemedicine. The patient expressed understanding and agreed to proceed.     ASSESSMENT AND PLAN   Current diagnosis:  anxiety and depression   We created the following treatment plan at today's appointment:               1)  03/08/24               2)  Decrease anxious and depressive symptoms through self awareness, coping skills, self-care, and supports, and increase overall level of life satisfaction and well-being.                3)  Medication management with Provider. On 08/12/22, pt shared that dose of sertraline  was increased from 5mg  to 10mg , and felt that had been an improvement to her mood overall. On 12/09/2022, pt requested an increase with Provider. On 02/03/23 asked Provider about adjusting medication due to headaches. 06/23/23 pt requested increase in sertraline .               4)  Next screening due in 0 session.     HISTORY OF PRESENT ILLNESS   Mental Status: Samantha Khan presented oriented X4. Mood and affect were anxious and depressed. Judgment was appropriate. Memory was appropriate. Thought  processes and content were appropriate. Patient denies SI/HI.     Narrative: Met with pt for scheduled appointment. Person-centered approach used to continue therapeutic alliance. 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 Provided support to explore concerns, noting emotions, and change talk. Challenged pt to communicate clearly through I statements. Assessed for additional concerns pt wanted to address which they denied. Scheduled follow up.   Effectiveness of the intervention(s) and the beneficiary's response or progress toward goal(s):  Patient is in a contemplative stage of change to engage in treatment plan

## 2024-04-05 ENCOUNTER — Ambulatory Visit: Payer: Self-pay

## 2024-04-26 ENCOUNTER — Ambulatory Visit: Payer: Self-pay

## 2024-04-26 ENCOUNTER — Ambulatory Visit: Payer: Self-pay | Admitting: Physician Assistant

## 2024-04-27 ENCOUNTER — Ambulatory Visit: Payer: Self-pay | Admitting: Physician Assistant

## 2024-04-27 ENCOUNTER — Encounter: Payer: Self-pay | Admitting: Physician Assistant

## 2024-04-27 VITALS — BP 93/53 | HR 70 | Temp 97.1°F | Ht 60.0 in | Wt 172.0 lb

## 2024-04-27 DIAGNOSIS — B353 Tinea pedis: Secondary | ICD-10-CM

## 2024-04-27 DIAGNOSIS — G5602 Carpal tunnel syndrome, left upper limb: Secondary | ICD-10-CM

## 2024-04-27 DIAGNOSIS — Z3042 Encounter for surveillance of injectable contraceptive: Secondary | ICD-10-CM

## 2024-04-27 DIAGNOSIS — F419 Anxiety disorder, unspecified: Secondary | ICD-10-CM

## 2024-04-27 MED ORDER — MEDROXYPROGESTERONE ACETATE 150 MG/ML IM SUSP
150.0000 mg | Freq: Once | INTRAMUSCULAR | Status: AC
  Administered 2024-04-27: 150 mg via INTRAMUSCULAR

## 2024-04-27 NOTE — Patient Instructions (Addendum)
 Samantha Khan

## 2024-04-27 NOTE — Progress Notes (Signed)
 BP (!) 93/53   Pulse 70   Temp (!) 97.1 F (36.2 C)   Ht 5' (1.524 m)   SpO2 96%   BMI 32.91 kg/m    Subjective:    Patient ID: Samantha Khan, female    DOB: Jan 31, 1991, 33 y.o.   MRN: [id number]  HPI: Samantha Khan is a 33 y.o. female presenting on 04/27/2024 for Contraception, Anxiety, and Tinea Pedis (Pt states she would like refill for Lotrisone . )   HPI  Chief Complaint  Patient presents with   Contraception   Anxiety   Tinea Pedis    Pt states she would like refill for Lotrisone .     Pt says her anxiety and depression continues to be helped by the sertraline  and counseling sessions.  She denies unprovoked crying spells, SI, HI.    She says she is sometimes having pain and tingling LUE in the thumb, index and long fingers.  She has history of carpal tunnel.    Relevant past medical, surgical, family and social history reviewed and updated as indicated. Interim medical history since our last visit reviewed. Allergies and medications reviewed and updated.    Current Outpatient Medications:    medroxyPROGESTERone  (DEPO-PROVERA ) 150 MG/ML injection, Inject 150 mg into the muscle every 3 (three) months., Disp: , Rfl:    Prenatal Vit-Fe Fumarate-FA (PRENATAL VITAMIN PO), Take by mouth., Disp: , Rfl:    sertraline  (ZOLOFT ) 100 MG tablet, Take 1 tablet (100 mg total) by mouth 2 (two) times daily., Disp: 60 tablet, Rfl: 3   clotrimazole -betamethasone  (LOTRISONE ) cream, Apply 1 Application topically daily. (Patient not taking: Reported on 04/27/2024), Disp: 45 g, Rfl:    meclizine  (ANTIVERT ) 25 MG tablet, Take 1 tablet (25 mg total) by mouth 3 (three) times daily as needed for dizziness. (Patient not taking: Reported on 04/27/2024), Disp: 30 tablet, Rfl: 0  Current Facility-Administered Medications:    medroxyPROGESTERone  (DEPO-PROVERA ) injection 150 mg, 150 mg, Intramuscular, Once, Comer Kirsch, PA-C   Review of Systems  Per HPI unless specifically indicated  above     Objective:    BP (!) 93/53   Pulse 70   Temp (!) 97.1 F (36.2 C)   Ht 5' (1.524 m)   SpO2 96%   BMI 32.91 kg/m   Wt Readings from Last 3 Encounters:  02/02/24 168 lb 8 oz (76.4 kg)  11/11/23 167 lb 8 oz (76 kg)  08/18/23 162 lb (73.5 kg)    Physical Exam Vitals reviewed.  Constitutional:      General: She is not in acute distress.    Appearance: She is well-developed. She is not toxic-appearing.  HENT:     Head: Normocephalic and atraumatic.  Cardiovascular:     Rate and Rhythm: Normal rate and regular rhythm.     Pulses:          Dorsalis pedis pulses are 2+ on the right side and 2+ on the left side.  Pulmonary:     Effort: Pulmonary effort is normal.     Breath sounds: Normal breath sounds.  Abdominal:     General: Bowel sounds are normal.     Palpations: Abdomen is soft. There is no mass.     Tenderness: There is no abdominal tenderness.  Musculoskeletal:     Right wrist: No swelling, tenderness or bony tenderness. Normal range of motion. Normal pulse.     Left wrist: Tenderness present. No swelling or bony tenderness. Normal range of motion. Normal pulse.  Right hand: No swelling or tenderness. Normal range of motion. Normal strength. Normal sensation. Normal capillary refill. Normal pulse.     Left hand: No swelling or tenderness. Normal range of motion. Normal strength. Normal sensation. Normal capillary refill. Normal pulse.     Cervical back: Neck supple.     Right lower leg: No edema.     Left lower leg: No edema.     Comments: Very mildly positive tinel's on left.  Phalen negative bilaterally  Feet:     Comments: Skin peeling mildly.  Tinea B Lymphadenopathy:     Cervical: No cervical adenopathy.  Skin:    General: Skin is warm and dry.  Neurological:     Mental Status: She is alert and oriented to person, place, and time.     Motor: No weakness or tremor.  Psychiatric:        Behavior: Behavior normal.           Assessment &  Plan:   Encounter Diagnoses  Name Primary?   Encounter for management and injection of injectable progestin contraceptive Yes   Anxiety and depression    Tinea pedis of both feet    Carpal tunnel syndrome of left wrist      Contraceptive maintenance -Depo-provera  was given by nurse in office today  HCM -Pt has already gotten her flu shot this year  carpal tunnel - pt was counseled -she has splint from previous flare and is encouraged to wear it every night.  - Ice 2-3 times/day.  - Aleve  for next 2 or 3 weeks.  She is to contact office if it fails to improve  Anxiety/depression -continue sertraline  and [facility]  Tinea pedis -OTC clotrimazole   Pt to RTO 12 weeks.  She is to contact office sooner prn

## 2024-05-03 ENCOUNTER — Ambulatory Visit: Payer: Self-pay

## 2024-05-03 DIAGNOSIS — F419 Anxiety disorder, unspecified: Secondary | ICD-10-CM

## 2024-05-03 NOTE — Progress Notes (Signed)
 This is session #25 with Samantha Khan for individual behavioral health services for anxiety and depression. Patient presents alone. BHP spent 50 minutes with patient.    Patient reports anxious symptoms including racing thoughts, irritability, and feelings of overwhelm. Pt names depressive symptoms including increased crying, hopelessness, and low self-worth. Symptoms are related to environmental barriers and interpersonal stressors. Patient will likely benefit from individual services with strengths-based intervention and coping skill development..    This procedure has been fully reviewed with the patient and informed consent has been obtained.   Patient location: Pt seen in clinic    ASSESSMENT AND PLAN   Current diagnosis:  anxiety and depression   We created the following treatment plan at today's appointment:               1)  05/03/24               2)  Decrease anxious and depressive symptoms through self awareness, coping skills, self-care, and supports, and increase overall level of life satisfaction and well-being.                3)  Medication management with Provider. On 08/12/22, pt shared that dose of sertraline  was increased from 5mg  to 10mg , and felt that had been an improvement to her mood overall. On 12/09/2022, pt requested an increase with Provider. On 02/03/23 asked Provider about adjusting medication due to headaches. 06/23/23 pt requested increase in sertraline .               4)  Next screening due in 0 session.     HISTORY OF PRESENT ILLNESS   Mental Status: [name] presented oriented X4. Mood and affect were anxious and depressed. Judgment was appropriate. Memory was appropriate. Thought processes and content were appropriate. Patient denies SI/HI.     Narrative: Met with pt for scheduled appointment. Person-centered approach used to continue therapeutic alliance. Pt provided updates since previous visit including escalated interpersonal conflict with sadness and anger.  Provided support to explore situation, noting themes of hurt, grief, respect, and control. Highlighted triggers to encourage reflection in the moment to step away as needed. Additional stressors named around family changes and health concerns. Provided handout in Spanish of coping strategies. Pt expressed gratitude around meeting in-person today and understanding that there will be a pause of about 3 months in services due to providers medical leave. Plan to provide educational materials for pt around holiday stress next visit. Assessed for additional concerns pt wanted to address which they denied. Scheduled follow up.   Effectiveness of the intervention(s) and the beneficiary's response or progress toward goal(s):  Patient is in a contemplative stage of change to engage in treatment plan

## 2024-05-24 ENCOUNTER — Ambulatory Visit: Payer: Self-pay

## 2024-05-24 DIAGNOSIS — F419 Anxiety disorder, unspecified: Secondary | ICD-10-CM

## 2024-05-24 NOTE — Progress Notes (Signed)
 This is session #26 with Samantha Khan for individual behavioral health services for anxiety and depression. Patient presents alone. BHP spent 50 minutes with patient.    Patient reports anxious symptoms including racing thoughts, irritability, and feelings of overwhelm. Pt names depressive symptoms including increased crying, hopelessness, and low self-worth. Symptoms are related to environmental barriers and interpersonal stressors. Patient will likely benefit from individual services with strengths-based intervention and coping skill development..    This procedure has been fully reviewed with the patient and informed consent has been obtained.   Patient location: Pt seen in clinic through telehealth.  I connected with  Samantha Khan on 05/24/24 by a video enabled telemedicine application and verified that I am speaking with the correct person.   I discussed the limitations of evaluation and management by telemedicine. The patient expressed understanding and agreed to proceed.    ASSESSMENT AND PLAN   Current diagnosis:  anxiety and depression   We created the following treatment plan at today's appointment:               1)  05/24/24               2)  Decrease anxious and depressive symptoms through self awareness, coping skills, self-care, and supports, and increase overall level of life satisfaction and well-being.                3)  Medication management with Provider. On 08/12/22, pt shared that dose of sertraline  was increased from 5mg  to 10mg , and felt that had been an improvement to her mood overall. On 12/09/2022, pt requested an increase with Provider. On 02/03/23 asked Provider about adjusting medication due to headaches. 06/23/23 pt requested increase in sertraline .               4)  Next screening due in 0 session.     HISTORY OF PRESENT ILLNESS   Mental Status: Samantha Khan presented oriented X4. Mood and affect were anxious and depressed. Judgment was appropriate. Memory was appropriate.  Thought processes and content were appropriate. Patient denies SI/HI.     Narrative: Met with pt for scheduled appointment. Person-centered approach used to continue therapeutic alliance. Pt provided updates since previous visit including increased stress and sadness tied to acute stressors. Pt describes worries and creates plan to increase communication around upcoming interpersonal stressor. Pt demonstrated increased self-awareness and noticing stress level. Highlighted copinig skills to pause or set timer on worries. Normalized experience and affirmed moment of success. Pt expressed gratitude and understanding that there will be a pause of about 3 months in services due to providers medical leave but that she may contact the clinic for support as needed. Assessed for additional concerns pt wanted to address which they denied.   Effectiveness of the intervention(s) and the beneficiary's response or progress toward goal(s):  Patient is in a contemplative stage of change to engage in treatment plan

## 2024-07-12 ENCOUNTER — Other Ambulatory Visit: Payer: Self-pay | Admitting: Physician Assistant

## 2024-07-12 MED ORDER — SERTRALINE HCL 100 MG PO TABS: 100.0000 mg | ORAL_TABLET | Freq: Two times a day (BID) | ORAL | 0 refills | Status: DC

## 2024-07-19 ENCOUNTER — Ambulatory Visit: Payer: Self-pay | Admitting: Physician Assistant

## 2024-07-25 ENCOUNTER — Ambulatory Visit: Payer: Self-pay

## 2024-07-25 ENCOUNTER — Ambulatory Visit: Payer: Self-pay | Admitting: Physician Assistant

## 2024-07-26 ENCOUNTER — Encounter: Payer: Self-pay | Admitting: Physician Assistant

## 2024-07-26 ENCOUNTER — Ambulatory Visit: Payer: Self-pay | Admitting: Physician Assistant

## 2024-07-26 VITALS — BP 90/66 | HR 77 | Temp 97.5°F | Ht 60.0 in | Wt 168.0 lb

## 2024-07-26 DIAGNOSIS — F419 Anxiety disorder, unspecified: Secondary | ICD-10-CM

## 2024-07-26 DIAGNOSIS — G5602 Carpal tunnel syndrome, left upper limb: Secondary | ICD-10-CM

## 2024-07-26 DIAGNOSIS — Z3042 Encounter for surveillance of injectable contraceptive: Secondary | ICD-10-CM

## 2024-07-26 MED ORDER — NAPROXEN 500 MG PO TABS: 500.0000 mg | ORAL_TABLET | Freq: Two times a day (BID) | ORAL | 1 refills | Status: AC

## 2024-07-26 MED ORDER — MEDROXYPROGESTERONE ACETATE 150 MG/ML IM SUSP
150.0000 mg | Freq: Once | INTRAMUSCULAR | Status: AC
  Administered 2024-07-26: 150 mg via INTRAMUSCULAR

## 2024-07-26 NOTE — Progress Notes (Signed)
 "  BP 90/66   Pulse 77   Temp (!) 97.5 F (36.4 C)   Ht 5' (1.524 m)   Wt 168 lb (76.2 kg)   SpO2 98%   BMI 32.81 kg/m    Subjective:    Patient ID: Samantha Khan, female    DOB: August 24, 1990, 34 y.o.   MRN: [id number]  HPI: Samantha Khan is a 34 y.o. female presenting on 07/26/2024 for Contraception, Mental Health Problem, Nasal Congestion (For the past 2 weeks. Pt has phlegm, and is still with some cough, and diarrhea (last episode on Sunday)  No Fever. Pt has taken dayquil capsules which have helped.), and Hand Pain (L hand pain and numbness. Pt dropped several items on Saturday when she thought had a good grip on them. Pt states her thumb and index finger are numb throughout the day and makes it difficult for her to pick up smaller items. Pt states the pain is mostly at night. Pt states she has been following the recommendations: using a splint every night and is applying ice 2-3 minutes once or twice a day.)   HPI  Chief Complaint  Patient presents with   Contraception   Mental Health Problem   Nasal Congestion    For the past 2 weeks. Pt has phlegm, and is still with some cough, and diarrhea (last episode on Sunday)  No Fever. Pt has taken dayquil capsules which have helped.   Hand Pain    L hand pain and numbness. Pt dropped several items on Saturday when she thought had a good grip on them. Pt states her thumb and index finger are numb throughout the day and makes it difficult for her to pick up smaller items. Pt states the pain is mostly at night. Pt states she has been following the recommendations: using a splint every night and is applying ice 2-3 minutes once or twice a day.    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  She says the sertraline  continues to help her mood.  She still has some anxiety.  She continues with [facility] which she says helps.  She denies SI, HI.      Relevant past medical, surgical, family and social history reviewed and updated as indicated. Interim medical history since our last visit reviewed. Allergies and medications reviewed and updated.   Current Outpatient Medications:    medroxyPROGESTERone  (DEPO-PROVERA ) 150 MG/ML injection, Inject 150 mg into the muscle every 3 (three) months., Disp: , Rfl:    Prenatal Vit-Fe Fumarate-FA (PRENATAL VITAMIN PO), Take by mouth., Disp: , Rfl:    sertraline  (ZOLOFT ) 100 MG tablet, Take 1 tablet (100 mg total) by mouth 2 (two) times daily., Disp: 60 tablet, Rfl: 0     Review of Systems  Per HPI unless specifically indicated above     Objective:    BP 90/66   Pulse 77   Temp (!) 97.5 F (36.4 C)   Ht 5' (1.524 m)   Wt 168 lb (76.2 kg)   SpO2 98%   BMI 32.81 kg/m   Wt Readings from Last 3 Encounters:  07/26/24 168 lb (76.2 kg)  04/27/24 172 lb (78 kg)  02/02/24 168 lb 8 oz (76.4 kg)    Physical Exam Constitutional:      General: She is not in acute distress.    Appearance: She is not ill-appearing or toxic-appearing.  HENT:  Head: Normocephalic and atraumatic.  Pulmonary:     Effort: Pulmonary effort is normal. No respiratory distress.  Musculoskeletal:     Right wrist: Normal.     Left wrist: Tenderness present. No swelling or deformity. Normal range of motion. Normal pulse.     Comments: + tinel & phalen  Skin:    General: Skin is warm and dry.  Neurological:     Mental Status: She is alert and oriented to person, place, and time.     Motor: No weakness or tremor.           Assessment & Plan:    Encounter Diagnoses  Name Primary?   Anxiety and depression Yes   Encounter for management and injection of injectable progestin contraceptive    Carpal tunnel syndrome of left wrist      -pt to Continue to wear splint every night and ice 10-15 minutes tid  and add naproxen  -pt to continue with [facility] and sertraline  -depo-provera  given by nurse -pt to  follow up 3 months. She is to contact office sooner prn "

## 2024-07-27 ENCOUNTER — Ambulatory Visit: Payer: Self-pay | Admitting: Physician Assistant

## 2024-08-08 ENCOUNTER — Other Ambulatory Visit: Payer: Self-pay | Admitting: Physician Assistant

## 2024-08-08 ENCOUNTER — Ambulatory Visit: Payer: Self-pay

## 2024-08-29 ENCOUNTER — Ambulatory Visit: Payer: Self-pay

## 2024-10-18 ENCOUNTER — Ambulatory Visit: Payer: Self-pay | Admitting: Physician Assistant
